# Patient Record
Sex: Female | Born: 1960 | Race: White | Hispanic: No | Marital: Single | Smoking: Current every day smoker
Health system: Southern US, Community
[De-identification: ages and names within clinical notes are randomized; demographics above are authoritative.]

## PROBLEM LIST (undated history)

## (undated) DIAGNOSIS — K219 Gastro-esophageal reflux disease without esophagitis: Secondary | ICD-10-CM

## (undated) DIAGNOSIS — D649 Anemia, unspecified: Secondary | ICD-10-CM

## (undated) DIAGNOSIS — Z8719 Personal history of other diseases of the digestive system: Secondary | ICD-10-CM

## (undated) DIAGNOSIS — J189 Pneumonia, unspecified organism: Secondary | ICD-10-CM

## (undated) DIAGNOSIS — J939 Pneumothorax, unspecified: Secondary | ICD-10-CM

## (undated) DIAGNOSIS — R079 Chest pain, unspecified: Secondary | ICD-10-CM

## (undated) DIAGNOSIS — F32A Depression, unspecified: Secondary | ICD-10-CM

## (undated) DIAGNOSIS — Z972 Presence of dental prosthetic device (complete) (partial): Secondary | ICD-10-CM

## (undated) DIAGNOSIS — I499 Cardiac arrhythmia, unspecified: Secondary | ICD-10-CM

## (undated) DIAGNOSIS — M1990 Unspecified osteoarthritis, unspecified site: Secondary | ICD-10-CM

## (undated) DIAGNOSIS — J40 Bronchitis, not specified as acute or chronic: Secondary | ICD-10-CM

## (undated) DIAGNOSIS — J449 Chronic obstructive pulmonary disease, unspecified: Secondary | ICD-10-CM

## (undated) DIAGNOSIS — R519 Headache, unspecified: Secondary | ICD-10-CM

## (undated) DIAGNOSIS — R011 Cardiac murmur, unspecified: Secondary | ICD-10-CM

## (undated) HISTORY — PX: LUNG LOBECTOMY: SHX167

## (undated) HISTORY — PX: OOPHORECTOMY: SHX86

## (undated) HISTORY — PX: ROTATOR CUFF REPAIR: SHX139

## (undated) HISTORY — PX: COLONOSCOPY: SHX174

## (undated) HISTORY — PX: APPENDECTOMY: SHX54

## (undated) HISTORY — PX: SHOULDER ARTHROSCOPY: SHX128

## (undated) HISTORY — DX: Chest pain, unspecified: R07.9

---

## 2016-12-03 ENCOUNTER — Other Ambulatory Visit: Payer: Self-pay | Admitting: Pain Medicine

## 2016-12-04 ENCOUNTER — Other Ambulatory Visit: Payer: Self-pay | Admitting: Pain Medicine

## 2016-12-04 DIAGNOSIS — G587 Mononeuritis multiplex: Secondary | ICD-10-CM

## 2016-12-04 DIAGNOSIS — G609 Hereditary and idiopathic neuropathy, unspecified: Secondary | ICD-10-CM

## 2016-12-10 ENCOUNTER — Ambulatory Visit
Admission: RE | Admit: 2016-12-10 | Discharge: 2016-12-10 | Disposition: A | Discharge status: Home / Self Care | Payer: Medicare Other | Source: Ambulatory Visit | Attending: Pain Medicine | Admitting: Pain Medicine

## 2016-12-10 ENCOUNTER — Encounter (INDEPENDENT_AMBULATORY_CARE_PROVIDER_SITE_OTHER): Payer: Self-pay

## 2016-12-10 DIAGNOSIS — G587 Mononeuritis multiplex: Secondary | ICD-10-CM | POA: Diagnosis present

## 2016-12-10 DIAGNOSIS — G609 Hereditary and idiopathic neuropathy, unspecified: Secondary | ICD-10-CM | POA: Diagnosis not present

## 2017-04-15 ENCOUNTER — Other Ambulatory Visit: Payer: Self-pay | Admitting: Family Medicine

## 2017-04-15 DIAGNOSIS — Z720 Tobacco use: Secondary | ICD-10-CM

## 2017-05-01 ENCOUNTER — Ambulatory Visit: Payer: Medicare Other

## 2017-10-06 ENCOUNTER — Other Ambulatory Visit: Payer: Self-pay

## 2017-10-06 ENCOUNTER — Emergency Department
Admission: EM | Admit: 2017-10-06 | Discharge: 2017-10-06 | Disposition: A | Discharge status: Home / Self Care | Payer: Medicare Other | Attending: Student in an Organized Health Care Education/Training Program | Admitting: Student in an Organized Health Care Education/Training Program

## 2017-10-06 ENCOUNTER — Emergency Department: Payer: Medicare Other

## 2017-10-06 ENCOUNTER — Encounter: Payer: Self-pay | Admitting: Emergency Medicine

## 2017-10-06 DIAGNOSIS — R509 Fever, unspecified: Secondary | ICD-10-CM | POA: Diagnosis not present

## 2017-10-06 DIAGNOSIS — J209 Acute bronchitis, unspecified: Secondary | ICD-10-CM | POA: Insufficient documentation

## 2017-10-06 DIAGNOSIS — R05 Cough: Secondary | ICD-10-CM | POA: Diagnosis present

## 2017-10-06 DIAGNOSIS — R0602 Shortness of breath: Secondary | ICD-10-CM | POA: Insufficient documentation

## 2017-10-06 DIAGNOSIS — J189 Pneumonia, unspecified organism: Secondary | ICD-10-CM | POA: Insufficient documentation

## 2017-10-06 DIAGNOSIS — R110 Nausea: Secondary | ICD-10-CM | POA: Diagnosis not present

## 2017-10-06 LAB — CBC WITH DIFFERENTIAL/PLATELET
Basophils Absolute: 0 10*3/uL (ref 0–0.1)
Basophils Relative: 0 %
EOS ABS: 0 10*3/uL (ref 0–0.7)
Eosinophils Relative: 0 %
HCT: 31.6 % — ABNORMAL LOW (ref 35.0–47.0)
Hemoglobin: 10.8 g/dL — ABNORMAL LOW (ref 12.0–16.0)
LYMPHS ABS: 0.7 10*3/uL — AB (ref 1.0–3.6)
LYMPHS PCT: 6 %
MCH: 26.5 pg (ref 26.0–34.0)
MCHC: 34 g/dL (ref 32.0–36.0)
MCV: 78 fL — AB (ref 80.0–100.0)
Monocytes Absolute: 0.8 10*3/uL (ref 0.2–0.9)
Monocytes Relative: 7 %
NEUTROS ABS: 10.3 10*3/uL — AB (ref 1.4–6.5)
NEUTROS PCT: 87 %
PLATELETS: 283 10*3/uL (ref 150–440)
RBC: 4.05 MIL/uL (ref 3.80–5.20)
RDW: 18.6 % — AB (ref 11.5–14.5)
WBC: 11.9 10*3/uL — AB (ref 3.6–11.0)

## 2017-10-06 LAB — COMPREHENSIVE METABOLIC PANEL
ALT: 13 U/L (ref 0–44)
AST: 32 U/L (ref 15–41)
Albumin: 3.6 g/dL (ref 3.5–5.0)
Alkaline Phosphatase: 116 U/L (ref 38–126)
Anion gap: 15 (ref 5–15)
BUN: 11 mg/dL (ref 6–20)
CHLORIDE: 95 mmol/L — AB (ref 98–111)
CO2: 26 mmol/L (ref 22–32)
CREATININE: 0.86 mg/dL (ref 0.44–1.00)
Calcium: 9.4 mg/dL (ref 8.9–10.3)
Glucose, Bld: 123 mg/dL — ABNORMAL HIGH (ref 70–99)
POTASSIUM: 3.8 mmol/L (ref 3.5–5.1)
SODIUM: 136 mmol/L (ref 135–145)
Total Bilirubin: 1.7 mg/dL — ABNORMAL HIGH (ref 0.3–1.2)
Total Protein: 7.4 g/dL (ref 6.5–8.1)

## 2017-10-06 MED ORDER — IPRATROPIUM-ALBUTEROL 0.5-2.5 (3) MG/3ML IN SOLN
3.0000 mL | Freq: Once | RESPIRATORY_TRACT | Status: AC
  Administered 2017-10-06: 3 mL via RESPIRATORY_TRACT
  Filled 2017-10-06: qty 3

## 2017-10-06 MED ORDER — SODIUM CHLORIDE 0.9 % IV BOLUS
1000.0000 mL | Freq: Once | INTRAVENOUS | Status: AC
  Administered 2017-10-06: 1000 mL via INTRAVENOUS

## 2017-10-06 MED ORDER — LEVOFLOXACIN 500 MG PO TABS: 500.0000 mg | ORAL_TABLET | Freq: Every day | ORAL | 0 refills | Status: AC

## 2017-10-06 MED ORDER — IBUPROFEN 600 MG PO TABS
600.0000 mg | ORAL_TABLET | Freq: Once | ORAL | Status: AC
  Administered 2017-10-06: 600 mg via ORAL
  Filled 2017-10-06: qty 1

## 2017-10-06 MED ORDER — IPRATROPIUM-ALBUTEROL 0.5-2.5 (3) MG/3ML IN SOLN: 3.0000 mL | RESPIRATORY_TRACT | 3 refills | Status: DC | PRN

## 2017-10-06 MED ORDER — LEVOFLOXACIN IN D5W 750 MG/150ML IV SOLN
750.0000 mg | Freq: Once | INTRAVENOUS | Status: AC
  Administered 2017-10-06: 750 mg via INTRAVENOUS
  Filled 2017-10-06: qty 150

## 2017-10-06 MED ORDER — PREDNISONE 20 MG PO TABS
60.0000 mg | ORAL_TABLET | Freq: Once | ORAL | Status: AC
  Administered 2017-10-06: 60 mg via ORAL
  Filled 2017-10-06: qty 3

## 2017-10-06 MED ORDER — PREDNISONE 20 MG PO TABS: 40.0000 mg | ORAL_TABLET | Freq: Every day | ORAL | 0 refills | Status: AC

## 2017-10-06 MED ORDER — LEVOFLOXACIN IN D5W 750 MG/150ML IV SOLN: 750.0000 mg | INTRAVENOUS | Status: DC

## 2017-10-06 NOTE — ED Provider Notes (Signed)
[facility] Emergency Department Provider Note    First MD Initiated Contact with Patient 10/06/17 1931     (approximate)  I have reviewed the triage vital signs and the nursing notes.   HISTORY  Chief Complaint Cough and Fever    HPI Tammy Maxwell is a 57 y.o. female with a history of bronchitis and 1 pack/day smoking history presents the ER for evaluation of thick productive cough and shortness of breath.  Has not had any vomiting.  Has had some nausea.  No diarrhea.  Does not wear home oxygen.  She is been able to tolerate oral hydration.  No recent antibiotics.  She ran out of nebulizer treatments several days ago.  No recent steroids.  Has had multiple recent sick contacts.    History reviewed. No pertinent past medical history. No family history on file. History reviewed. No pertinent surgical history. There are no active problems to display for this patient.     Prior to Admission medications   Medication Sig Start Date End Date Taking? Authorizing Provider  ipratropium-albuterol (DUONEB) 0.5-2.5 (3) MG/3ML SOLN Take 3 mLs by nebulization every 4 (four) hours as needed. 10/06/17   Willy Eddy, MD  levofloxacin (LEVAQUIN) 500 MG tablet Take 1 tablet (500 mg total) by mouth daily for 7 days. 10/06/17 10/13/17  Willy Eddy, MD  predniSONE (DELTASONE) 20 MG tablet Take 2 tablets (40 mg total) by mouth daily for 5 days. 10/06/17 10/11/17  Willy Eddy, MD    Allergies Penicillins    Social History Social History   Tobacco Use  . Smoking status: Not on file  Substance Use Topics  . Alcohol use: Not on file  . Drug use: Not on file    Review of Systems Patient denies headaches, rhinorrhea, blurry vision, numbness, shortness of breath, chest pain, edema, cough, abdominal pain, nausea, vomiting, diarrhea, dysuria, fevers, rashes or hallucinations unless otherwise stated above in  HPI. ____________________________________________   PHYSICAL EXAM:  VITAL SIGNS: Vitals:   10/06/17 1930 10/06/17 2000  BP: (!) 147/79 136/76  Pulse:    Resp: (!) 24 (!) 23  Temp:    SpO2:      Constitutional: Alert and oriented.  Eyes: Conjunctivae are normal.  Head: Atraumatic. Nose: No congestion/rhinnorhea. Mouth/Throat: Mucous membranes are moist.   Neck: No stridor. Painless ROM.  Cardiovascular: Normal rate, regular rhythm. Grossly normal heart sounds.  Good peripheral circulation. Respiratory: mild tachypnea with diffuse rhonchorus breathsounds, expiratory wheeze Gastrointestinal: Soft and nontender. No distention. No abdominal bruits. No CVA tenderness. Genitourinary: deferred Musculoskeletal: No lower extremity tenderness nor edema.  No joint effusions. Neurologic:  Normal speech and language. No gross focal neurologic deficits are appreciated. No facial droop Skin:  Skin is warm, dry and intact. No rash noted. Psychiatric: Mood and affect are normal. Speech and behavior are normal.  ____________________________________________   LABS (all labs ordered are listed, but only abnormal results are displayed)  Results for orders placed or performed during the hospital encounter of 10/06/17 (from the past 24 hour(s))  Comprehensive metabolic panel     Status: Abnormal   Collection Time: 10/06/17  4:37 PM  Result Value Ref Range   Sodium 136 135 - 145 mmol/L   Potassium 3.8 3.5 - 5.1 mmol/L   Chloride 95 (L) 98 - 111 mmol/L   CO2 26 22 - 32 mmol/L   Glucose, Bld 123 (H) 70 - 99 mg/dL   BUN 11 6 - 20 mg/dL   Creatinine, Ser  0.86 0.44 - 1.00 mg/dL   Calcium 9.4 8.9 - 41.3 mg/dL   Total Protein 7.4 6.5 - 8.1 g/dL   Albumin 3.6 3.5 - 5.0 g/dL   AST 32 15 - 41 U/L   ALT 13 0 - 44 U/L   Alkaline Phosphatase 116 38 - 126 U/L   Total Bilirubin 1.7 (H) 0.3 - 1.2 mg/dL   GFR calc non Af Amer >60 >60 mL/min   GFR calc Af Amer >60 >60 mL/min   Anion gap 15 5 - 15  CBC  with Differential     Status: Abnormal   Collection Time: 10/06/17  4:37 PM  Result Value Ref Range   WBC 11.9 (H) 3.6 - 11.0 K/uL   RBC 4.05 3.80 - 5.20 MIL/uL   Hemoglobin 10.8 (L) 12.0 - 16.0 g/dL   HCT 24.4 (L) 01.0 - 27.2 %   MCV 78.0 (L) 80.0 - 100.0 fL   MCH 26.5 26.0 - 34.0 pg   MCHC 34.0 32.0 - 36.0 g/dL   RDW 53.6 (H) 64.4 - 03.4 %   Platelets 283 150 - 440 K/uL   Neutrophils Relative % 87 %   Neutro Abs 10.3 (H) 1.4 - 6.5 K/uL   Lymphocytes Relative 6 %   Lymphs Abs 0.7 (L) 1.0 - 3.6 K/uL   Monocytes Relative 7 %   Monocytes Absolute 0.8 0.2 - 0.9 K/uL   Eosinophils Relative 0 %   Eosinophils Absolute 0.0 0 - 0.7 K/uL   Basophils Relative 0 %   Basophils Absolute 0.0 0 - 0.1 K/uL   ____________________________________________  EKG My review and personal interpretation at Time: 21:32   Indication: sob  Rate: 95  Rhythm: sinus Axis: normal Other: normal intervals, no stemi, non specific st abn ____________________________________________  RADIOLOGY  I personally reviewed all radiographic images ordered to evaluate for the above acute complaints and reviewed radiology reports and findings.  These findings were personally discussed with the patient.  Please see medical record for radiology report.  ____________________________________________   PROCEDURES  Procedure(s) performed:  Procedures    Critical Care performed: no ____________________________________________   INITIAL IMPRESSION / ASSESSMENT AND PLAN / ED COURSE  Pertinent labs & imaging results that were available during my care of the patient were reviewed by me and considered in my medical decision making (see chart for details).   DDX: Asthma, copd, CHF, pna, ptx, malignancy, Pe, anemia   Tammy Maxwell is a 57 y.o. who presents to the ED with cough and symptoms as described above.  Patient with low-grade temperature mild tachycardia but no significant hypoxia.  Given respiratory sounds  certainly concerning for pneumonia chest x-ray does show probable early bronchopneumonia.  Not consistent with PE.  Patient will be given steroids as well as nebulizer treatment and reassess.  No evidence of heart failure.  We will give IV fluids.  Patient with poor score of 45.  The patient will be placed on continuous pulse oximetry and telemetry for monitoring.  Laboratory evaluation will be sent to evaluate for the above complaints.     Clinical Course as of Oct 07 2134  Mon Oct 06, 2017  2120 Patient reassessed.  Feels much improved after nebulizer treatment.  Recheck temperature and she is elevated to 101.2.  Will give Motrin and reassess.  Patient tolerating oral hydration I do believe she will be stable and appropriate for outpatient follow-up.  Was ambulated and did not develop any hypoxia or shortness of breath.  States  that she feels much improved to be discharged home.  Encouraged her to stay for repeat vitals to ensure that her fever curve is downtrending.   [PR]    Clinical Course User Index [PR] Willy Eddy, MD     As part of my medical decision making, I reviewed the following data within the electronic MEDICAL RECORD NUMBER Nursing notes reviewed and incorporated, Labs reviewed, notes from prior ED visits.  ____________________________________________   FINAL CLINICAL IMPRESSION(S) / ED DIAGNOSES  Final diagnoses:  Community acquired pneumonia, unspecified laterality  Acute bronchitis, unspecified organism      NEW MEDICATIONS STARTED DURING THIS VISIT:  New Prescriptions   IPRATROPIUM-ALBUTEROL (DUONEB) 0.5-2.5 (3) MG/3ML SOLN    Take 3 mLs by nebulization every 4 (four) hours as needed.   LEVOFLOXACIN (LEVAQUIN) 500 MG TABLET    Take 1 tablet (500 mg total) by mouth daily for 7 days.   PREDNISONE (DELTASONE) 20 MG TABLET    Take 2 tablets (40 mg total) by mouth daily for 5 days.     Note:  This document was prepared using Dragon voice recognition software and  may include unintentional dictation errors.    Willy Eddy, MD 10/06/17 2136

## 2017-10-06 NOTE — Consult Note (Signed)
Pharmacy Antibiotic Note  Tammy Maxwell is a 57 y.o. female admitted on 10/06/2017 with pneumonia.  Pharmacy has been consulted for levofloxacin dosing.  Plan: Levofloxacin 750 mg IV every 24 hours.  Height: 5\' 8"  (172.7 cm) Weight: 116 lb (52.6 kg) IBW/kg (Calculated) : 63.9  Temp (24hrs), Avg:99.9 F (37.7 C), Min:99.9 F (37.7 C), Max:99.9 F (37.7 C)  Recent Labs  Lab 10/06/17 1637  WBC 11.9*  CREATININE 0.86    Estimated Creatinine Clearance: 61.4 mL/min (by C-G formula based on SCr of 0.86 mg/dL).    Allergies  Allergen Reactions  . Penicillins Anaphylaxis    Antimicrobials this admission: Levofloxacin 9/30 >>    Dose adjustments this admission:   Microbiology results:  BCx:   UCx:    Sputum:    MRSA PCR:   Thank you for allowing pharmacy to be a part of this patient's care.  Orinda Kenner 10/06/2017 8:13 PM

## 2017-10-06 NOTE — ED Triage Notes (Addendum)
Cough and fever x 9 days. Cough productive green.

## 2017-10-06 NOTE — ED Notes (Signed)
Pt reports a cough, intermittent fever.  cig smoker.  States coughing up yellow phlegm.  Pt also reports sob.  Pt also has chest pain.  States nausea.  Sx for 10 days.

## 2017-10-06 NOTE — ED Notes (Signed)
Pt drinking water.  Oxygen sats 93-94% with ambulation.

## 2019-01-05 ENCOUNTER — Ambulatory Visit: Payer: Medicare Other | Attending: Internal Medicine

## 2019-01-05 DIAGNOSIS — Z20822 Contact with and (suspected) exposure to covid-19: Secondary | ICD-10-CM

## 2019-01-06 LAB — NOVEL CORONAVIRUS, NAA: SARS-CoV-2, NAA: NOT DETECTED

## 2019-01-07 ENCOUNTER — Telehealth: Payer: Self-pay

## 2019-01-07 NOTE — Telephone Encounter (Signed)
Caller given negative result and verbalized understanding  

## 2019-08-11 ENCOUNTER — Encounter: Payer: Self-pay | Admitting: Ophthalmology

## 2019-08-17 ENCOUNTER — Other Ambulatory Visit: Payer: Self-pay

## 2019-08-17 ENCOUNTER — Other Ambulatory Visit
Admission: RE | Admit: 2019-08-17 | Discharge: 2019-08-17 | Disposition: A | Discharge status: Home / Self Care | Payer: Medicare Other | Source: Ambulatory Visit | Attending: Ophthalmology | Admitting: Ophthalmology

## 2019-08-17 DIAGNOSIS — Z01812 Encounter for preprocedural laboratory examination: Secondary | ICD-10-CM | POA: Diagnosis present

## 2019-08-17 DIAGNOSIS — Z20822 Contact with and (suspected) exposure to covid-19: Secondary | ICD-10-CM | POA: Diagnosis not present

## 2019-08-17 LAB — SARS CORONAVIRUS 2 (TAT 6-24 HRS): SARS Coronavirus 2: NEGATIVE

## 2019-08-19 ENCOUNTER — Ambulatory Visit: Payer: Medicare Other | Admitting: Certified Registered"

## 2019-08-19 ENCOUNTER — Other Ambulatory Visit: Payer: Self-pay

## 2019-08-19 ENCOUNTER — Encounter
Admission: RE | Disposition: A | Discharge status: Home / Self Care | Payer: Self-pay | Source: Home / Self Care | Attending: Ophthalmology

## 2019-08-19 ENCOUNTER — Ambulatory Visit
Admission: RE | Admit: 2019-08-19 | Discharge: 2019-08-19 | Disposition: A | Discharge status: Home / Self Care | Payer: Medicare Other | Attending: Ophthalmology | Admitting: Ophthalmology

## 2019-08-19 ENCOUNTER — Encounter: Payer: Self-pay | Admitting: Ophthalmology

## 2019-08-19 DIAGNOSIS — F329 Major depressive disorder, single episode, unspecified: Secondary | ICD-10-CM | POA: Insufficient documentation

## 2019-08-19 DIAGNOSIS — Z880 Allergy status to penicillin: Secondary | ICD-10-CM | POA: Insufficient documentation

## 2019-08-19 DIAGNOSIS — J449 Chronic obstructive pulmonary disease, unspecified: Secondary | ICD-10-CM | POA: Diagnosis not present

## 2019-08-19 DIAGNOSIS — Z7982 Long term (current) use of aspirin: Secondary | ICD-10-CM | POA: Diagnosis not present

## 2019-08-19 DIAGNOSIS — Z902 Acquired absence of lung [part of]: Secondary | ICD-10-CM | POA: Diagnosis not present

## 2019-08-19 DIAGNOSIS — F17200 Nicotine dependence, unspecified, uncomplicated: Secondary | ICD-10-CM | POA: Insufficient documentation

## 2019-08-19 DIAGNOSIS — K219 Gastro-esophageal reflux disease without esophagitis: Secondary | ICD-10-CM | POA: Insufficient documentation

## 2019-08-19 DIAGNOSIS — Z9981 Dependence on supplemental oxygen: Secondary | ICD-10-CM | POA: Diagnosis not present

## 2019-08-19 DIAGNOSIS — K449 Diaphragmatic hernia without obstruction or gangrene: Secondary | ICD-10-CM | POA: Diagnosis not present

## 2019-08-19 DIAGNOSIS — H2511 Age-related nuclear cataract, right eye: Secondary | ICD-10-CM | POA: Diagnosis not present

## 2019-08-19 DIAGNOSIS — M1990 Unspecified osteoarthritis, unspecified site: Secondary | ICD-10-CM | POA: Insufficient documentation

## 2019-08-19 DIAGNOSIS — Z79899 Other long term (current) drug therapy: Secondary | ICD-10-CM | POA: Diagnosis not present

## 2019-08-19 DIAGNOSIS — Z791 Long term (current) use of non-steroidal anti-inflammatories (NSAID): Secondary | ICD-10-CM | POA: Diagnosis not present

## 2019-08-19 HISTORY — DX: Personal history of other diseases of the digestive system: Z87.19

## 2019-08-19 HISTORY — DX: Anemia, unspecified: D64.9

## 2019-08-19 HISTORY — DX: Pneumothorax, unspecified: J93.9

## 2019-08-19 HISTORY — DX: Cardiac arrhythmia, unspecified: I49.9

## 2019-08-19 HISTORY — DX: Pneumonia, unspecified organism: J18.9

## 2019-08-19 HISTORY — DX: Bronchitis, not specified as acute or chronic: J40

## 2019-08-19 HISTORY — DX: Depression, unspecified: F32.A

## 2019-08-19 HISTORY — PX: CATARACT EXTRACTION W/PHACO: SHX586

## 2019-08-19 HISTORY — DX: Unspecified osteoarthritis, unspecified site: M19.90

## 2019-08-19 HISTORY — DX: Gastro-esophageal reflux disease without esophagitis: K21.9

## 2019-08-19 HISTORY — DX: Cardiac murmur, unspecified: R01.1

## 2019-08-19 HISTORY — DX: Chronic obstructive pulmonary disease, unspecified: J44.9

## 2019-08-19 SURGERY — PHACOEMULSIFICATION, CATARACT, WITH IOL INSERTION
Anesthesia: Monitor Anesthesia Care | Site: Eye | Laterality: Right

## 2019-08-19 MED ORDER — CARBACHOL 0.01 % IO SOLN
INTRAOCULAR | Status: DC | PRN
  Administered 2019-08-19: 0.5 mL via INTRAOCULAR

## 2019-08-19 MED ORDER — MOXIFLOXACIN HCL 0.5 % OP SOLN: 1.0000 [drp] | OPHTHALMIC | Status: DC | PRN

## 2019-08-19 MED ORDER — TETRACAINE HCL 0.5 % OP SOLN
OPHTHALMIC | Status: AC
  Administered 2019-08-19: 1 [drp] via OPHTHALMIC
  Filled 2019-08-19: qty 4

## 2019-08-19 MED ORDER — MOXIFLOXACIN HCL 0.5 % OP SOLN
OPHTHALMIC | Status: AC
  Filled 2019-08-19: qty 3

## 2019-08-19 MED ORDER — SODIUM HYALURONATE 10 MG/ML IO SOLN
INTRAOCULAR | Status: DC | PRN
  Administered 2019-08-19: 0.55 mL via INTRAOCULAR

## 2019-08-19 MED ORDER — MOXIFLOXACIN HCL 0.5 % OP SOLN
OPHTHALMIC | Status: DC | PRN
  Administered 2019-08-19: 0.2 mL via OPHTHALMIC

## 2019-08-19 MED ORDER — TETRACAINE HCL 0.5 % OP SOLN
1.0000 [drp] | OPHTHALMIC | Status: AC | PRN
  Administered 2019-08-19: 1 [drp] via OPHTHALMIC

## 2019-08-19 MED ORDER — LIDOCAINE HCL (PF) 4 % IJ SOLN
INTRAOCULAR | Status: DC | PRN
  Administered 2019-08-19: 4 mL via OPHTHALMIC

## 2019-08-19 MED ORDER — ARMC OPHTHALMIC DILATING DROPS
OPHTHALMIC | Status: AC
  Administered 2019-08-19: 1 via OPHTHALMIC
  Filled 2019-08-19: qty 0.5

## 2019-08-19 MED ORDER — SODIUM HYALURONATE 23 MG/ML IO SOLN
INTRAOCULAR | Status: DC | PRN
  Administered 2019-08-19: 0.6 mL via INTRAOCULAR

## 2019-08-19 MED ORDER — ARMC OPHTHALMIC DILATING DROPS
1.0000 "application " | OPHTHALMIC | Status: AC
  Administered 2019-08-19 (×2): 1 via OPHTHALMIC

## 2019-08-19 MED ORDER — SODIUM CHLORIDE 0.9 % IV SOLN: INTRAVENOUS | Status: DC

## 2019-08-19 MED ORDER — DEXMEDETOMIDINE HCL IN NACL 200 MCG/50ML IV SOLN
INTRAVENOUS | Status: DC | PRN
  Administered 2019-08-19: 20 ug via INTRAVENOUS

## 2019-08-19 MED ORDER — POVIDONE-IODINE 5 % OP SOLN
OPHTHALMIC | Status: DC | PRN
  Administered 2019-08-19: 1 via OPHTHALMIC

## 2019-08-19 MED ORDER — EPINEPHRINE PF 1 MG/ML IJ SOLN: INTRAOCULAR | Status: DC | PRN

## 2019-08-19 SURGICAL SUPPLY — 17 items
DISSECTOR HYDRO NUCLEUS 50X22 (MISCELLANEOUS) ×2 IMPLANT
GLOVE BIO SURGEON STRL SZ8 (GLOVE) ×2 IMPLANT
GLOVE BIOGEL M 6.5 STRL (GLOVE) ×2 IMPLANT
GLOVE SURG LX 7.5 STRW (GLOVE) ×1
GLOVE SURG LX STRL 7.5 STRW (GLOVE) ×1 IMPLANT
GOWN STRL REUS W/ TWL LRG LVL3 (GOWN DISPOSABLE) ×2 IMPLANT
GOWN STRL REUS W/TWL LRG LVL3 (GOWN DISPOSABLE) ×2
LABEL CATARACT MEDS ST (LABEL) ×2 IMPLANT
LENS IOL DIOP 21.5 (Intraocular Lens) ×2 IMPLANT
LENS IOL TECNIS MONO 21.5 (Intraocular Lens) ×1 IMPLANT
PACK CATARACT (MISCELLANEOUS) ×2 IMPLANT
PACK CATARACT KING (MISCELLANEOUS) ×2 IMPLANT
PACK EYE AFTER SURG (MISCELLANEOUS) ×2 IMPLANT
SOL BSS BAG (MISCELLANEOUS) ×2
SOLUTION BSS BAG (MISCELLANEOUS) ×1 IMPLANT
WATER STERILE IRR 250ML POUR (IV SOLUTION) ×2 IMPLANT
WIPE NON LINTING 3.25X3.25 (MISCELLANEOUS) ×2 IMPLANT

## 2019-08-19 NOTE — Anesthesia Postprocedure Evaluation (Signed)
Anesthesia Post Note  Patient: Tammy Maxwell  Procedure(s) Performed: CATARACT EXTRACTION PHACO AND INTRAOCULAR LENS PLACEMENT (IOC) RIGHT (Right Eye)  Patient location: Phase II. Anesthesia Type: MAC Level of consciousness: awake and alert Pain management: pain level controlled Vital Signs Assessment: post-procedure vital signs reviewed and stable Respiratory status: spontaneous breathing, nonlabored ventilation, respiratory function stable and patient connected to nasal cannula oxygen Cardiovascular status: stable and blood pressure returned to baseline Postop Assessment: no apparent nausea or vomiting Anesthetic complications: no   No complications documented.   Last Vitals:  Vitals:   08/19/19 0655 08/19/19 0857  BP: (!) 159/95 (!) 157/86  Pulse: 68 64  Resp: 18 16  Temp: 36.5 C (!) 36.4 C  SpO2: 97% 100%    Last Pain:  Vitals:   08/19/19 0857  TempSrc: Temporal  PainSc: 0-No pain                 Precious Haws Jaquelyn Sakamoto

## 2019-08-19 NOTE — H&P (Signed)

## 2019-08-19 NOTE — Discharge Instructions (Addendum)
Eye Surgery Discharge Instructions  Expect mild scratchy sensation or mild soreness. DO NOT RUB YOUR EYE!  The day of surgery: . Minimal physical activity, but bed rest is not required . No reading, computer work, or close hand work . No bending, lifting, or straining. . May watch TV  For 24 hours: . No driving, legal decisions, or alcoholic beverages . Safety precautions . Eat anything you prefer: It is better to start with liquids, then soup then solid foods. . Solar shield eyeglasses should be worn for comfort in the sunlight/patch while sleeping  Resume all regular medications including aspirin or Coumadin if these were discontinued prior to surgery. You may shower, bathe, shave, or wash your hair. Tylenol may be taken for mild discomfort. FOLLOW DR. Elmer Bales EYE DROP INSTRUCTION SHEET AS REVIEWED.  Call your doctor if you experience significant pain, nausea, or vomiting, fever > 101 or other signs of infection. [phone] or [phone] Specific instructions:   Follow-up Information    Tammy Crane, MD Follow up.   Specialty: Ophthalmology Why: 08/20/19 @ 3:10 pm [facility] OFFICE Contact information: [address] [place] [address] [phone]

## 2019-08-19 NOTE — Op Note (Signed)
OPERATIVE NOTE  Tammy Maxwell [id number] 08/19/2019   PREOPERATIVE DIAGNOSIS:  Nuclear sclerotic cataract right eye.  H25.11   POSTOPERATIVE DIAGNOSIS:    Nuclear sclerotic cataract right eye.     PROCEDURE:  Phacoemusification with posterior chamber intraocular lens placement of the right eye   LENS:   Implant Name Type Inv. Item Serial No. Manufacturer Lot No. LRB No. Used Action  LENS IOL DIOP 21.5 - [id number] Intraocular Lens LENS IOL DIOP 21.5 [id number] [employer]  Right 1 Implanted       Procedure(s) with comments: CATARACT EXTRACTION PHACO AND INTRAOCULAR LENS PLACEMENT (IOC) RIGHT (Right) - [place] 00:14.5 CDE 0.90 Fluid Pack lot # [id number]  DCB00 +21.5   ULTRASOUND TIME: 0 minutes 14 seconds.  CDE 0.90   SURGEON:  Willey Blade, MD, MPH  ANESTHESIOLOGIST: Anesthesiologist: Piscitello, Cleda Mccreedy, MD CRNA: Darliss Cheney, CRNA   ANESTHESIA:  Topical with tetracaine drops augmented with 1% preservative-free intracameral lidocaine.  ESTIMATED BLOOD LOSS: less than 1 mL.   COMPLICATIONS:  None.   DESCRIPTION OF PROCEDURE:  The patient was identified in the holding room and transported to the operating room and placed in the supine position under the operating microscope.  The right eye was identified as the operative eye and it was prepped and draped in the usual sterile ophthalmic fashion.   A 1.0 millimeter clear-corneal paracentesis was made at the 10:30 position. 0.5 ml of preservative-free 1% lidocaine with epinephrine was injected into the anterior chamber.  The anterior chamber was filled with Healon 5 viscoelastic.  A 2.4 millimeter keratome was used to make a near-clear corneal incision at the 8:00 position.  A curvilinear capsulorrhexis was made with a cystotome and capsulorrhexis forceps.  Balanced salt solution was used to hydrodissect and hydrodelineate the nucleus.   Phacoemulsification was then used in stop and chop fashion to remove the lens  nucleus and epinucleus.  The remaining cortex was then removed using the irrigation and aspiration handpiece. Healon was then placed into the capsular bag to distend it for lens placement.  A lens was then injected into the capsular bag.  The remaining viscoelastic was aspirated.   Wounds were hydrated with balanced salt solution.  The anterior chamber was inflated to a physiologic pressure with balanced salt solution.   Intracameral vigamox 0.1 mL undiluted was injected into the eye and a drop placed onto the ocular surface.  No wound leaks were noted.  The patient was taken to the recovery room in stable condition without complications of anesthesia or surgery  Willey Blade 08/19/2019, 8:53 AM

## 2019-08-19 NOTE — Anesthesia Preprocedure Evaluation (Signed)
Anesthesia Evaluation  Patient identified by MRN, date of birth, ID band Patient awake    Reviewed: Allergy & Precautions, H&P , NPO status , Patient's Chart, lab work & pertinent test results  History of Anesthesia Complications Negative for: history of anesthetic complications  Airway Mallampati: III  TM Distance: <3 FB Neck ROM: limited    Dental  (+) Chipped, Upper Dentures   Pulmonary shortness of breath and with exertion, pneumonia, COPD,  COPD inhaler and oxygen dependent,    Pulmonary exam normal        Cardiovascular Exercise Tolerance: Good Normal cardiovascular exam+ dysrhythmias + Valvular Problems/Murmurs      Neuro/Psych PSYCHIATRIC DISORDERS negative neurological ROS     GI/Hepatic Neg liver ROS, hiatal hernia, GERD  ,  Endo/Other  negative endocrine ROS  Renal/GU      Musculoskeletal  (+) Arthritis ,   Abdominal   Peds  Hematology negative hematology ROS (+)   Anesthesia Other Findings Past Medical History: No date: Anemia No date: Arthritis No date: Bronchitis No date: COPD (chronic obstructive pulmonary disease) (HCC) No date: Depression No date: Dysrhythmia No date: GERD (gastroesophageal reflux disease) No date: Heart murmur No date: History of hiatal hernia No date: Pneumonia No date: Pneumothorax  Past Surgical History: No date: COLONOSCOPY No date: LUNG LOBECTOMY; Left No date: OOPHORECTOMY No date: ROTATOR CUFF REPAIR No date: SHOULDER ARTHROSCOPY; Right  BMI    Body Mass Index: 17.64 kg/m      Reproductive/Obstetrics negative OB ROS                             Anesthesia Physical Anesthesia Plan  ASA: IV  Anesthesia Plan: MAC   Post-op Pain Management:    Induction: Intravenous  PONV Risk Score and Plan:   Airway Management Planned: Natural Airway and Nasal Cannula  Additional Equipment:   Intra-op Plan:   Post-operative Plan:    Informed Consent: I have reviewed the patients History and Physical, chart, labs and discussed the procedure including the risks, benefits and alternatives for the proposed anesthesia with the patient or authorized representative who has indicated his/her understanding and acceptance.     Dental Advisory Given  Plan Discussed with: Anesthesiologist, CRNA and Surgeon  Anesthesia Plan Comments: (Patient consented for risks of anesthesia including but not limited to:  - adverse reactions to medications - damage to eyes, teeth, lips or other oral mucosa - nerve damage due to positioning  - sore throat or hoarseness - Damage to heart, brain, nerves, lungs, other parts of body or loss of life  Patient voiced understanding.)        Anesthesia Quick Evaluation

## 2019-08-19 NOTE — Transfer of Care (Signed)
Immediate Anesthesia Transfer of Care Note  Patient: Haylea Deam  Procedure(s) Performed: CATARACT EXTRACTION PHACO AND INTRAOCULAR LENS PLACEMENT (IOC) RIGHT (Right Eye)  Patient Location: Short Stay  Anesthesia Type:MAC  Level of Consciousness: awake  Airway & Oxygen Therapy: Patient Spontanous Breathing  Post-op Assessment: Report given to RN and Post -op Vital signs reviewed and stable  Post vital signs: Reviewed and stable  Last Vitals:  Vitals Value Taken Time  BP 157/86 08/19/19 0857  Temp 36.4 C 08/19/19 0857  Pulse 64 08/19/19 0857  Resp 16 08/19/19 0857  SpO2 100 % 08/19/19 0857    Last Pain:  Vitals:   08/19/19 0857  TempSrc: Temporal  PainSc: 0-No pain         Complications: No complications documented.

## 2019-09-15 ENCOUNTER — Other Ambulatory Visit: Payer: Self-pay

## 2019-09-15 ENCOUNTER — Encounter: Payer: Self-pay | Admitting: Ophthalmology

## 2019-09-16 ENCOUNTER — Other Ambulatory Visit
Admission: RE | Admit: 2019-09-16 | Discharge: 2019-09-16 | Disposition: A | Discharge status: Home / Self Care | Payer: Medicare Other | Source: Ambulatory Visit | Attending: Ophthalmology | Admitting: Ophthalmology

## 2019-09-16 DIAGNOSIS — Z01812 Encounter for preprocedural laboratory examination: Secondary | ICD-10-CM | POA: Diagnosis present

## 2019-09-16 DIAGNOSIS — Z20822 Contact with and (suspected) exposure to covid-19: Secondary | ICD-10-CM | POA: Diagnosis not present

## 2019-09-16 NOTE — Discharge Instructions (Signed)

## 2019-09-17 LAB — SARS CORONAVIRUS 2 (TAT 6-24 HRS): SARS Coronavirus 2: NEGATIVE

## 2019-09-20 ENCOUNTER — Ambulatory Visit: Payer: Medicare Other | Admitting: Anesthesiology

## 2019-09-20 ENCOUNTER — Ambulatory Visit
Admission: RE | Admit: 2019-09-20 | Discharge: 2019-09-20 | Disposition: A | Discharge status: Home / Self Care | Payer: Medicare Other | Attending: Ophthalmology | Admitting: Ophthalmology

## 2019-09-20 ENCOUNTER — Encounter: Payer: Self-pay | Admitting: Ophthalmology

## 2019-09-20 ENCOUNTER — Other Ambulatory Visit: Payer: Self-pay

## 2019-09-20 ENCOUNTER — Encounter
Admission: RE | Disposition: A | Discharge status: Home / Self Care | Payer: Self-pay | Source: Home / Self Care | Attending: Ophthalmology

## 2019-09-20 DIAGNOSIS — F329 Major depressive disorder, single episode, unspecified: Secondary | ICD-10-CM | POA: Diagnosis not present

## 2019-09-20 DIAGNOSIS — Z9981 Dependence on supplemental oxygen: Secondary | ICD-10-CM | POA: Diagnosis not present

## 2019-09-20 DIAGNOSIS — Z880 Allergy status to penicillin: Secondary | ICD-10-CM | POA: Insufficient documentation

## 2019-09-20 DIAGNOSIS — Z7982 Long term (current) use of aspirin: Secondary | ICD-10-CM | POA: Insufficient documentation

## 2019-09-20 DIAGNOSIS — J449 Chronic obstructive pulmonary disease, unspecified: Secondary | ICD-10-CM | POA: Diagnosis not present

## 2019-09-20 DIAGNOSIS — K219 Gastro-esophageal reflux disease without esophagitis: Secondary | ICD-10-CM | POA: Insufficient documentation

## 2019-09-20 DIAGNOSIS — M4722 Other spondylosis with radiculopathy, cervical region: Secondary | ICD-10-CM | POA: Insufficient documentation

## 2019-09-20 DIAGNOSIS — F17200 Nicotine dependence, unspecified, uncomplicated: Secondary | ICD-10-CM | POA: Insufficient documentation

## 2019-09-20 DIAGNOSIS — Z79899 Other long term (current) drug therapy: Secondary | ICD-10-CM | POA: Insufficient documentation

## 2019-09-20 DIAGNOSIS — H2512 Age-related nuclear cataract, left eye: Secondary | ICD-10-CM | POA: Insufficient documentation

## 2019-09-20 HISTORY — PX: CATARACT EXTRACTION W/PHACO: SHX586

## 2019-09-20 HISTORY — DX: Presence of dental prosthetic device (complete) (partial): Z97.2

## 2019-09-20 SURGERY — PHACOEMULSIFICATION, CATARACT, WITH IOL INSERTION
Anesthesia: Monitor Anesthesia Care | Site: Eye | Laterality: Left

## 2019-09-20 MED ORDER — LACTATED RINGERS IV SOLN: INTRAVENOUS | Status: DC

## 2019-09-20 MED ORDER — MIDAZOLAM HCL 2 MG/2ML IJ SOLN
INTRAMUSCULAR | Status: DC | PRN
  Administered 2019-09-20 (×2): 1 mg via INTRAVENOUS

## 2019-09-20 MED ORDER — SODIUM HYALURONATE 10 MG/ML IO SOLN
INTRAOCULAR | Status: DC | PRN
  Administered 2019-09-20: 0.55 mL via INTRAOCULAR

## 2019-09-20 MED ORDER — EPINEPHRINE PF 1 MG/ML IJ SOLN
INTRAOCULAR | Status: DC | PRN
  Administered 2019-09-20: 42 mL via OPHTHALMIC

## 2019-09-20 MED ORDER — MOXIFLOXACIN HCL 0.5 % OP SOLN
OPHTHALMIC | Status: DC | PRN
  Administered 2019-09-20: 0.2 mL via OPHTHALMIC

## 2019-09-20 MED ORDER — FENTANYL CITRATE (PF) 100 MCG/2ML IJ SOLN
INTRAMUSCULAR | Status: DC | PRN
  Administered 2019-09-20: 50 ug via INTRAVENOUS

## 2019-09-20 MED ORDER — LIDOCAINE HCL (PF) 2 % IJ SOLN
INTRAOCULAR | Status: DC | PRN
  Administered 2019-09-20: 1 mL via INTRAOCULAR

## 2019-09-20 MED ORDER — SODIUM HYALURONATE 23 MG/ML IO SOLN
INTRAOCULAR | Status: DC | PRN
  Administered 2019-09-20: 0.6 mL via INTRAOCULAR

## 2019-09-20 MED ORDER — TETRACAINE HCL 0.5 % OP SOLN
1.0000 [drp] | OPHTHALMIC | Status: DC | PRN
  Administered 2019-09-20 (×3): 1 [drp] via OPHTHALMIC

## 2019-09-20 MED ORDER — ARMC OPHTHALMIC DILATING DROPS
1.0000 "application " | OPHTHALMIC | Status: DC | PRN
  Administered 2019-09-20 (×3): 1 via OPHTHALMIC

## 2019-09-20 SURGICAL SUPPLY — 20 items
CANNULA ANT/CHMB 27G (MISCELLANEOUS) ×2 IMPLANT
CANNULA ANT/CHMB 27GA (MISCELLANEOUS) ×4 IMPLANT
DISSECTOR HYDRO NUCLEUS 50X22 (MISCELLANEOUS) ×2 IMPLANT
GLOVE SURG LX 7.5 STRW (GLOVE) ×1
GLOVE SURG LX STRL 7.5 STRW (GLOVE) ×1 IMPLANT
GLOVE SURG SYN 8.5  E (GLOVE) ×1
GLOVE SURG SYN 8.5 E (GLOVE) ×1 IMPLANT
GLOVE SURG SYN 8.5 PF PI (GLOVE) ×1 IMPLANT
GOWN STRL REUS W/ TWL LRG LVL3 (GOWN DISPOSABLE) ×2 IMPLANT
GOWN STRL REUS W/TWL LRG LVL3 (GOWN DISPOSABLE) ×4
LENS IOL DIOP 21.5 (Intraocular Lens) ×2 IMPLANT
LENS IOL TECNIS MONO 21.5 (Intraocular Lens) IMPLANT
MARKER SKIN DUAL TIP RULER LAB (MISCELLANEOUS) ×2 IMPLANT
PACK DR. KING ARMS (PACKS) ×2 IMPLANT
PACK EYE AFTER SURG (MISCELLANEOUS) ×2 IMPLANT
PACK OPTHALMIC (MISCELLANEOUS) ×2 IMPLANT
SYR 3ML LL SCALE MARK (SYRINGE) ×2 IMPLANT
SYR TB 1ML LUER SLIP (SYRINGE) ×2 IMPLANT
WATER STERILE IRR 250ML POUR (IV SOLUTION) ×2 IMPLANT
WIPE NON LINTING 3.25X3.25 (MISCELLANEOUS) ×2 IMPLANT

## 2019-09-20 NOTE — Op Note (Signed)
OPERATIVE NOTE  Jamal Collar [id number] 09/20/2019   PREOPERATIVE DIAGNOSIS:  Nuclear sclerotic cataract left eye.  H25.12   POSTOPERATIVE DIAGNOSIS:    Nuclear sclerotic cataract left eye.     PROCEDURE:  Phacoemusification with posterior chamber intraocular lens placement of the left eye   LENS:   Implant Name Type Inv. Item Serial No. Manufacturer Lot No. LRB No. Used Action  LENS IOL DIOP 21.5 - [id number] Intraocular Lens LENS IOL DIOP 21.5 [id number]   Left 1 Implanted      Procedure(s): CATARACT EXTRACTION PHACO AND INTRAOCULAR LENS PLACEMENT (IOC) LEFT 1.27  00:13.7 (Left)  DCB00 +21.0   ULTRASOUND TIME: 0 minutes 13 seconds.  CDE 1.27   SURGEON:  Willey Blade, MD, MPH   ANESTHESIA:  Topical with tetracaine drops augmented with 1% preservative-free intracameral lidocaine.  ESTIMATED BLOOD LOSS: <1 mL   COMPLICATIONS:  None.   DESCRIPTION OF PROCEDURE:  The patient was identified in the holding room and transported to the operating room and placed in the supine position under the operating microscope.  The left eye was identified as the operative eye and it was prepped and draped in the usual sterile ophthalmic fashion.   A 1.0 millimeter clear-corneal paracentesis was made at the 5:00 position. 0.5 ml of preservative-free 1% lidocaine with epinephrine was injected into the anterior chamber.  The anterior chamber was filled with Healon 5 viscoelastic.  A 2.4 millimeter keratome was used to make a near-clear corneal incision at the 2:00 position.  A curvilinear capsulorrhexis was made with a cystotome and capsulorrhexis forceps.  Balanced salt solution was used to hydrodissect and hydrodelineate the nucleus.   Phacoemulsification was then used in stop and chop fashion to remove the lens nucleus and epinucleus.  The remaining cortex was then removed using the irrigation and aspiration handpiece. Healon was then placed into the capsular bag to distend it for lens  placement.  A lens was then injected into the capsular bag.  The remaining viscoelastic was aspirated.   Wounds were hydrated with balanced salt solution.  The anterior chamber was inflated to a physiologic pressure with balanced salt solution.  Intracameral vigamox 0.1 mL undiltued was injected into the eye and a drop placed onto the ocular surface.  No wound leaks were noted.  The patient was taken to the recovery room in stable condition without complications of anesthesia or surgery  Willey Blade 09/20/2019, 12:25 PM

## 2019-09-20 NOTE — H&P (Signed)

## 2019-09-20 NOTE — Anesthesia Postprocedure Evaluation (Signed)
Anesthesia Post Note  Patient: Tammy Maxwell  Procedure(s) Performed: CATARACT EXTRACTION PHACO AND INTRAOCULAR LENS PLACEMENT (IOC) LEFT 1.27  00:13.7 (Left Eye)     Patient location during evaluation: PACU Anesthesia Type: MAC Level of consciousness: awake and alert Pain management: pain level controlled Vital Signs Assessment: post-procedure vital signs reviewed and stable Respiratory status: spontaneous breathing Cardiovascular status: stable Postop Assessment: no headache Anesthetic complications: no   No complications documented.  Gillian Scarce

## 2019-09-20 NOTE — Anesthesia Preprocedure Evaluation (Signed)
Anesthesia Evaluation  Patient identified by MRN, date of birth, ID band Patient awake    Reviewed: Allergy & Precautions, H&P , NPO status , Patient's Chart, lab work & pertinent test results  Airway Mallampati: II  TM Distance: >3 FB Neck ROM: full    Dental no notable dental hx.    Pulmonary COPD,  oxygen dependent, Current Smoker,    Pulmonary exam normal        Cardiovascular Normal cardiovascular exam Rhythm:regular Rate:Normal     Neuro/Psych    GI/Hepatic Neg liver ROS, Medicated,  Endo/Other  negative endocrine ROS  Renal/GU negative Renal ROS  negative genitourinary   Musculoskeletal   Abdominal   Peds  Hematology  (+) Blood dyscrasia, anemia ,   Anesthesia Other Findings   Reproductive/Obstetrics                             Anesthesia Physical Anesthesia Plan  ASA: III  Anesthesia Plan: MAC   Post-op Pain Management:    Induction:   PONV Risk Score and Plan:   Airway Management Planned:   Additional Equipment:   Intra-op Plan:   Post-operative Plan:   Informed Consent: I have reviewed the patients History and Physical, chart, labs and discussed the procedure including the risks, benefits and alternatives for the proposed anesthesia with the patient or authorized representative who has indicated his/her understanding and acceptance.       Plan Discussed with:   Anesthesia Plan Comments:         Anesthesia Quick Evaluation

## 2019-09-20 NOTE — Anesthesia Procedure Notes (Signed)
Procedure Name: MAC Date/Time: 09/20/2019 12:07 PM Performed by: Jeannene Patella, CRNA Pre-anesthesia Checklist: Patient identified, Emergency Drugs available, Suction available, Timeout performed and Patient being monitored Patient Re-evaluated:Patient Re-evaluated prior to induction Oxygen Delivery Method: Nasal cannula Placement Confirmation: positive ETCO2

## 2019-09-20 NOTE — Transfer of Care (Signed)
Immediate Anesthesia Transfer of Care Note  Patient: Tammy Maxwell  Procedure(s) Performed: CATARACT EXTRACTION PHACO AND INTRAOCULAR LENS PLACEMENT (IOC) LEFT 1.27  00:13.7 (Left Eye)  Patient Location: PACU  Anesthesia Type: MAC  Level of Consciousness: awake, alert  and patient cooperative  Airway and Oxygen Therapy: Patient Spontanous Breathing and Patient connected to supplemental oxygen  Post-op Assessment: Post-op Vital signs reviewed, Patient's Cardiovascular Status Stable, Respiratory Function Stable, Patent Airway and No signs of Nausea or vomiting  Post-op Vital Signs: Reviewed and stable  Complications: No complications documented.

## 2019-09-21 ENCOUNTER — Encounter: Payer: Self-pay | Admitting: Ophthalmology

## 2019-12-07 ENCOUNTER — Encounter: Payer: Self-pay | Admitting: General Practice

## 2020-01-27 ENCOUNTER — Other Ambulatory Visit: Payer: Self-pay | Admitting: Internal Medicine

## 2020-01-27 ENCOUNTER — Other Ambulatory Visit: Payer: Self-pay

## 2020-01-27 ENCOUNTER — Ambulatory Visit (INDEPENDENT_AMBULATORY_CARE_PROVIDER_SITE_OTHER): Payer: Medicare Other | Admitting: Gastroenterology

## 2020-01-27 ENCOUNTER — Encounter: Payer: Self-pay | Admitting: Gastroenterology

## 2020-01-27 VITALS — BP 138/82 | HR 80 | Wt 113.2 lb

## 2020-01-27 DIAGNOSIS — K5900 Constipation, unspecified: Secondary | ICD-10-CM

## 2020-01-27 DIAGNOSIS — R1312 Dysphagia, oropharyngeal phase: Secondary | ICD-10-CM

## 2020-01-27 NOTE — Progress Notes (Signed)
Samples of Linzess given in office today for a week. Bowel prep Clenpiq given.

## 2020-01-27 NOTE — Progress Notes (Signed)
Sample of bowel prep given in office.

## 2020-01-27 NOTE — Progress Notes (Signed)
Wyline Mood MD, MRCP(U.K) [address]  [address]  [place], [place] [address]  Main: [phone]  Fax: [phone]   Gastroenterology Consultation  Referring Provider:     Marliss Coots, MD Primary Care Physician:  Marliss Coots, MD Primary Gastroenterologist:  Dr. Wyline Mood  Reason for Consultation:     Difficulty swallowing         HPI:   Tammy Maxwell is a 60 y.o. y/o female referred for difficulty swallowing .   She states that she has had issues with swallowing her entire life and gradually been getting worse over the past few years. Points to her neck and states that the food gets stuck along with liquids at times. Sometimes she needs to gag. She says that she also has issues with coughing at times during her meals. She has noticed a change in the tone of her voice. She does have weakness in her upper limbs which has been attributed to cervical spondylosis. She also states that she has had difficulty walking at times and has tripped. She complains of constipation and does not have a bowel movement for about a week ongoing for many years but recently worse. Last colonoscopy was over 8 years back. No family history of colon cancer or polyps. She does not have any weight loss but says she has lost a lot of muscle mass  Cervical MRI 11/13/15 is notable for cervical spondylosis, most prominent at C5/6 where there is mild spinal canal stenosis and marked narrowing of bilateral neural foramen   Past Medical History:  Diagnosis Date  . Anemia   . Arthritis   . Bronchitis   . COPD (chronic obstructive pulmonary disease) (HCC)   . Depression   . Dysrhythmia   . GERD (gastroesophageal reflux disease)   . Heart murmur   . History of hiatal hernia   . Pneumonia   . Pneumothorax   . Wears dentures    full upper    Past Surgical History:  Procedure Laterality Date  . CATARACT EXTRACTION W/PHACO Right 08/19/2019   Procedure: CATARACT EXTRACTION PHACO AND INTRAOCULAR LENS  PLACEMENT (IOC) RIGHT;  Surgeon: Nevada Crane, MD;  Location: [facility];  Service: Ophthalmology;  Laterality: Right;  [place] 00:14.5 CDE 0.90 Fluid Pack lot # [id number]  . CATARACT EXTRACTION W/PHACO Left 09/20/2019   Procedure: CATARACT EXTRACTION PHACO AND INTRAOCULAR LENS PLACEMENT (IOC) LEFT 1.27  00:13.7;  Surgeon: Nevada Crane, MD;  Location: [facility];  Service: Ophthalmology;  Laterality: Left;  . COLONOSCOPY    . LUNG LOBECTOMY Left   . OOPHORECTOMY    . ROTATOR CUFF REPAIR    . SHOULDER ARTHROSCOPY Right     Prior to Admission medications   Medication Sig Start Date End Date Taking? Authorizing Provider  aspirin 81 MG chewable tablet Chew 81 mg by mouth daily.    [provider]  budesonide-formoterol (SYMBICORT) 160-4.5 MCG/ACT inhaler Inhale 2 puffs into the lungs 2 (two) times daily.    [provider]  buPROPion (WELLBUTRIN SR) 150 MG 12 hr tablet Take 150 mg by mouth 2 (two) times daily.    [provider]  clonazePAM (KLONOPIN) 1 MG tablet Take 1 mg by mouth at bedtime.    [provider]  IPRATROPIUM BROMIDE HFA IN Inhale 2 puffs into the lungs 4 (four) times daily.    [provider]  meloxicam (MOBIC) 15 MG tablet Take 15 mg by mouth at bedtime.  [provider]  omeprazole (PRILOSEC) 20 MG capsule Take 20 mg by mouth 2 (two) times daily before a meal.    [provider]  PARoxetine (PAXIL) 20 MG tablet Take 20 mg by mouth at bedtime.    [provider]  QUEtiapine (SEROQUEL) 400 MG tablet Take 400 mg by mouth at bedtime.    [provider]  SUMAtriptan (IMITREX) 100 MG tablet Take 100 mg by mouth every 2 (two) hours as needed for migraine. May repeat in 2 hours if headache persists or recurs.    [provider]  traMADol (ULTRAM) 50 MG tablet Take 50 mg by mouth 4 (four) times daily.    [provider]    No family history on file.   Social History    Tobacco Use  . Smoking status: Current Every Day Smoker    Packs/day: 0.50    Types: Cigarettes  . Smokeless tobacco: Never Used  Vaping Use  . Vaping Use: Never used  Substance Use Topics  . Alcohol use: Not Currently    Allergies as of 01/27/2020 - Review Complete 01/27/2020  Allergen Reaction Noted  . Penicillins Anaphylaxis 10/06/2017    Review of Systems:    All systems reviewed and negative except where noted in HPI.   Physical Exam:  BP 138/82 (BP Location: Left Arm, Patient Position: Sitting, Cuff Size: Normal)   Pulse 80   Wt 113 lb 3.2 oz (51.3 kg)   BMI 17.21 kg/m  No LMP recorded. Patient is postmenopausal. Psych:  Alert and cooperative. Normal mood and affect. General:   Alert,  Well-developed, well-nourished, pleasant and cooperative in NAD Head:  Normocephalic and atraumatic. Eyes:  Sclera clear, no icterus.   Conjunctiva pink. Ears:  Normal auditory acuity. Lungs:  Respirations even and unlabored.  Clear throughout to auscultation.   No wheezes, crackles, or rhonchi. No acute distress. Heart:  Regular rate and rhythm; no murmurs, clicks, rubs, or gallops. Abdomen:  Normal bowel sounds.  No bruits.  Soft, non-tender and non-distended without masses, hepatosplenomegaly or hernias noted.  No guarding or rebound tenderness.    Neurologic:  Alert and oriented x3;   Psych:  Alert and cooperative. Normal mood and affect.  Imaging Studies: No results found.  Assessment and Plan:   Tammy Maxwell is a 60 y.o. y/o female has been referred for difficulty swallowing longstanding for many years that is gradually getting worse. In addition has developed state we are constipation which to has been ongoing for many years. History suggestive of a degree of oropharyngeal dysphagia as well. She does have generalized decrease in power in the upper extremities, she states she does trip while she walks, noticed a change of voice and has issues with balance as well. All  features concerning for a generalized muscular disorder.  Plan  1. EGD to evaluate dysphagia and colonoscopy for evaluation of constipation 2. Commence on Trulance 3 mg daily samples be provided for a week and if it works we will call for getting a 46-month prescription 3. I will obtain a modified barium swallow to rule out oropharyngeal dysphagia.  I have discussed alternative options, risks & benefits,  which include, but are not limited to, bleeding, infection, perforation,respiratory complication & drug reaction.  The patient agrees with this plan & written consent will be obtained.     Follow up in 8 weeks   Dr Wyline Mood MD,MRCP(U.K)

## 2020-01-31 DIAGNOSIS — M542 Cervicalgia: Secondary | ICD-10-CM | POA: Insufficient documentation

## 2020-01-31 DIAGNOSIS — M4722 Other spondylosis with radiculopathy, cervical region: Secondary | ICD-10-CM | POA: Insufficient documentation

## 2020-02-11 ENCOUNTER — Ambulatory Visit: Admission: RE | Admit: 2020-02-11 | Payer: Medicare Other | Source: Ambulatory Visit

## 2020-02-11 ENCOUNTER — Other Ambulatory Visit
Admission: RE | Admit: 2020-02-11 | Discharge: 2020-02-11 | Disposition: A | Discharge status: Home / Self Care | Payer: Medicare Other | Source: Ambulatory Visit | Attending: Gastroenterology | Admitting: Gastroenterology

## 2020-02-11 ENCOUNTER — Other Ambulatory Visit: Payer: Self-pay

## 2020-02-11 DIAGNOSIS — Z01812 Encounter for preprocedural laboratory examination: Secondary | ICD-10-CM | POA: Diagnosis present

## 2020-02-11 DIAGNOSIS — Z20822 Contact with and (suspected) exposure to covid-19: Secondary | ICD-10-CM | POA: Diagnosis not present

## 2020-02-12 LAB — SARS CORONAVIRUS 2 (TAT 6-24 HRS): SARS Coronavirus 2: NEGATIVE

## 2020-02-15 ENCOUNTER — Encounter: Payer: Self-pay | Admitting: Gastroenterology

## 2020-02-15 ENCOUNTER — Ambulatory Visit
Admission: RE | Admit: 2020-02-15 | Discharge: 2020-02-15 | Disposition: A | Discharge status: Home / Self Care | Payer: Medicare Other | Attending: Gastroenterology | Admitting: Gastroenterology

## 2020-02-15 ENCOUNTER — Ambulatory Visit: Payer: Medicare Other | Admitting: Anesthesiology

## 2020-02-15 ENCOUNTER — Telehealth: Payer: Self-pay

## 2020-02-15 ENCOUNTER — Other Ambulatory Visit: Payer: Self-pay

## 2020-02-15 ENCOUNTER — Encounter
Admission: RE | Disposition: A | Discharge status: Home / Self Care | Payer: Self-pay | Source: Home / Self Care | Attending: Gastroenterology

## 2020-02-15 DIAGNOSIS — B3781 Candidal esophagitis: Secondary | ICD-10-CM | POA: Diagnosis not present

## 2020-02-15 DIAGNOSIS — Z791 Long term (current) use of non-steroidal anti-inflammatories (NSAID): Secondary | ICD-10-CM | POA: Diagnosis not present

## 2020-02-15 DIAGNOSIS — K2100 Gastro-esophageal reflux disease with esophagitis, without bleeding: Secondary | ICD-10-CM | POA: Insufficient documentation

## 2020-02-15 DIAGNOSIS — Z7951 Long term (current) use of inhaled steroids: Secondary | ICD-10-CM | POA: Insufficient documentation

## 2020-02-15 DIAGNOSIS — K5901 Slow transit constipation: Secondary | ICD-10-CM | POA: Diagnosis present

## 2020-02-15 DIAGNOSIS — J449 Chronic obstructive pulmonary disease, unspecified: Secondary | ICD-10-CM | POA: Diagnosis not present

## 2020-02-15 DIAGNOSIS — Z7982 Long term (current) use of aspirin: Secondary | ICD-10-CM | POA: Diagnosis not present

## 2020-02-15 DIAGNOSIS — Z880 Allergy status to penicillin: Secondary | ICD-10-CM | POA: Insufficient documentation

## 2020-02-15 DIAGNOSIS — K5900 Constipation, unspecified: Secondary | ICD-10-CM | POA: Diagnosis not present

## 2020-02-15 DIAGNOSIS — K317 Polyp of stomach and duodenum: Secondary | ICD-10-CM | POA: Diagnosis not present

## 2020-02-15 DIAGNOSIS — F17210 Nicotine dependence, cigarettes, uncomplicated: Secondary | ICD-10-CM | POA: Diagnosis not present

## 2020-02-15 DIAGNOSIS — R1312 Dysphagia, oropharyngeal phase: Secondary | ICD-10-CM

## 2020-02-15 HISTORY — PX: COLONOSCOPY WITH PROPOFOL: SHX5780

## 2020-02-15 HISTORY — PX: ESOPHAGOGASTRODUODENOSCOPY (EGD) WITH PROPOFOL: SHX5813

## 2020-02-15 HISTORY — DX: Headache, unspecified: R51.9

## 2020-02-15 LAB — KOH PREP

## 2020-02-15 SURGERY — COLONOSCOPY WITH PROPOFOL
Anesthesia: General

## 2020-02-15 MED ORDER — LIDOCAINE HCL (PF) 2 % IJ SOLN
INTRAMUSCULAR | Status: AC
  Filled 2020-02-15: qty 5

## 2020-02-15 MED ORDER — LIDOCAINE 2% (20 MG/ML) 5 ML SYRINGE
INTRAMUSCULAR | Status: DC | PRN
  Administered 2020-02-15: 100 mg via INTRAVENOUS

## 2020-02-15 MED ORDER — GLYCOPYRROLATE 0.2 MG/ML IJ SOLN
INTRAMUSCULAR | Status: DC | PRN
  Administered 2020-02-15: .2 mg via INTRAVENOUS

## 2020-02-15 MED ORDER — PROPOFOL 500 MG/50ML IV EMUL
INTRAVENOUS | Status: AC
  Filled 2020-02-15: qty 50

## 2020-02-15 MED ORDER — SODIUM CHLORIDE 0.9 % IV SOLN: INTRAVENOUS | Status: DC

## 2020-02-15 MED ORDER — GLYCOPYRROLATE 0.2 MG/ML IJ SOLN
INTRAMUSCULAR | Status: AC
  Filled 2020-02-15: qty 1

## 2020-02-15 MED ORDER — PROPOFOL 10 MG/ML IV BOLUS
INTRAVENOUS | Status: DC | PRN
  Administered 2020-02-15: 60 mg via INTRAVENOUS

## 2020-02-15 MED ORDER — PROPOFOL 500 MG/50ML IV EMUL
INTRAVENOUS | Status: DC | PRN
  Administered 2020-02-15: 150 ug/kg/min via INTRAVENOUS

## 2020-02-15 NOTE — Anesthesia Preprocedure Evaluation (Addendum)
Anesthesia Evaluation  Patient identified by MRN, date of birth, ID band Patient awake    Reviewed: Allergy & Precautions, H&P , NPO status , Patient's Chart, lab work & pertinent test results  History of Anesthesia Complications Negative for: history of anesthetic complications  Airway Mallampati: II  TM Distance: >3 FB     Dental  (+) Chipped   Pulmonary shortness of breath, with exertion and Long-Term Oxygen Therapy, neg sleep apnea, COPD (2.5L PRN and QHS),  oxygen dependent, Current Smoker and Patient abstained from smoking.,    + rhonchi        Cardiovascular (-) angina(-) Past MI and (-) Cardiac Stents (-) dysrhythmias  Rhythm:regular Rate:Normal  Reports longstanding intermittent chest pains not associated with exertion, per patient cardiac workup has been WNL   Neuro/Psych  Headaches, PSYCHIATRIC DISORDERS Depression    GI/Hepatic Neg liver ROS, hiatal hernia, GERD  ,  Endo/Other  negative endocrine ROS  Renal/GU negative Renal ROS  negative genitourinary   Musculoskeletal   Abdominal   Peds  Hematology negative hematology ROS (+)   Anesthesia Other Findings Past Medical History: No date: Anemia No date: Arthritis No date: Bronchitis No date: COPD (chronic obstructive pulmonary disease) (HCC) No date: Depression No date: Dysrhythmia No date: GERD (gastroesophageal reflux disease) No date: Headache No date: Heart murmur No date: History of hiatal hernia No date: Pneumonia No date: Pneumothorax No date: Wears dentures     Comment:  full upper  Past Surgical History: No date: APPENDECTOMY 08/19/2019: CATARACT EXTRACTION W/PHACO; Right     Comment:  Procedure: CATARACT EXTRACTION PHACO AND INTRAOCULAR               LENS PLACEMENT (IOC) RIGHT;  Surgeon: Nevada Crane,              MD;  Location: [facility];  Service: Ophthalmology;                Laterality: Right;  [place] 00:14.5 CDE 0.90 Fluid Pack  lot               # [id number] 09/20/2019: CATARACT EXTRACTION W/PHACO; Left     Comment:  Procedure: CATARACT EXTRACTION PHACO AND INTRAOCULAR               LENS PLACEMENT (IOC) LEFT 1.27  00:13.7;  Surgeon: Nevada Crane, MD;  Location: [facility];                Service: Ophthalmology;  Laterality: Left; No date: COLONOSCOPY No date: LUNG LOBECTOMY; Left No date: OOPHORECTOMY No date: ROTATOR CUFF REPAIR No date: SHOULDER ARTHROSCOPY; Right     Reproductive/Obstetrics negative OB ROS                            Anesthesia Physical Anesthesia Plan  ASA: II  Anesthesia Plan: General   Post-op Pain Management:    Induction:   PONV Risk Score and Plan: Propofol infusion and TIVA  Airway Management Planned: Nasal Cannula  Additional Equipment:   Intra-op Plan:   Post-operative Plan:   Informed Consent: I have reviewed the patients History and Physical, chart, labs and discussed the procedure including the risks, benefits and alternatives for the proposed anesthesia with the patient or authorized representative who has indicated his/her understanding and acceptance.     Dental Advisory Given  Plan Discussed  with: Anesthesiologist, CRNA and Surgeon  Anesthesia Plan Comments:         Anesthesia Quick Evaluation

## 2020-02-15 NOTE — Op Note (Signed)
[facility] Patient Name: Tammy Maxwell Procedure Date: 02/15/2020 9:01 AM MRN: [id number] Account #: [id number] Date of Birth: 10/15/1960 Admit Type: Outpatient Age: 60 Room: [facility] ENDO ROOM 2 Gender: Female Note Status: Finalized Procedure:             Colonoscopy Indications:           Slow transit constipation Providers:             Wyline Mood MD, MD Referring MD:          Sterling Big MD Medicines:             Monitored Anesthesia Care Complications:         No immediate complications. Procedure:             Pre-Anesthesia Assessment:                        - Prior to the procedure, a History and Physical was                         performed, and patient medications, allergies and                         sensitivities were reviewed. The patient's tolerance                         of previous anesthesia was reviewed.                        - The risks and benefits of the procedure and the                         sedation options and risks were discussed with the                         patient. All questions were answered and informed                         consent was obtained.                        - ASA Grade Assessment: II - A patient with mild                         systemic disease.                        After obtaining informed consent, the colonoscope was                         passed under direct vision. Throughout the procedure,                         the patient's blood pressure, pulse, and oxygen                         saturations were monitored continuously. The                         Colonoscope was introduced through the anus and  advanced to the the cecum, identified by the                         appendiceal orifice. The colonoscopy was performed                         with ease. The patient tolerated the procedure well.                         The quality of the bowel preparation was  poor. Findings:      The perianal and digital rectal examinations were normal.      A large amount of semi-liquid stool was found at the ileocecal valve and       in the entire colon, interfering with visualization. Impression:            - Preparation of the colon was poor.                        - Stool at the ileocecal valve and in the entire                         examined colon.                        - No specimens collected. Recommendation:        - Discharge patient to home (with escort).                        - Resume previous diet.                        - Continue present medications.                        - Repeat colonoscopy in 4 months because the bowel                         preparation was poor. Procedure Code(s):     --- Professional ---                        [phone], Colonoscopy, flexible; diagnostic, including                         collection of specimen(s) by brushing or washing, when                         performed (separate procedure) Diagnosis Code(s):     --- Professional ---                        K59.01, Slow transit constipation CPT copyright 2019 American Medical Association. All rights reserved. The codes documented in this report are preliminary and upon coder review may  be revised to meet current compliance requirements. Wyline Mood, MD Wyline Mood MD, MD 02/15/2020 9:36:12 AM This report has been signed electronically. Number of Addenda: 0 Note Initiated On: 02/15/2020 9:01 AM Scope Withdrawal Time: 0 hours 2 minutes 39 seconds  Total Procedure Duration: 0 hours 8 minutes 57 seconds  Estimated Blood Loss:  Estimated blood loss: none.  [facility]

## 2020-02-15 NOTE — H&P (Signed)
Tammy Mood, MD [address], [place], [place], [address] [address], [address], [place], [place], [address] Phone: [phone]  Fax: [phone]  Primary Care Physician:  Marliss Coots, MD   Pre-Procedure History & Physical: HPI:  Tammy Maxwell is a 60 y.o. female is here for an endoscopy and colonoscopy    Past Medical History:  Diagnosis Date  . Anemia   . Arthritis   . Bronchitis   . COPD (chronic obstructive pulmonary disease) (HCC)   . Depression   . Dysrhythmia   . GERD (gastroesophageal reflux disease)   . Headache   . Heart murmur   . History of hiatal hernia   . Pneumonia   . Pneumothorax   . Wears dentures    full upper    Past Surgical History:  Procedure Laterality Date  . APPENDECTOMY    . CATARACT EXTRACTION W/PHACO Right 08/19/2019   Procedure: CATARACT EXTRACTION PHACO AND INTRAOCULAR LENS PLACEMENT (IOC) RIGHT;  Surgeon: Nevada Crane, MD;  Location: [facility];  Service: Ophthalmology;  Laterality: Right;  [place] 00:14.5 CDE 0.90 Fluid Pack lot # [id number]  . CATARACT EXTRACTION W/PHACO Left 09/20/2019   Procedure: CATARACT EXTRACTION PHACO AND INTRAOCULAR LENS PLACEMENT (IOC) LEFT 1.27  00:13.7;  Surgeon: Nevada Crane, MD;  Location: [facility];  Service: Ophthalmology;  Laterality: Left;  . COLONOSCOPY    . LUNG LOBECTOMY Left   . OOPHORECTOMY    . ROTATOR CUFF REPAIR    . SHOULDER ARTHROSCOPY Right     Prior to Admission medications   Medication Sig Start Date End Date Taking? Authorizing Provider  aspirin 81 MG chewable tablet Chew 81 mg by mouth daily.   Yes [provider]  budesonide-formoterol (SYMBICORT) 160-4.5 MCG/ACT inhaler Inhale 2 puffs into the lungs 2 (two) times daily.   Yes [provider]  buPROPion (WELLBUTRIN SR) 150 MG 12 hr tablet Take 150 mg by mouth 2 (two) times daily.   Yes [provider]  clonazePAM (KLONOPIN) 1 MG tablet Take 1 mg by mouth at bedtime.   Yes  [provider]  meloxicam (MOBIC) 15 MG tablet Take 15 mg by mouth at bedtime.   Yes [provider]  omeprazole (PRILOSEC) 20 MG capsule Take 20 mg by mouth 2 (two) times daily before a meal.   Yes [provider]  PARoxetine (PAXIL) 20 MG tablet Take 20 mg by mouth at bedtime.   Yes [provider]  QUEtiapine (SEROQUEL) 400 MG tablet Take 400 mg by mouth at bedtime.   Yes [provider]  IPRATROPIUM BROMIDE HFA IN Inhale 2 puffs into the lungs 4 (four) times daily.    [provider]  SUMAtriptan (IMITREX) 100 MG tablet Take 100 mg by mouth every 2 (two) hours as needed for migraine. May repeat in 2 hours if headache persists or recurs.    [provider]  traMADol (ULTRAM) 50 MG tablet Take 50 mg by mouth 4 (four) times daily.    [provider]    Allergies as of 01/27/2020 - Review Complete 01/27/2020  Allergen Reaction Noted  . Penicillins Anaphylaxis 10/06/2017    History reviewed. No pertinent family history.  Social History   Socioeconomic History  . Marital status: Single    Spouse name: Not on file  . Number of children: Not on file  . Years of education: Not on file  . Highest education level: Not on file  Occupational  History  . Not on file  Tobacco Use  . Smoking status: Current Every Day Smoker    Packs/day: 0.50    Types: Cigarettes  . Smokeless tobacco: Never Used  Vaping Use  . Vaping Use: Never used  Substance and Sexual Activity  . Alcohol use: Not Currently  . Drug use: Never  . Sexual activity: Not on file  Other Topics Concern  . Not on file  Social History Narrative  . Not on file   Social Determinants of Health   Financial Resource Strain: Not on file  Food Insecurity: Not on file  Transportation Needs: Not on file  Physical Activity: Not on file  Stress: Not on file  Social Connections: Not on file  Intimate Partner Violence: Not on file    Review of Systems: See  HPI, otherwise negative ROS  Physical Exam: BP 129/84   Pulse 66   Temp 97.7 F (36.5 C) (Temporal)   Resp 14   Ht 5' 8.5" (1.74 m)   Wt 51.3 kg   SpO2 100%   BMI 16.93 kg/m  General:   Alert,  pleasant and cooperative in NAD Head:  Normocephalic and atraumatic. Neck:  Supple; no masses or thyromegaly. Lungs:  Clear throughout to auscultation, normal respiratory effort.    Heart:  +S1, +S2, Regular rate and rhythm, No edema. Abdomen:  Soft, nontender and nondistended. Normal bowel sounds, without guarding, and without rebound.   Neurologic:  Alert and  oriented x4;  grossly normal neurologically.  Impression/Plan: Tammy Maxwell is here for an endoscopy and colonoscopy  to be performed for  evaluation of dysphagia and constipation    Risks, benefits, limitations, and alternatives regarding endoscopy have been reviewed with the patient.  Questions have been answered.  All parties agreeable.   Tammy Mood, MD  02/15/2020, 9:11 AM

## 2020-02-15 NOTE — Telephone Encounter (Signed)
-----   Message from Wyline Mood, MD sent at 02/15/2020  1:34 PM EST ----- Inform that she has a yeast infection of her esophagus.  This can cause her difficulty with swallowing.  Commence on Diflucan.  400 mg x 1 as first dose followed by 200 mg a day taken once daily for 13 days then stop.  Follow-up in my office after sh e has completed the Diflucan in 3 to 4 weeks

## 2020-02-15 NOTE — Transfer of Care (Signed)
Immediate Anesthesia Transfer of Care Note  Patient: Tammy Maxwell  Procedure(s) Performed: COLONOSCOPY WITH PROPOFOL (N/A ) ESOPHAGOGASTRODUODENOSCOPY (EGD) WITH PROPOFOL (N/A )  Patient Location: Endoscopy Unit  Anesthesia Type:General  Level of Consciousness: sedated  Airway & Oxygen Therapy: Patient Spontanous Breathing  Post-op Assessment: Post -op Vital signs reviewed and stable  Post vital signs: stable  Last Vitals:  Vitals Value Taken Time  BP 124/79 02/15/20 0941  Temp    Pulse 73 02/15/20 0941  Resp 14 02/15/20 0941  SpO2 100 % 02/15/20 0941    Last Pain:  Vitals:   02/15/20 0854  TempSrc: Temporal  PainSc: 0-No pain         Complications: No complications documented.

## 2020-02-15 NOTE — Op Note (Signed)
[facility]astroenter[facility] Patient Name: Tammy Maxwell Procedure Date: 02/15/2020 9:02 AM MRN: [id number] Account #: [id number] Date of Birth: September 07, 1960 Admit Type: Outpatient Age: 60 Room: [facility] ENDO ROOM 2 Gender: Female Note Status: Finalized Procedure:             Upper GI endoscopy Indications:           Dysphagia Providers:             Wyline Mood MD, MD Referring MD:          Sterling Big MD Medicines:             Monitored Anesthesia Care Complications:         No immediate complications. Procedure:             Pre-Anesthesia Assessment:                        - Prior to the procedure, a History and Physical was                         performed, and patient medications, allergies and                         sensitivities were reviewed. The patient's tolerance                         of previous anesthesia was reviewed.                        - The risks and benefits of the procedure and the                         sedation options and risks were discussed with the                         patient. All questions were answered and informed                         consent was obtained.                        - ASA Grade Assessment: II - A patient with mild                         systemic disease.                        After obtaining informed consent, the endoscope was                         passed under direct vision. Throughout the procedure,                         the patient's blood pressure, pulse, and oxygen                         saturations were monitored continuously. The Endoscope                         was introduced through the mouth, and advanced to the  third part of duodenum. The upper GI endoscopy was                         accomplished with ease. The patient tolerated the                         procedure well. Findings:      Diffuse, white plaques were found in the upper third of the esophagus.        Brushings for KOH prep were obtained in the upper third of the esophagus.      Normal mucosa was found in the middle third of the esophagus and in the       lower third of the esophagus. Biopsies were taken with a cold forceps       for histology.      The stomach was normal.      The examined duodenum was normal. Impression:            - Esophageal plaques were found, suspicious for                         candidiasis. Brushings performed.                        - Normal mucosa was found in the middle third of the                         esophagus and in the lower third of the esophagus.                         Biopsied.                        - Normal stomach.                        - Normal examined duodenum. Recommendation:        - Await pathology results.                        - Perform a colonoscopy today. Procedure Code(s):     --- Professional ---                        [phone], Esophagogastroduodenoscopy, flexible,                         transoral; with biopsy, single or multiple Diagnosis Code(s):     --- Professional ---                        K22.9, Disease of esophagus, unspecified                        R13.10, Dysphagia, unspecified CPT copyright 2019 American Medical Association. All rights reserved. The codes documented in this report are preliminary and upon coder review may  be revised to meet current compliance requirements. Wyline Mood, MD Wyline Mood MD, MD 02/15/2020 9:21:55 AM This report has been signed electronically. Number of Addenda: 0 Note Initiated On: 02/15/2020 9:02 AM Estimated Blood Loss:  Estimated blood loss: none.      [facility]

## 2020-02-15 NOTE — Progress Notes (Signed)
Inform that she has a yeast infection of her esophagus.  This can cause her difficulty with swallowing.  Commence on Diflucan.  400 mg x 1 as first dose followed by 200 mg a day taken once daily for 13 days then stop.  Follow-up in my office after she has completed the Diflucan in 3 to 4 weeks

## 2020-02-16 ENCOUNTER — Encounter: Payer: Self-pay | Admitting: Gastroenterology

## 2020-02-16 ENCOUNTER — Other Ambulatory Visit: Payer: Self-pay

## 2020-02-16 MED ORDER — FLUCONAZOLE 200 MG PO TABS: 200.0000 mg | ORAL_TABLET | Freq: Every day | ORAL | 0 refills | Status: AC

## 2020-02-16 NOTE — Anesthesia Postprocedure Evaluation (Signed)
Anesthesia Post Note  Patient: Tammy Maxwell  Procedure(s) Performed: COLONOSCOPY WITH PROPOFOL (N/A ) ESOPHAGOGASTRODUODENOSCOPY (EGD) WITH PROPOFOL (N/A )  Patient location during evaluation: PACU Anesthesia Type: General Level of consciousness: awake and alert Pain management: pain level controlled Vital Signs Assessment: post-procedure vital signs reviewed and stable Respiratory status: spontaneous breathing, nonlabored ventilation and respiratory function stable Cardiovascular status: blood pressure returned to baseline and stable Postop Assessment: no apparent nausea or vomiting Anesthetic complications: no   No complications documented.   Last Vitals:  Vitals:   02/15/20 1001 02/15/20 1011  BP: 136/86 (!) 141/88  Pulse: 73 66  Resp: 18 16  Temp:    SpO2: 99% 100%    Last Pain:  Vitals:   02/16/20 0738  TempSrc:   PainSc: 0-No pain                 Karleen Hampshire

## 2020-02-17 ENCOUNTER — Encounter: Payer: Self-pay | Admitting: Gastroenterology

## 2020-02-17 LAB — SURGICAL PATHOLOGY

## 2020-02-22 ENCOUNTER — Telehealth: Payer: Self-pay | Admitting: Gastroenterology

## 2020-02-22 NOTE — Telephone Encounter (Signed)
Patient needs to r/s her procedure due to not being fully cleaned out.  Still having difficulty swallowing and abd pain. Please call patient to r/s Colonoscopy and EGD.

## 2020-02-24 NOTE — Telephone Encounter (Signed)
Returned patients call. LVM to call office back. 

## 2020-03-14 ENCOUNTER — Telehealth: Payer: Self-pay

## 2020-03-14 NOTE — Telephone Encounter (Signed)
Patient states since she had a the procedure she has been having constipation. Patient is feeling bloated and she has only had 2 bowel movements since procedure and had to use a suppository.

## 2020-03-15 NOTE — Telephone Encounter (Signed)
Pt has been notified of Dr. Anna's recommendations and agrees to try. 

## 2020-03-15 NOTE — Telephone Encounter (Signed)
Suggest one cap miralax daily and cal [place] if no response in 3-5 days

## 2021-02-22 ENCOUNTER — Encounter: Payer: Self-pay | Admitting: Emergency Medicine

## 2021-02-22 ENCOUNTER — Emergency Department: Payer: 59

## 2021-02-22 ENCOUNTER — Other Ambulatory Visit: Payer: Self-pay

## 2021-02-22 ENCOUNTER — Emergency Department
Admission: EM | Admit: 2021-02-22 | Discharge: 2021-02-22 | Disposition: A | Discharge status: Home / Self Care | Payer: 59 | Attending: Emergency Medicine | Admitting: Emergency Medicine

## 2021-02-22 DIAGNOSIS — R0789 Other chest pain: Secondary | ICD-10-CM | POA: Insufficient documentation

## 2021-02-22 DIAGNOSIS — W08XXXA Fall from other furniture, initial encounter: Secondary | ICD-10-CM | POA: Diagnosis not present

## 2021-02-22 DIAGNOSIS — J449 Chronic obstructive pulmonary disease, unspecified: Secondary | ICD-10-CM | POA: Diagnosis not present

## 2021-02-22 DIAGNOSIS — Z720 Tobacco use: Secondary | ICD-10-CM

## 2021-02-22 DIAGNOSIS — R079 Chest pain, unspecified: Secondary | ICD-10-CM

## 2021-02-22 DIAGNOSIS — R0602 Shortness of breath: Secondary | ICD-10-CM | POA: Insufficient documentation

## 2021-02-22 DIAGNOSIS — W19XXXA Unspecified fall, initial encounter: Secondary | ICD-10-CM

## 2021-02-22 MED ORDER — MELOXICAM 7.5 MG PO TABS
15.0000 mg | ORAL_TABLET | ORAL | Status: AC
  Administered 2021-02-22: 15 mg via ORAL
  Filled 2021-02-22: qty 2

## 2021-02-22 MED ORDER — OXYCODONE-ACETAMINOPHEN 5-325 MG PO TABS
1.0000 | ORAL_TABLET | Freq: Once | ORAL | Status: AC
  Administered 2021-02-22: 1 via ORAL
  Filled 2021-02-22: qty 1

## 2021-02-22 MED ORDER — ACETAMINOPHEN 325 MG PO TABS
650.0000 mg | ORAL_TABLET | Freq: Once | ORAL | Status: AC
  Administered 2021-02-22: 650 mg via ORAL
  Filled 2021-02-22: qty 2

## 2021-02-22 MED ORDER — OXYCODONE-ACETAMINOPHEN 5-325 MG PO TABS: 1.0000 | ORAL_TABLET | Freq: Three times a day (TID) | ORAL | 0 refills | Status: AC | PRN

## 2021-02-22 NOTE — ED Triage Notes (Signed)
Pt comes into the ED via POV c/o fall on Tuesday off a step stool and she landed on her left shoulder and back.  Pt states she is now having some SHOB since the fall.  Pt has even and unlabored respirations at this time.  Pt denies any blood thinners or any LOC with the fall.  PT able to ambulate well with no distress noted.  Pt does have h/o COPD.

## 2021-02-22 NOTE — ED Provider Notes (Signed)
[facility] Provider Note    Event Date/Time   First MD Initiated Contact with Patient 02/22/21 1451     (approximate)   History   Shortness of Breath and Fall   HPI  Tammy Maxwell is a 61 y.o. female with a past medical history of COPD and ongoing tobacco abuse with as needed oxygen at home, chronic back pain arthritis on chronic tramadol, anxiety on as needed Ativan, anemia, depression, GERD, history of left-sided pneumothorax who presents for evaluation of some persistent left-sided chest pain states is worse when she takes a deep breath started after she fell onto her left side of her chest 2 days ago.  Patient states he was cleaning some gutters and fell off a stepladder on the left chest.  She was not on the roof and did not hit her head or have any LOC.  She denies any midline neck or back pain, right-sided chest pain, headache, earache, sore throat, fevers, cough, abdominal pain, vomiting, diarrhea, urinary symptoms or rash or extremity pain although she feels she has been "twitchy in the left arm since this happened.  No other acute concerns at this time.   Past Medical History:  Diagnosis Date   Anemia    Arthritis    Bronchitis    COPD (chronic obstructive pulmonary disease) (HCC)    Depression    Dysrhythmia    GERD (gastroesophageal reflux disease)    Headache    Heart murmur    History of hiatal hernia    Pneumonia    Pneumothorax    Wears dentures    full upper     Physical Exam  Triage Vital Signs: ED Triage Vitals [02/22/21 1343]  Enc Vitals Group     BP (!) 127/95     Pulse Rate 86     Resp 18     Temp 98.6 F (37 C)     Temp Source Oral     SpO2 98 %     Weight 113 lb 1.5 oz (51.3 kg)     Height 5\' 8"  (1.727 m)     Head Circumference      Peak Flow      Pain Score 8     Pain Loc      Pain Edu?      Excl. in GC?     Most recent vital signs: Vitals:   02/22/21 1343  BP: (!) 127/95  Pulse: 86  Resp: 18  Temp:  98.6 F (37 C)  SpO2: 98%    General: Awake, no distress.  CV:  Good peripheral perfusion.  2+ radial pulses.  No significant murmurs. Resp:  Normal effort.  Clear bilaterally without any increased effort or distress. Abd:  No distention.  Soft. Other:  Sensation is intact in the distribution of the radial ulnar and median nerves in the left upper extremity.  She has full strength and range of motion to the left upper extremity.  Some mild tenderness over the left lateral and anterior chest.  No overlying skin changes.  No tenderness over the C/T/L-spine.   ED Results / Procedures / Treatments  Labs (all labs ordered are listed, but only abnormal results are displayed) Labs Reviewed - No data to display   EKG  ECG is remarkable for a ventricular rate of 77 with sinus rhythm, normal axis, unremarkable intervals without evidence of acute ischemia or significant arrhythmia.   RADIOLOGY Plain film of the left shoulder interpreted by myself  shows no acute fracture dislocation.  I also reviewed radiologist interpretation and agree with their findings of no acute process.  Chest reviewed by myself shows no focal consoidation, effusion, edema, pneumothorax or other clear acute thoracic process. I also reviewed radiology interpretation and agree with findings described.     PROCEDURES:  Critical Care performed: No  Procedures   MEDICATIONS ORDERED IN ED: Medications  oxyCODONE-acetaminophen (PERCOCET/ROXICET) 5-325 MG per tablet 1 tablet (has no administration in time range)  acetaminophen (TYLENOL) tablet 650 mg (has no administration in time range)  meloxicam (MOBIC) tablet 15 mg (has no administration in time range)     IMPRESSION / MDM / ASSESSMENT AND PLAN / ED COURSE  I reviewed the triage vital signs and the nursing notes.                              Differential diagnosis includes, but is not limited to pneumothorax, rib fracture and chest wall contusion with lower  suspicion at this time for dissection, PE or acute infectious process.  ECG is remarkable for a ventricular rate of 77 with sinus rhythm, normal axis, unremarkable intervals without evidence of acute ischemia or significant arrhythmia.  Plain film of the left shoulder interpreted by myself shows no acute fracture dislocation.  I also reviewed radiologist interpretation and agree with their findings of no acute process.  Chest reviewed by myself shows no focal consoidation, effusion, edema, pneumothorax or other clear acute thoracic process. I also reviewed radiology interpretation and agree with findings described.  Low suspicion for ACS at this time.  Certainly possible patient has a nondisplaced rib fracture on the left.  She does seem to be in a fair amount of pain.  Will provide incentive spirometer and a short course of Percocet.  Discussed that she cannot take her usual tramadol while taking the Percocet and that if she does take a Percocet for as needed breakthrough pain she cannot take her evening clonazepam at this puts her at high risk for respiratory depression dizziness and falls.  She states she understands this.  I have a low suspicion for other immediate life-threatening process and she has no evidence of respiratory failure distress and I do not believe she requires admission or any further diagnostic studies at this time.  Discharged in stable condition.  Strict return precautions advised and discussed.  Counseled on tobacco cessation.      FINAL CLINICAL IMPRESSION(S) / ED DIAGNOSES   Final diagnoses:  Fall, initial encounter  Left-sided chest pain  Tobacco abuse     Rx / DC Orders   ED Discharge Orders          Ordered    oxyCODONE-acetaminophen (PERCOCET) 5-325 MG tablet  Every 8 hours PRN        02/22/21 1509             Note:  This document was prepared using Dragon voice recognition software and may include unintentional dictation errors.   Gilles Chiquito, MD 02/22/21 1550

## 2021-02-22 NOTE — ED Notes (Signed)
See triage note  presents s/p fall  states she fell from step stool  having pain to left shoulder and chest area  pain increases with inspiration

## 2021-04-12 ENCOUNTER — Other Ambulatory Visit
Admission: RE | Admit: 2021-04-12 | Discharge: 2021-04-12 | Disposition: A | Discharge status: Home / Self Care | Payer: 59 | Attending: Urology | Admitting: Urology

## 2021-04-12 DIAGNOSIS — D649 Anemia, unspecified: Secondary | ICD-10-CM | POA: Diagnosis not present

## 2021-04-12 DIAGNOSIS — L659 Nonscarring hair loss, unspecified: Secondary | ICD-10-CM | POA: Diagnosis present

## 2021-04-12 DIAGNOSIS — R5383 Other fatigue: Secondary | ICD-10-CM | POA: Diagnosis not present

## 2021-04-12 LAB — CBC WITH DIFFERENTIAL/PLATELET
Abs Immature Granulocytes: 0.02 10*3/uL (ref 0.00–0.07)
Basophils Absolute: 0.1 10*3/uL (ref 0.0–0.1)
Basophils Relative: 1 %
Eosinophils Absolute: 0.1 10*3/uL (ref 0.0–0.5)
Eosinophils Relative: 1 %
HCT: 30 % — ABNORMAL LOW (ref 36.0–46.0)
Hemoglobin: 9 g/dL — ABNORMAL LOW (ref 12.0–15.0)
Immature Granulocytes: 0 %
Lymphocytes Relative: 33 %
Lymphs Abs: 2.1 10*3/uL (ref 0.7–4.0)
MCH: 22 pg — ABNORMAL LOW (ref 26.0–34.0)
MCHC: 30 g/dL (ref 30.0–36.0)
MCV: 73.3 fL — ABNORMAL LOW (ref 80.0–100.0)
Monocytes Absolute: 0.5 10*3/uL (ref 0.1–1.0)
Monocytes Relative: 9 %
Neutro Abs: 3.6 10*3/uL (ref 1.7–7.7)
Neutrophils Relative %: 56 %
Platelets: 257 10*3/uL (ref 150–400)
RBC: 4.09 MIL/uL (ref 3.87–5.11)
RDW: 17.2 % — ABNORMAL HIGH (ref 11.5–15.5)
WBC: 6.3 10*3/uL (ref 4.0–10.5)
nRBC: 0 % (ref 0.0–0.2)

## 2021-04-12 LAB — COMPREHENSIVE METABOLIC PANEL
ALT: 9 U/L (ref 0–44)
AST: 20 U/L (ref 15–41)
Albumin: 3.6 g/dL (ref 3.5–5.0)
Alkaline Phosphatase: 120 U/L (ref 38–126)
Anion gap: 8 (ref 5–15)
BUN: 16 mg/dL (ref 6–20)
CO2: 26 mmol/L (ref 22–32)
Calcium: 8.8 mg/dL — ABNORMAL LOW (ref 8.9–10.3)
Chloride: 104 mmol/L (ref 98–111)
Creatinine, Ser: 1.31 mg/dL — ABNORMAL HIGH (ref 0.44–1.00)
GFR, Estimated: 47 mL/min — ABNORMAL LOW (ref >60)
Glucose, Bld: 102 mg/dL — ABNORMAL HIGH (ref 70–99)
Potassium: 3.7 mmol/L (ref 3.5–5.1)
Sodium: 138 mmol/L (ref 135–145)
Total Bilirubin: 0.6 mg/dL (ref 0.3–1.2)
Total Protein: 6.9 g/dL (ref 6.5–8.1)

## 2021-04-12 LAB — TSH: TSH: 6.167 u[IU]/mL — ABNORMAL HIGH (ref 0.350–4.500)

## 2021-04-12 LAB — FERRITIN: Ferritin: 3 ng/mL — ABNORMAL LOW (ref 11–307)

## 2021-06-07 ENCOUNTER — Other Ambulatory Visit: Payer: Self-pay | Admitting: *Deleted

## 2021-06-07 DIAGNOSIS — Z87891 Personal history of nicotine dependence: Secondary | ICD-10-CM

## 2021-06-07 DIAGNOSIS — F17210 Nicotine dependence, cigarettes, uncomplicated: Secondary | ICD-10-CM

## 2021-06-07 DIAGNOSIS — Z122 Encounter for screening for malignant neoplasm of respiratory organs: Secondary | ICD-10-CM

## 2021-06-08 ENCOUNTER — Emergency Department: Payer: 59

## 2021-06-08 ENCOUNTER — Emergency Department
Admission: EM | Admit: 2021-06-08 | Discharge: 2021-06-08 | Disposition: A | Discharge status: Home / Self Care | Payer: 59 | Attending: Emergency Medicine | Admitting: Emergency Medicine

## 2021-06-08 ENCOUNTER — Other Ambulatory Visit: Payer: Self-pay

## 2021-06-08 ENCOUNTER — Encounter: Payer: Self-pay | Admitting: Emergency Medicine

## 2021-06-08 DIAGNOSIS — Y93K1 Activity, walking an animal: Secondary | ICD-10-CM | POA: Insufficient documentation

## 2021-06-08 DIAGNOSIS — Y92009 Unspecified place in unspecified non-institutional (private) residence as the place of occurrence of the external cause: Secondary | ICD-10-CM | POA: Diagnosis not present

## 2021-06-08 DIAGNOSIS — W1830XA Fall on same level, unspecified, initial encounter: Secondary | ICD-10-CM | POA: Diagnosis not present

## 2021-06-08 DIAGNOSIS — S92152A Displaced avulsion fracture (chip fracture) of left talus, initial encounter for closed fracture: Secondary | ICD-10-CM | POA: Insufficient documentation

## 2021-06-08 DIAGNOSIS — S92355A Nondisplaced fracture of fifth metatarsal bone, left foot, initial encounter for closed fracture: Secondary | ICD-10-CM | POA: Diagnosis not present

## 2021-06-08 DIAGNOSIS — S99922A Unspecified injury of left foot, initial encounter: Secondary | ICD-10-CM | POA: Diagnosis present

## 2021-06-08 NOTE — ED Triage Notes (Signed)
Pt fell on Monday and hurt her left ankle. Pt states that the swelling had gotten a little better but she still has swelling and pain in her ankle. Pt is in NAD.

## 2021-06-08 NOTE — Discharge Instructions (Signed)
You have a fracture of the base of the fifth toe on the side of the foot.  You also have a small avulsion of the ankle.  The foot fracture will would likely heal without surgical intervention.  The avulsion of the ankle will feel like a bad sprain.  Wear the boot when out of bed and for all walking activities.  Rest with the foot elevated and apply ice to reduce swelling.  You should follow-up with podiatry for reevaluation in 2 weeks.

## 2021-06-08 NOTE — ED Provider Notes (Signed)
[facility] Emergency Department Provider Note     Event Date/Time   First MD Initiated Contact with Patient 06/08/21 1227     (approximate)   History   Fall and Ankle Pain   HPI  Tammy Maxwell is a 61 y.o. female presents to the ED for ongoing left foot and ankle pain after mechanical fall at home.  She twisted and fell walking through her house in the wee hours of the morning.  She presents today noting ongoing pain, swelling, and disability to the left foot and ankle.  She denies any other injury at this time.   Physical Exam   Triage Vital Signs: ED Triage Vitals  Enc Vitals Group     BP 06/08/21 1205 108/80     Pulse Rate 06/08/21 1200 87     Resp 06/08/21 1200 16     Temp 06/08/21 1200 98.4 F (36.9 C)     Temp Source 06/08/21 1200 Oral     SpO2 06/08/21 1200 98 %     Weight --      Height --      Head Circumference --      Peak Flow --      Pain Score 06/08/21 1202 8     Pain Loc --      Pain Edu? --      Excl. in GC? --     Most recent vital signs: Vitals:   06/08/21 1200 06/08/21 1205  BP:  108/80  Pulse: 87   Resp: 16   Temp: 98.4 F (36.9 C)   SpO2: 98%     General Awake, no distress.  CV:  Good peripheral perfusion.  RESP:  Normal effort.  ABD:  No distention.  MSK:  Left foot without obvious deformity but dorsolateral ecchymosis is noted.  Patient is also tender to palpation over the fifth metatarsal.  Some pain to the dorsal ankle is also appreciated.  No calf Oaklee tenderness is elicited.   ED Results / Procedures / Treatments   Labs (all labs ordered are listed, but only abnormal results are displayed) Labs Reviewed - No data to display   EKG    RADIOLOGY  I personally viewed and evaluated these images as part of my medical decision making, as well as reviewing the written report by the radiologist.  ED Provider Interpretation: 5th MT fracture + talar avulsion fx noted}  DG Ankle Complete  Left  Result Date: 06/08/2021 CLINICAL DATA:  Pt fell on Monday and hurt her left ankle. Pt states that the swelling had gotten a little better but she still has swelling and pain in her ankle. Bruising noted to dorsum of 4th and 5th metatarsal. EXAM: LEFT ANKLE COMPLETE - 3+ VIEW COMPARISON:  None Available. FINDINGS: Small avulsion fracture from the dorsal anterior aspect of the talus with mild associated soft tissue swelling. No other fractures. Ankle joint is normally spaced and aligned. No arthropathic changes. IMPRESSION: 1. Small avulsion fracture from the dorsal anterior aspect of the talus. No other fractures. Normal alignment of the ankle joint. Electronically Signed   By: Amie Portland M.D.   On: 06/08/2021 12:33   DG Foot Complete Left  Result Date: 06/08/2021 CLINICAL DATA:  Larey Seat on Monday injuring LEFT ankle, persistent swelling and pain, bruising dorsal to the fourth and fifth metatarsals EXAM: LEFT FOOT - COMPLETE 3+ VIEW COMPARISON:  None Available. FINDINGS: Osseous demineralization. Joint spaces preserved. Oblique essentially nondisplaced fracture involving fifth metatarsal  diaphysis. Additional os effect density at the dorsal margin of the distal talus consistent with age-indeterminate avulsion fracture, no definite donor site seen. No additional fracture, dislocation, or bone destruction. IMPRESSION: Essentially nondisplaced oblique fracture of fifth metatarsal diaphysis. Age-indeterminate avulsion fracture at dorsal margin of distal talus; recommend correlation for pain/tenderness at this site. Electronically Signed   By: Ulyses Southward M.D.   On: 06/08/2021 12:35     PROCEDURES:  Critical Care performed: No  Procedures   MEDICATIONS ORDERED IN ED: Medications - No data to display   IMPRESSION / MDM / ASSESSMENT AND PLAN / ED COURSE  I reviewed the triage vital signs and the nursing notes.                              Differential diagnosis includes, but is not limited to,  ankle sprain, foot sprain, foot fracture, ankle fracture, ankle dislocation  Patient's presentation is most consistent with acute complicated illness / injury requiring diagnostic workup.  Patient to the ED with ongoing left foot ankle pain following mechanical injury.  Patient does have a focal complaints, found to have evidence of a metatarsal shaft fracture on the left foot as well as an avulsion fracture to the talar dome.  Patient is placed in a cam boot for protection and support.  Patient will be discharged home with instructions to take OTC Tylenol, Motrin, and tramadol as previously prescribed.. Patient is to follow up with podiatry as needed or otherwise directed. Patient is given ED precautions to return to the ED for any worsening or new symptoms.  FINAL CLINICAL IMPRESSION(S) / ED DIAGNOSES   Final diagnoses:  Closed nondisplaced fracture of fifth metatarsal bone of left foot, initial encounter  Closed displaced avulsion fracture of left talus, initial encounter     Rx / DC Orders   ED Discharge Orders     None        Note:  This document was prepared using Dragon voice recognition software and may include unintentional dictation errors.    Lissa Hoard, PA-C 06/08/21 1949    Delton Prairie, MD 06/17/21 [phone]

## 2021-07-02 ENCOUNTER — Encounter: Payer: 59 | Admitting: Acute Care

## 2021-07-02 ENCOUNTER — Ambulatory Visit: Admission: RE | Admit: 2021-07-02 | Payer: 59 | Source: Ambulatory Visit

## 2021-07-31 ENCOUNTER — Encounter: Payer: Self-pay | Admitting: Acute Care

## 2021-07-31 ENCOUNTER — Ambulatory Visit (INDEPENDENT_AMBULATORY_CARE_PROVIDER_SITE_OTHER): Payer: 59 | Admitting: Acute Care

## 2021-07-31 DIAGNOSIS — F17210 Nicotine dependence, cigarettes, uncomplicated: Secondary | ICD-10-CM | POA: Diagnosis not present

## 2021-07-31 NOTE — Patient Instructions (Signed)
Thank you for participating in the [facility]. It was our pleasure to meet you today. We will call you with the results of your scan within the next few days. Your scan will be assigned a Lung RADS category score by the physicians reading the scans.  This Lung RADS score determines follow up scanning.  See below for description of categories, and follow up screening recommendations. We will be in touch to schedule your follow up screening annually or based on recommendations of our providers. We will fax a copy of your scan results to your Primary Care Physician, or the physician who referred you to the program, to ensure they have the results. Please call the office if you have any questions or concerns regarding your scanning experience or results.  Our office number is [phone]. Please speak with Denise Phelps, RN. , or  Denise Buckner RN, They are  our Lung Cancer Screening RN.'s If They are unavailable when you call, Please leave a message on the voice mail. We will return your call at our earliest convenience.This voice mail is monitored several times a day.  Remember, if your scan is normal, we will scan you annually as long as you continue to meet the criteria for the program. (Age 55-77, Current smoker or smoker who has quit within the last 15 years). If you are a smoker, remember, quitting is the single most powerful action that you can take to decrease your risk of lung cancer and other pulmonary, breathing related problems. We know quitting is hard, and we are here to help.  Please let us know if there is anything we can do to help you meet your goal of quitting. If you are a former smoker, congratulations. We are proud of you! Remain smoke free! Remember you can refer friends or family members through the number above.  We will screen them to make sure they meet criteria for the program. Thank you for helping us take better care of you by  participating in Lung Screening.  You can receive free nicotine replacement therapy ( patches, gum or mints) by calling 1-800-QUIT NOW. Please call so we can get you on the path to becoming  a non-smoker. I know it is hard, but you can do this!  Lung RADS Categories:  Lung RADS 1: no nodules or definitely non-concerning nodules.  Recommendation is for a repeat annual scan in 12 months.  Lung RADS 2:  nodules that are non-concerning in appearance and behavior with a very low likelihood of becoming an active cancer. Recommendation is for a repeat annual scan in 12 months.  Lung RADS 3: nodules that are probably non-concerning , includes nodules with a low likelihood of becoming an active cancer.  Recommendation is for a 6-month repeat screening scan. Often noted after an upper respiratory illness. We will be in touch to make sure you have no questions, and to schedule your 6-month scan.  Lung RADS 4 A: nodules with concerning findings, recommendation is most often for a follow up scan in 3 months or additional testing based on our provider's assessment of the scan. We will be in touch to make sure you have no questions and to schedule the recommended 3 month follow up scan.  Lung RADS 4 B:  indicates findings that are concerning. We will be in touch with you to schedule additional diagnostic testing based on our provider's  assessment of the scan.  Other options for assistance in smoking cessation (   As covered by your insurance benefits)  Hypnosis for smoking cessation  [employer] [phone]  Acupuncture for smoking cessation  [facility] [phone]   

## 2021-07-31 NOTE — Progress Notes (Signed)
Virtual Visit via Telephone Note  I connected with Tammy Maxwell on 11/21/20 at  2:00 PM EST by telephone and verified that I am speaking with the correct person using two identifiers.  Location: Patient: Home Provider: Working from home   I discussed the limitations, risks, security and privacy concerns of performing an evaluation and management service by telephone and the availability of in person appointments. I also discussed with the patient that there may be a patient responsible charge related to this service. The patient expressed understanding and agreed to proceed.  Shared Decision Making Visit Lung Cancer Screening Program [phone])   Eligibility: Age 61 y.o. Pack Years Smoking History Calculation 100 (# packs/per year x # years smoked) Recent History of coughing up blood  no Unexplained weight loss? no ( >Than 15 pounds within the last 6 months ) Prior History Lung / other cancer no (Diagnosis within the last 5 years already requiring surveillance chest CT Scans). Smoking Status Current Smoker Former Smokers: Years since quit: NA  Quit Date: NA  Visit Components: Discussion included one or more decision making aids. yes Discussion included risk/benefits of screening. yes Discussion included potential follow up diagnostic testing for abnormal scans. yes Discussion included meaning and risk of over diagnosis. yes Discussion included meaning and risk of False Positives. yes Discussion included meaning of total radiation exposure. yes  Counseling Included: Importance of adherence to annual lung cancer LDCT screening. yes Impact of comorbidities on ability to participate in the program. yes Ability and willingness to under diagnostic treatment. yes  Smoking Cessation Counseling: Current Smokers:  Discussed importance of smoking cessation. yes Information about tobacco cessation classes and interventions provided to patient. yes Patient provided with "ticket" for  LDCT Scan. yes Symptomatic Patient. yes  Counseling(Intermediate counseling: > three minutes) [address]406 Diagnosis Code: Tobacco Use Z72.0 Asymptomatic Patient no  Counseling NA Former Smokers:  Discussed the importance of maintaining cigarette abstinence. yes Diagnosis Code: Personal History of Nicotine Dependence. U04.540 Information about tobacco cessation classes and interventions provided to patient. Yes Patient provided with "ticket" for LDCT Scan. yes Written Order for Lung Cancer Screening with LDCT placed in Epic. Yes (CT Chest Lung Cancer Screening Low Dose W/O CM) [id number] Z12.2-Screening of respiratory organs Z87.891-Personal history of nicotine dependence   I spent 25 minutes of face to face time with her discussing the risks and benefits of lung cancer screening. We viewed a power point together that explained in detail the above noted topics. We took the time to pause the power point at intervals to allow for questions to be asked and answered to ensure understanding. We discussed that she had taken the single most powerful action possible to decrease her risk of developing lung cancer when she quit smoking. I counseled her to remain smoke free, and to contact me if she ever had the desire to smoke again so that I can provide resources and tools to help support the effort to remain smoke free. We discussed the time and location of the scan, and that either  Abigail Miyamoto RN or I will call with the results within  24-48 hours of receiving them. She has my card and contact information in the event she needs to speak with me, in addition to a copy of the power point we reviewed as a resource. She verbalized understanding of all of the above and had no further questions upon leaving the office.     I explained to the patient that there has been a high  incidence of coronary artery disease noted on these exams. I explained that this is a non-gated exam therefore degree or severity cannot be  determined. This patient is not on statin therapy. I have asked the patient to follow-up with their PCP regarding any incidental finding of coronary artery disease and management with diet or medication as they feel is clinically indicated. The patient verbalized understanding of the above and had no further questions.  I spent 3 minutes counseling on smoking cessation and the health risks of continued tobacco abuse    Kyzen Horn D. Tiburcio Pea, NP-C [facility] Personal contact information can be found on Amion  07/31/2021, 11:46 AM

## 2021-08-01 ENCOUNTER — Ambulatory Visit
Admission: RE | Admit: 2021-08-01 | Discharge: 2021-08-01 | Disposition: A | Discharge status: Home / Self Care | Payer: 59 | Source: Ambulatory Visit | Attending: Acute Care | Admitting: Acute Care

## 2021-08-01 DIAGNOSIS — Z122 Encounter for screening for malignant neoplasm of respiratory organs: Secondary | ICD-10-CM | POA: Insufficient documentation

## 2021-08-01 DIAGNOSIS — F17210 Nicotine dependence, cigarettes, uncomplicated: Secondary | ICD-10-CM | POA: Insufficient documentation

## 2021-08-01 DIAGNOSIS — Z87891 Personal history of nicotine dependence: Secondary | ICD-10-CM | POA: Insufficient documentation

## 2021-08-02 ENCOUNTER — Telehealth: Payer: Self-pay | Admitting: *Deleted

## 2021-08-02 ENCOUNTER — Other Ambulatory Visit: Payer: Self-pay | Admitting: *Deleted

## 2021-08-02 DIAGNOSIS — F17210 Nicotine dependence, cigarettes, uncomplicated: Secondary | ICD-10-CM

## 2021-08-02 DIAGNOSIS — Z87891 Personal history of nicotine dependence: Secondary | ICD-10-CM

## 2021-08-02 DIAGNOSIS — Z122 Encounter for screening for malignant neoplasm of respiratory organs: Secondary | ICD-10-CM

## 2021-08-02 NOTE — Telephone Encounter (Signed)
Spoke with pt and reviewed lung screening CT results. Pt advised that there was a small lung nodule noted that will be looked at again on her next scan in 12 months. Also advised of coronary calcifications seen . Pt is not on cholesterol med at this time. She is aware to f/u with Dr Vanessa Ralphs regarding this. Pt also advised that she is a small area of fluid around her heart. Pt does report having increased swelling in feel and legs recently. I have advised pt that she will need a follow up with her PCP to evaluate this further. Pt verbalized understanding. Copy of CT faxed to Dr Vanessa Ralphs. Pt is aware that we will contact her closer to 12 months to repeat her lung screening CT. CT order has been placed.

## 2021-08-20 ENCOUNTER — Ambulatory Visit: Payer: 59 | Admitting: Internal Medicine

## 2021-08-20 NOTE — Progress Notes (Deleted)
Cardiology Office Note:    Date:  08/20/2021   ID:  Tammy Maxwell, DOB June 20, 1960, MRN [id number]  PCP:  Marliss Coots, MD   [facility] Providers Cardiologist:  None { Click to update primary MD,subspecialty MD or APP then REFRESH:1}    Referring MD: Marliss Coots, MD   No chief complaint on file. ***  History of Present Illness:    Tammy Maxwell is a 61 y.o. female with a hx of ***  Past Medical History:  Diagnosis Date   Anemia    Arthritis    Bronchitis    Chest pain    COPD (chronic obstructive pulmonary disease) (HCC)    Depression    Dysrhythmia    GERD (gastroesophageal reflux disease)    Headache    Heart murmur    History of hiatal hernia    Pneumonia    Pneumothorax    Wears dentures    full upper    Past Surgical History:  Procedure Laterality Date   APPENDECTOMY     CATARACT EXTRACTION W/PHACO Right 08/19/2019   Procedure: CATARACT EXTRACTION PHACO AND INTRAOCULAR LENS PLACEMENT (IOC) RIGHT;  Surgeon: Nevada Crane, MD;  Location: [facility] ORS;  Service: Ophthalmology;  Laterality: Right;  [place] 00:14.5 CDE 0.90 Fluid Pack lot # [id number]   CATARACT EXTRACTION W/PHACO Left 09/20/2019   Procedure: CATARACT EXTRACTION PHACO AND INTRAOCULAR LENS PLACEMENT (IOC) LEFT 1.27  00:13.7;  Surgeon: Nevada Crane, MD;  Location: [facility];  Service: Ophthalmology;  Laterality: Left;   COLONOSCOPY     COLONOSCOPY WITH PROPOFOL N/A 02/15/2020   Procedure: COLONOSCOPY WITH PROPOFOL;  Surgeon: Wyline Mood, MD;  Location: [facility] ENDOSCOPY;  Service: Gastroenterology;  Laterality: N/A;   ESOPHAGOGASTRODUODENOSCOPY (EGD) WITH PROPOFOL N/A 02/15/2020   Procedure: ESOPHAGOGASTRODUODENOSCOPY (EGD) WITH PROPOFOL;  Surgeon: Wyline Mood, MD;  Location: [facility] END[facility];  Service: Gastroenterology;  Laterality: N/A;   LUNG LOBECTOMY Left    OOPHORECTOMY     ROTATOR CUFF REPAIR     SHOULDER ARTHROSCOPY Right     Current Medications: No outpatient  medications have been marked as taking for the 08/20/21 encounter (Appointment) with Christell Constant, MD.     Allergies:   Penicillins   Social History   Socioeconomic History   Marital status: Single    Spouse name: Not on file   Number of children: Not on file   Years of education: Not on file   Highest education level: Not on file  Occupational History   Not on file  Tobacco Use   Smoking status: Every Day    Packs/day: 2.00    Years: 47.00    Total pack years: 94.00    Types: Cigarettes   Smokeless tobacco: Never   Tobacco comments:    Recently cut back to 0.5 PPD  Vaping Use   Vaping Use: Never used  Substance and Sexual Activity   Alcohol use: Not Currently   Drug use: Never   Sexual activity: Not on file  Other Topics Concern   Not on file  Social History Narrative   Not on file   Social Determinants of Health   Financial Resource Strain: Not on file  Food Insecurity: Not on file  Transportation Needs: Not on file  Physical Activity: Not on file  Stress: Not on file  Social Connections: Not on file     Family History: The patient's ***family history is not on file.  ROS:   Please see the history of  present illness.    *** All other systems reviewed and are negative.  EKGs/Labs/Other Studies Reviewed:    The following studies were reviewed today: ***  EKG:  EKG is *** ordered today.  The ekg ordered today demonstrates ***  Recent Labs: 04/12/2021: ALT 9; BUN 16; Creatinine, Ser 1.31; Hemoglobin 9.0; Platelets 257; Potassium 3.7; Sodium 138; TSH 6.167  Recent Lipid Panel No results found for: "CHOL", "TRIG", "HDL", "CHOLHDL", "VLDL", "LDLCALC", "LDLDIRECT"   Risk Assessment/Calculations:   {Does this patient have ATRIAL FIBRILLATION?[phone]}       Physical Exam:    VS:  There were no vitals taken for this visit.    Wt Readings from Last 3 Encounters:  02/22/21 113 lb 1.5 oz (51.3 kg)  02/15/20 113 lb (51.3 kg)  01/27/20 113 lb  3.2 oz (51.3 kg)    GEN: *** Well nourished, well developed in no acute distress HEENT: Normal NECK: No JVD; No carotid bruits LYMPHATICS: No lymphadenopathy CARDIAC: ***RRR, no murmurs, rubs, gallops RESPIRATORY:  Clear to auscultation without rales, wheezing or rhonchi  ABDOMEN: Soft, non-tender, non-distended MUSCULOSKELETAL:  No edema; No deformity  SKIN: Warm and dry NEUROLOGIC:  Alert and oriented x 3 PSYCHIATRIC:  Normal affect   ASSESSMENT:    No diagnosis found. PLAN:    CAC Aortic atherosclerosis      {Are you ordering a CV Procedure (e.g. stress test, cath, DCCV, TEE, etc)?   Press F2        :[id number]}    Medication Adjustments/Labs and Tests Ordered: Current medicines are reviewed at length with the patient today.  Concerns regarding medicines are outlined above.  No orders of the defined types were placed in this encounter.  No orders of the defined types were placed in this encounter.   There are no Patient Instructions on file for this visit.   Signed, Christell Constant, MD  08/20/2021 8:12 AM    [facility]

## 2021-09-19 ENCOUNTER — Ambulatory Visit: Payer: 59 | Admitting: Internal Medicine

## 2021-11-08 ENCOUNTER — Encounter: Payer: Self-pay | Admitting: Cardiology

## 2021-11-08 ENCOUNTER — Ambulatory Visit: Payer: 59 | Attending: Internal Medicine | Admitting: Cardiology

## 2021-11-08 VITALS — BP 114/82 | HR 89 | Ht 68.5 in | Wt 113.4 lb

## 2021-11-08 DIAGNOSIS — I3139 Other pericardial effusion (noninflammatory): Secondary | ICD-10-CM

## 2021-11-08 DIAGNOSIS — R072 Precordial pain: Secondary | ICD-10-CM

## 2021-11-08 DIAGNOSIS — I2510 Atherosclerotic heart disease of native coronary artery without angina pectoris: Secondary | ICD-10-CM | POA: Diagnosis not present

## 2021-11-08 DIAGNOSIS — F17200 Nicotine dependence, unspecified, uncomplicated: Secondary | ICD-10-CM

## 2021-11-08 DIAGNOSIS — Z13220 Encounter for screening for lipoid disorders: Secondary | ICD-10-CM

## 2021-11-08 MED ORDER — ISOSORBIDE MONONITRATE ER 30 MG PO TB24: 15.0000 mg | ORAL_TABLET | Freq: Every day | ORAL | 3 refills | Status: DC

## 2021-11-08 MED ORDER — METOPROLOL TARTRATE 100 MG PO TABS: 100.0000 mg | ORAL_TABLET | Freq: Once | ORAL | 0 refills | Status: DC

## 2021-11-08 MED ORDER — IVABRADINE HCL 5 MG PO TABS: 15.0000 mg | ORAL_TABLET | Freq: Once | ORAL | 0 refills | Status: AC

## 2021-11-08 MED ORDER — IVABRADINE HCL 5 MG PO TABS: 15.0000 mg | ORAL_TABLET | Freq: Once | ORAL | 0 refills | Status: DC

## 2021-11-08 NOTE — Progress Notes (Signed)
Cardiology Office Note:    Date:  11/08/2021   ID:  Tammy Maxwell, DOB [birth year], MRN [id number]  PCP:  Sharol Roussel, MD   [facility] Providers Cardiologist:  Kate Sable, MD     Referring MD: Sharol Roussel, MD   Chief Complaint  Patient presents with   New Patient (Initial Visit)    Pericardial Effusion, Chest pain, arm pain, leg pain, sob    History of Present Illness:    Tammy Maxwell is a 61 y.o. female with a hx of CAD (3V coronary calcification), GERD, current smoker x40+ years, COPD who presents due to chest pain and pericardial effusion.  She had a CT chest lung cancer screening on 08/01/2021 which showed a small pericardial effusion.  Coronary calcifications and emphysema also noted.  Over the past year she is experienced chest pain/tightness sometimes with exertion.  Also gets out of breath.  Father heart CAD in his 6s.  She is not sure if cardiac surgery or stenting was performed.  Currently smokes, is working on quitting.  Past Medical History:  Diagnosis Date   Anemia    Arthritis    Bronchitis    Chest pain    COPD (chronic obstructive pulmonary disease) (HCC)    Depression    Dysrhythmia    GERD (gastroesophageal reflux disease)    Headache    Heart murmur    History of hiatal hernia    Pneumonia    Pneumothorax    Wears dentures    full upper    Past Surgical History:  Procedure Laterality Date   APPENDECTOMY     CATARACT EXTRACTION W/PHACO Right 08/19/2019   Procedure: CATARACT EXTRACTION PHACO AND INTRAOCULAR LENS PLACEMENT ([place]) RIGHT;  Surgeon: Eulogio Bear, MD;  Location: [facility] ORS;  Service: Ophthalmology;  Laterality: Right;  [place] 00:14.5 CDE 0.90 Fluid Pack lot # [id number]   CATARACT EXTRACTION W/PHACO Left 09/20/2019   Procedure: CATARACT EXTRACTION PHACO AND INTRAOCULAR LENS PLACEMENT (IOC) LEFT 1.27  00:13.7;  Surgeon: Eulogio Bear, MD;  Location: [facility];  Service: Ophthalmology;  Laterality:  Left;   COLONOSCOPY     COLONOSCOPY WITH PROPOFOL N/A 02/15/2020   Procedure: COLONOSCOPY WITH PROPOFOL;  Surgeon: Jonathon Bellows, MD;  Location: [facility] ENDOSCOPY;  Service: Gastroenterology;  Laterality: N/A;   ESOPHAGOGASTRODUODENOSCOPY (EGD) WITH PROPOFOL N/A 02/15/2020   Procedure: ESOPHAGOGASTRODUODENOSCOPY (EGD) WITH PROPOFOL;  Surgeon: Jonathon Bellows, MD;  Location: [facility] ENDOSCOPY;  Service: Gastroenterology;  Laterality: N/A;   LUNG LOBECTOMY Left    OOPHORECTOMY     ROTATOR CUFF REPAIR     SHOULDER ARTHROSCOPY Right     Current Medications: Current Meds  Medication Sig   albuterol (PROVENTIL) (2.5 MG/3ML) 0.083% nebulizer solution Take 2.5 mg by nebulization 4 (four) times daily.   aspirin 81 MG chewable tablet Chew 81 mg by mouth daily.   budesonide-formoterol (SYMBICORT) 160-4.5 MCG/ACT inhaler Inhale 2 puffs into the lungs 2 (two) times daily.   buPROPion (WELLBUTRIN SR) 150 MG 12 hr tablet Take 150 mg by mouth 2 (two) times daily.   carisoprodol (SOMA) 350 MG tablet Take 350 mg by mouth 3 (three) times daily as needed for muscle spasms.   clonazePAM (KLONOPIN) 1 MG tablet Take 1 mg by mouth at bedtime.   IPRATROPIUM BROMIDE HFA IN Inhale 2 puffs into the lungs 4 (four) times daily.   meloxicam (MOBIC) 15 MG tablet Take 15 mg by mouth at bedtime.   nicotine (NICODERM CQ - DOSED IN  MG/24 HOURS) 14 mg/24hr patch Place 14 mg onto the skin daily.   nitroGLYCERIN (NITROSTAT) 0.3 MG SL tablet Place 0.3 mg under the tongue every 5 (five) minutes as needed for chest pain.   omeprazole (PRILOSEC) 20 MG capsule Take 20 mg by mouth 2 (two) times daily before a meal.   PARoxetine (PAXIL) 20 MG tablet Take 20 mg by mouth at bedtime.   QUEtiapine (SEROQUEL) 400 MG tablet Take 400 mg by mouth at bedtime.   sucralfate (CARAFATE) 1 GM/10ML suspension Take 2 g by mouth 3 times/day as needed-between meals & bedtime.   SUMAtriptan (IMITREX) 100 MG tablet Take 100 mg by mouth every 2 (two) hours as needed  for migraine. May repeat in 2 hours if headache persists or recurs.   traMADol (ULTRAM) 50 MG tablet Take 50 mg by mouth 4 (four) times daily.   [DISCONTINUED] isosorbide mononitrate (IMDUR) 30 MG 24 hr tablet Take 0.5 tablets (15 mg total) by mouth daily.   [DISCONTINUED] ivabradine (CORLANOR) 5 MG TABS tablet Take 3 tablets (15 mg total) by mouth once for 1 dose. Take 2 hours prior to your CT Scan   [DISCONTINUED] metoprolol tartrate (LOPRESSOR) 100 MG tablet Take 1 tablet (100 mg total) by mouth once for 1 dose. Take 2 hours prior to your CT scan.     Allergies:   Penicillins   Social History   Socioeconomic History   Marital status: Single    Spouse name: Not on file   Number of children: Not on file   Years of education: Not on file   Highest education level: Not on file  Occupational History   Not on file  Tobacco Use   Smoking status: Every Day    Packs/day: 0.20    Years: 47.00    Total pack years: 9.40    Types: Cigarettes   Smokeless tobacco: Never   Tobacco comments:    Recently cut back to 0.5 PPD  Vaping Use   Vaping Use: Never used  Substance and Sexual Activity   Alcohol use: Not Currently   Drug use: Never   Sexual activity: Not on file  Other Topics Concern   Not on file  Social History Narrative   Not on file   Social Determinants of Health   Financial Resource Strain: Not on file  Food Insecurity: Not on file  Transportation Needs: Not on file  Physical Activity: Not on file  Stress: Not on file  Social Connections: Not on file     Family History: The patient's family history is not on file.  ROS:   Please see the history of present illness.     All other systems reviewed and are negative.  EKGs/Labs/Other Studies Reviewed:    The following studies were reviewed today:   EKG:  EKG is  ordered today.  The ekg ordered today demonstrates normal sinus rhythm  Recent Labs: 04/12/2021: ALT 9; BUN 16; Creatinine, Ser 1.31; Hemoglobin 9.0;  Platelets 257; Potassium 3.7; Sodium 138; TSH 6.167  Recent Lipid Panel No results found for: "CHOL", "TRIG", "HDL", "CHOLHDL", "VLDL", "LDLCALC", "LDLDIRECT"   Risk Assessment/Calculations:             Physical Exam:    VS:  BP 114/82 (BP Location: Right Arm)   Pulse 89   Ht 5' 8.5" (1.74 m)   Wt 113 lb 6.4 oz (51.4 kg)   SpO2 97%   BMI 16.99 kg/m     Wt Readings from Last 3  Encounters:  11/08/21 113 lb 6.4 oz (51.4 kg)  02/22/21 113 lb 1.5 oz (51.3 kg)  02/15/20 113 lb (51.3 kg)     GEN:  Well nourished, well developed in no acute distress HEENT: Normal NECK: No JVD; No carotid bruits CARDIAC: RRR, no murmurs, rubs, gallops RESPIRATORY:  Clear to auscultation without rales, wheezing or rhonchi  ABDOMEN: Soft, non-tender, non-distended MUSCULOSKELETAL:  No edema; No deformity  SKIN: Warm and dry NEUROLOGIC:  Alert and oriented x 3 PSYCHIATRIC:  Normal affect   ASSESSMENT:    1. Precordial pain   2. Coronary artery disease involving native coronary artery of native heart, unspecified whether angina present   3. Pericardial effusion   4. Smoking   5. Screening for hyperlipidemia    PLAN:    In order of problems listed above:  Chest pain, risk factors smoking.  Get echo, get coronary CTA.  Continue aspirin 81 mg, start Imdur 15 mg daily. Coronary calcifications on chest CT, echo and CTA as above.  Obtain fasting lipid profile. Small pericardial effusion, will evaluate with echo as above.  Current smoker, smoking cessation advised.  Follow-up after cardiac testing     Medication Adjustments/Labs and Tests Ordered: Current medicines are reviewed at length with the patient today.  Concerns regarding medicines are outlined above.  Orders Placed This Encounter  Procedures   CT CORONARY MORPH W/CTA COR W/SCORE W/CA W/CM &/OR WO/CM   CT CORONARY MORPH W/CTA COR W/SCORE W/CA W/CM &/OR WO/CM   Basic metabolic panel   Lipid panel   EKG 12-Lead   ECHOCARDIOGRAM  COMPLETE   Meds ordered this encounter  Medications   DISCONTD: ivabradine (CORLANOR) 5 MG TABS tablet    Sig: Take 3 tablets (15 mg total) by mouth once for 1 dose. Take 2 hours prior to your CT Scan    Dispense:  3 tablet    Refill:  0   DISCONTD: metoprolol tartrate (LOPRESSOR) 100 MG tablet    Sig: Take 1 tablet (100 mg total) by mouth once for 1 dose. Take 2 hours prior to your CT scan.    Dispense:  1 tablet    Refill:  0   DISCONTD: isosorbide mononitrate (IMDUR) 30 MG 24 hr tablet    Sig: Take 0.5 tablets (15 mg total) by mouth daily.    Dispense:  15 tablet    Refill:  3   isosorbide mononitrate (IMDUR) 30 MG 24 hr tablet    Sig: Take 0.5 tablets (15 mg total) by mouth daily.    Dispense:  15 tablet    Refill:  3   ivabradine (CORLANOR) 5 MG TABS tablet    Sig: Take 3 tablets (15 mg total) by mouth once for 1 dose. Take 2 hours prior to your CT Scan    Dispense:  3 tablet    Refill:  0   metoprolol tartrate (LOPRESSOR) 100 MG tablet    Sig: Take 1 tablet (100 mg total) by mouth once for 1 dose. Take 2 hours prior to your CT scan.    Dispense:  1 tablet    Refill:  0    Patient Instructions  Medication Instructions:   Your physician has recommended you make the following change in your medication:    START taking Isosorbide 15 MG once a day.  *If you need a refill on your cardiac medications before your next appointment, please call your pharmacy*   Lab Work:  Your physician recommends that you return for  a BMP and a FASTING lipid profile:  Prior to your CT Scan on 11/22/21  - You will need to be fasting. Please do not have anything to eat or drink after midnight the morning you have the lab work. You may only have water or black coffee with no cream or sugar.   - Please go to the [facility]. You will check in at the front desk to the right as you walk into the atrium. Valet Parking is offered if needed. - No appointment needed. You may go any day between  7 am and 6 pm.     Testing/Procedures:  Your physician has requested that you have an echocardiogram. Echocardiography is a painless test that uses sound waves to create images of your heart. It provides your doctor with information about the size and shape of your heart and how well your heart's chambers and valves are working. This procedure takes approximately one hour. There are no restrictions for this procedure. Please do NOT wear cologne, perfume, aftershave, or lotions (deodorant is allowed). Please arrive 15 minutes prior to your appointment time.   2.   Your physician has requested that you have cardiac CT. Cardiac computed tomography (CT) is a painless test that uses an x-ray machine to take clear, detailed pictures of your heart.    Your cardiac CT will be scheduled at:  [facility] [address] [place], [place] [address] [phone]  Thursday 11/22/21 at 8:00 AM  Please arrive 15 mins early for check-in and test prep.    Please follow these instructions carefully (unless otherwise directed):   Night Before the Test: Be sure to Drink plenty of water. Do not consume any caffeinated/decaffeinated beverages or chocolate 12 hours prior to your test.   On the Day of the Test: Drink plenty of water until 1 hour prior to the test. Do not eat any food 4 hours prior to the test. You may take your regular medications prior to the test.  Take metoprolol (Lopressor) 100 MG two hours prior to test. Take Ivabradine (Corlanor) 15 MG two hours prior to test. FEMALES- please wear underwire-free bra if available, avoid dresses & tight clothing        After the Test: Drink plenty of water. After receiving IV contrast, you may experience a mild flushed feeling. This is normal. On occasion, you may experience a mild rash up to 24 hours after the test. This is not dangerous. If this occurs, you can take Benadryl 25 mg and increase  your fluid intake. If you experience trouble breathing, this can be serious. If it is severe call 911 IMMEDIATELY. If it is mild, please call our office. If you take any of these medications: Glipizide/Metformin, Avandament, Glucavance, please do not take 48 hours after completing test unless otherwise instructed.  Please allow 2-4 weeks for scheduling of routine cardiac CTs. Some insurance companies require a pre-authorization which may delay scheduling of this test.   For non-scheduling related questions, please contact the cardiac imaging nurse navigator should you have any questions/concerns: Rockwell Alexandria, Cardiac Imaging Nurse Navigator Larey Brick, Cardiac Imaging Nurse Navigator [facility] Direct Office Dial: [phone]   For scheduling needs, including cancellations and rescheduling, please call [place], [phone].    Follow-Up: At [facility], you and your health needs are our priority.  As part of our continuing mission to provide you with exceptional heart care, we have created designated Provider  Care Teams.  These Care Teams include your primary Cardiologist (physician) and Advanced Practice Providers (APPs -  Physician Assistants and Nurse Practitioners) who all work together to provide you with the care you need, when you need it.  We recommend signing up for the patient portal called "MyChart".  Sign up information is provided on this After Visit Summary.  MyChart is used to connect with patients for Virtual Visits (Telemedicine).  Patients are able to view lab/test results, encounter notes, upcoming appointments, etc.  Non-urgent messages can be sent to your provider as well.   To learn more about what you can do with MyChart, go to [email or web address].    Your next appointment:   Follow up after testing   The format for your next appointment:   In Person  Provider:   You may see Debbe Odea, MD or one of the  following Advanced Practice Providers on your designated Care Team:   Nicolasa Ducking, NP Eula Listen, PA-C Cadence Fransico Michael, PA-C Charlsie Quest, NP    Other Instructions   Important Information About Sugar         Signed, Debbe Odea, MD  11/08/2021 10:48 AM    [facility]

## 2021-11-08 NOTE — Patient Instructions (Signed)
Medication Instructions:   Your physician has recommended you make the following change in your medication:    START taking Isosorbide 15 MG once a day.  *If you need a refill on your cardiac medications before your next appointment, please call your pharmacy*   Lab Work:  Your physician recommends that you return for a BMP and a FASTING lipid profile:  Prior to your CT Scan on 11/22/21  - You will need to be fasting. Please do not have anything to eat or drink after midnight the morning you have the lab work. You may only have water or black coffee with no cream or sugar.   - Please go to the [facility]. You will check in at the front desk to the right as you walk into the atrium. Valet Parking is offered if needed. - No appointment needed. You may go any day between 7 am and 6 pm.     Testing/Procedures:  Your physician has requested that you have an echocardiogram. Echocardiography is a painless test that uses sound waves to create images of your heart. It provides your doctor with information about the size and shape of your heart and how well your heart's chambers and valves are working. This procedure takes approximately one hour. There are no restrictions for this procedure. Please do NOT wear cologne, perfume, aftershave, or lotions (deodorant is allowed). Please arrive 15 minutes prior to your appointment time.   2.   Your physician has requested that you have cardiac CT. Cardiac computed tomography (CT) is a painless test that uses an x-ray machine to take clear, detailed pictures of your heart.    Your cardiac CT will be scheduled at:  [facility] [address] [place], [place] [address] [phone]  Thursday 11/22/21 at 8:00 AM  Please arrive 15 mins early for check-in and test prep.    Please follow these instructions carefully (unless otherwise directed):   Night Before the Test: Be sure to Drink plenty  of water. Do not consume any caffeinated/decaffeinated beverages or chocolate 12 hours prior to your test.   On the Day of the Test: Drink plenty of water until 1 hour prior to the test. Do not eat any food 4 hours prior to the test. You may take your regular medications prior to the test.  Take metoprolol (Lopressor) 100 MG two hours prior to test. Take Ivabradine (Corlanor) 15 MG two hours prior to test. FEMALES- please wear underwire-free bra if available, avoid dresses & tight clothing        After the Test: Drink plenty of water. After receiving IV contrast, you may experience a mild flushed feeling. This is normal. On occasion, you may experience a mild rash up to 24 hours after the test. This is not dangerous. If this occurs, you can take Benadryl 25 mg and increase your fluid intake. If you experience trouble breathing, this can be serious. If it is severe call 911 IMMEDIATELY. If it is mild, please call our office. If you take any of these medications: Glipizide/Metformin, Avandament, Glucavance, please do not take 48 hours after completing test unless otherwise instructed.  Please allow 2-4 weeks for scheduling of routine cardiac CTs. Some insurance companies require a pre-authorization which may delay scheduling of this test.   For non-scheduling related questions, please contact the cardiac imaging nurse navigator should you have any questions/concerns: Marchia Bond, Cardiac Imaging Nurse Navigator Gordy Clement, Cardiac Imaging Nurse Navigator [place]  [facility] Direct Office Dial: [phone]   For scheduling needs, including cancellations and rescheduling, please call [place], [phone].    Follow-Up: At [facility], you and your health needs are our priority.  As part of our continuing mission to provide you with exceptional heart care, we have created designated Provider Care Teams.  These Care Teams include your primary  Cardiologist (physician) and Advanced Practice Providers (APPs -  Physician Assistants and Nurse Practitioners) who all work together to provide you with the care you need, when you need it.  We recommend signing up for the patient portal called "MyChart".  Sign up information is provided on this After Visit Summary.  MyChart is used to connect with patients for Virtual Visits (Telemedicine).  Patients are able to view lab/test results, encounter notes, upcoming appointments, etc.  Non-urgent messages can be sent to your provider as well.   To learn more about what you can do with MyChart, go to [email or web address].    Your next appointment:   Follow up after testing   The format for your next appointment:   In Person  Provider:   You may see Debbe Odea, MD or one of the following Advanced Practice Providers on your designated Care Team:   Nicolasa Ducking, NP Eula Listen, PA-C Cadence Fransico Michael, PA-C Charlsie Quest, NP    Other Instructions   Important Information About Sugar

## 2021-11-21 ENCOUNTER — Telehealth (HOSPITAL_COMMUNITY): Payer: Self-pay | Admitting: *Deleted

## 2021-11-21 ENCOUNTER — Ambulatory Visit: Payer: 59

## 2021-11-21 NOTE — Telephone Encounter (Signed)
Attempted to call patient regarding upcoming cardiac CT appointment. °Left message on voicemail with name and callback number ° °Carlia Bomkamp RN Navigator Cardiac Imaging °[facility] °[phone] Office °[phone] Cell ° °

## 2021-11-22 ENCOUNTER — Ambulatory Visit: Admission: RE | Admit: 2021-11-22 | Payer: 59 | Source: Ambulatory Visit

## 2021-12-11 ENCOUNTER — Ambulatory Visit: Payer: 59 | Attending: Cardiology

## 2021-12-11 DIAGNOSIS — R072 Precordial pain: Secondary | ICD-10-CM | POA: Diagnosis not present

## 2021-12-11 LAB — ECHOCARDIOGRAM COMPLETE
AR max vel: 2.06 cm2
AV Area VTI: 2.4 cm2
AV Area mean vel: 2.15 cm2
AV Mean grad: 3 mmHg
AV Peak grad: 5.2 mmHg
Ao pk vel: 1.14 m/s
Area-P 1/2: 2.2 cm2
S' Lateral: 2.5 cm

## 2021-12-14 ENCOUNTER — Telehealth: Payer: Self-pay

## 2021-12-14 NOTE — Telephone Encounter (Signed)
Margrett Rud, [place] 12/14/2021  8:34 AM EST Back to Top    Called patient. No answer. LMOV.   Debbe Odea, MD 12/13/2021  5:44 PM EST     Echo shows normal systolic function, no findings to suggest etiology of chest pain.

## 2021-12-19 ENCOUNTER — Telehealth: Payer: Self-pay | Admitting: Cardiology

## 2021-12-19 NOTE — Telephone Encounter (Signed)
Returned the call the patient. She has a follow up with Dr. Azucena Cecil on 12/18. She is wondering if she can move the appointment out since she has not had her CT nor labs completed yet.

## 2021-12-19 NOTE — Telephone Encounter (Signed)
Patient called to talk with Dr. Sandie Ano or nurse in regards to bloodwork. Please call back

## 2021-12-19 NOTE — Telephone Encounter (Signed)
Tammy Maxwell can you please reschedule patients follow up appt until after testing and labs are completed.

## 2021-12-20 NOTE — Telephone Encounter (Signed)
Called patient to verify if she would like to reschedule her Cardiac CTA. No answer. LMOV to call back.

## 2021-12-21 NOTE — Telephone Encounter (Signed)
Spoke with patient.   Patient reports that she would like to reschedule her Cardiac CTA. Made patient aware that I would reach out to the scheduler for that and make her aware and we would reschedule her follow up appt until after her Cardiac CTA.   Patient reports that she still have the metoprolol and Corlanor along with all the instructions.   Patient was appreciative of the call.

## 2021-12-21 NOTE — Telephone Encounter (Signed)
Patient is returning call.  °

## 2021-12-24 ENCOUNTER — Ambulatory Visit: Payer: 59 | Admitting: Cardiology

## 2022-01-22 ENCOUNTER — Telehealth (HOSPITAL_COMMUNITY): Payer: Self-pay | Admitting: *Deleted

## 2022-01-22 NOTE — Telephone Encounter (Signed)
Attempted to call patient regarding upcoming cardiac CT appointment. °Left message on voicemail with name and callback number ° °Nardos Putnam RN Navigator Cardiac Imaging °[facility] °[phone] Office °[phone] Cell ° °

## 2022-01-24 ENCOUNTER — Ambulatory Visit
Admission: RE | Admit: 2022-01-24 | Discharge: 2022-01-24 | Disposition: A | Discharge status: Home / Self Care | Payer: 59 | Source: Ambulatory Visit | Attending: Cardiology | Admitting: Cardiology

## 2022-01-24 DIAGNOSIS — R072 Precordial pain: Secondary | ICD-10-CM | POA: Diagnosis present

## 2022-01-24 MED ORDER — NITROGLYCERIN 0.4 MG SL SUBL
0.8000 mg | SUBLINGUAL_TABLET | Freq: Once | SUBLINGUAL | Status: AC
Start: 2022-01-24 — End: 2022-01-24
  Administered 2022-01-24: 0.8 mg via SUBLINGUAL

## 2022-01-24 MED ORDER — IOHEXOL 350 MG/ML SOLN
75.0000 mL | Freq: Once | INTRAVENOUS | Status: AC | PRN
  Administered 2022-01-24: 75 mL via INTRAVENOUS

## 2022-01-24 NOTE — Progress Notes (Signed)
Patient tolerated procedure well. Ambulate w/o difficulty. Denies light headedness or being dizzy. Sitting in chair drinking water provided. Encouraged to drink extra water today and reasoning explained. Verbalized understanding. All questions answered. ABC intact. No further needs. Discharge from procedure area w/o issues.   °

## 2022-02-06 ENCOUNTER — Other Ambulatory Visit: Payer: Self-pay

## 2022-02-06 MED ORDER — ISOSORBIDE MONONITRATE ER 30 MG PO TB24: 15.0000 mg | ORAL_TABLET | Freq: Every day | ORAL | 0 refills | Status: DC

## 2022-02-06 MED ORDER — ISOSORBIDE MONONITRATE ER 30 MG PO TB24
15.0000 mg | ORAL_TABLET | Freq: Every day | ORAL | 0 refills | Status: DC
Start: 2022-02-06 — End: 2022-02-06

## 2022-02-13 ENCOUNTER — Ambulatory Visit: Payer: 59 | Attending: Cardiology | Admitting: Cardiology

## 2022-02-13 ENCOUNTER — Other Ambulatory Visit
Admission: RE | Admit: 2022-02-13 | Discharge: 2022-02-13 | Disposition: A | Discharge status: Home / Self Care | Payer: 59 | Source: Ambulatory Visit | Attending: Cardiology | Admitting: Cardiology

## 2022-02-13 ENCOUNTER — Encounter: Payer: Self-pay | Admitting: Cardiology

## 2022-02-13 VITALS — BP 130/80 | HR 77 | Ht 68.5 in | Wt 118.5 lb

## 2022-02-13 DIAGNOSIS — J984 Other disorders of lung: Secondary | ICD-10-CM

## 2022-02-13 DIAGNOSIS — R072 Precordial pain: Secondary | ICD-10-CM | POA: Diagnosis present

## 2022-02-13 DIAGNOSIS — F17200 Nicotine dependence, unspecified, uncomplicated: Secondary | ICD-10-CM | POA: Diagnosis not present

## 2022-02-13 DIAGNOSIS — I3139 Other pericardial effusion (noninflammatory): Secondary | ICD-10-CM | POA: Diagnosis not present

## 2022-02-13 DIAGNOSIS — I2510 Atherosclerotic heart disease of native coronary artery without angina pectoris: Secondary | ICD-10-CM

## 2022-02-13 DIAGNOSIS — Z13220 Encounter for screening for lipoid disorders: Secondary | ICD-10-CM | POA: Diagnosis present

## 2022-02-13 LAB — LIPID PANEL
Cholesterol: 187 mg/dL (ref 0–200)
HDL: 73 mg/dL (ref >40)
LDL Cholesterol: 97 mg/dL (ref 0–99)
Total CHOL/HDL Ratio: 2.6 RATIO
Triglycerides: 87 mg/dL (ref <150)
VLDL: 17 mg/dL (ref 0–40)

## 2022-02-13 LAB — BASIC METABOLIC PANEL
Anion gap: 9 (ref 5–15)
BUN: 13 mg/dL (ref 6–20)
CO2: 26 mmol/L (ref 22–32)
Calcium: 9.3 mg/dL (ref 8.9–10.3)
Chloride: 102 mmol/L (ref 98–111)
Creatinine, Ser: 1.29 mg/dL — ABNORMAL HIGH (ref 0.44–1.00)
GFR, Estimated: 48 mL/min — ABNORMAL LOW (ref >60)
Glucose, Bld: 89 mg/dL (ref 70–99)
Potassium: 4.6 mmol/L (ref 3.5–5.1)
Sodium: 137 mmol/L (ref 135–145)

## 2022-02-13 NOTE — Patient Instructions (Addendum)
Medication Instructions:   Your physician recommends that you continue on your current medications as directed. Please refer to the Current Medication list given to you today.  *If you need a refill on your cardiac medications before your next appointment, please call your pharmacy*   Lab Work:  Your physician recommends you go to the medical mall for fasting labs.   If you have labs (blood work) drawn today and your tests are completely normal, you will receive your results only by: [place] (if you have MyChart) OR A paper copy in the mail If you have any lab test that is abnormal or we need to change your treatment, we will call you to review the results.   Testing/Procedures:  None Ordered   Follow-Up: At [facility], you and your health needs are our priority.  As part of our continuing mission to provide you with exceptional heart care, we have created designated Provider Care Teams.  These Care Teams include your primary Cardiologist (physician) and Advanced Practice Providers (APPs -  Physician Assistants and Nurse Practitioners) who all work together to provide you with the care you need, when you need it.  We recommend signing up for the patient portal called "MyChart".  Sign up information is provided on this After Visit Summary.  MyChart is used to connect with patients for Virtual Visits (Telemedicine).  Patients are able to view lab/test results, encounter notes, upcoming appointments, etc.  Non-urgent messages can be sent to your provider as well.   To learn more about what you can do with MyChart, go to [email or web address].    Your next appointment:   6 month(s)  Provider:   You may see Kate Sable, MD or one of the following Advanced Practice Providers on your designated Care Team:   Murray Hodgkins, NP Christell Faith, PA-C Cadence Kathlen Mody, PA-C Gerrie Nordmann, NP

## 2022-02-13 NOTE — Progress Notes (Signed)
Cardiology Office Note:    Date:  02/13/2022   ID:  Tammy Maxwell, DOB [birth year], MRN [id number]  PCP:  Sharol Roussel, MD   [place] Providers Cardiologist:  Kate Sable, MD     Referring MD: Sharol Roussel, MD   Chief Complaint  Patient presents with   Other    F/u calcium CT score. Meds reviewed verbally with pt.    History of Present Illness:    Tammy Maxwell is a 62 y.o. female with a hx of CAD (minimal LAD and RCA stenosis), GERD, current smoker x40+ years, COPD who presents for follow-up.  Previously seen due to chest pain and pericardial effusion.  Echo and coronary CTA obtained to evaluate obstructive disease, and pericardial effusion.  Doing okay, denies any new concerns.  Echo showed normal systolic function, coronary CTA showed nonobstructive CAD.  Left lung scar noted on CT chest.  Small effusion noted on echo, no evidence for tamponade.  She still smokes.  Prior notes Echo 12/2021 EF 50 to 55%. Coronary CT 01/2022 calcium score 232, minimal proximal LAD and RCA stenosis.  Past Medical History:  Diagnosis Date   Anemia    Arthritis    Bronchitis    Chest pain    COPD (chronic obstructive pulmonary disease) (HCC)    Depression    Dysrhythmia    GERD (gastroesophageal reflux disease)    Headache    Heart murmur    History of hiatal hernia    Pneumonia    Pneumothorax    Wears dentures    full upper    Past Surgical History:  Procedure Laterality Date   APPENDECTOMY     CATARACT EXTRACTION W/PHACO Right 08/19/2019   Procedure: CATARACT EXTRACTION PHACO AND INTRAOCULAR LENS PLACEMENT ([place]) RIGHT;  Surgeon: Eulogio Bear, MD;  Location: [facility] ORS;  Service: Ophthalmology;  Laterality: Right;  [place] 00:14.5 CDE 0.90 Fluid Pack lot # [id number]   CATARACT EXTRACTION W/PHACO Left 09/20/2019   Procedure: CATARACT EXTRACTION PHACO AND INTRAOCULAR LENS PLACEMENT (I[facility]) LEFT 1.27  00:13.7;  Surgeon: Eulogio Bear, MD;  Location: [place];  Service: Ophthalmology;  Laterality: Left;   COLONOSCOPY     COLONOSCOPY WITH PROPOFOL N/A 02/15/2020   Procedure: COLONOSCOPY WITH PROPOFOL;  Surgeon: Jonathon Bellows, MD;  Location: [facility] ENDOSCOPY;  Service: Gastroenterology;  Laterality: N/A;   ESOPHAGOGASTRODUODENOSCOPY (EGD) WITH PROPOFOL N/A 02/15/2020   Procedure: ESOPHAGOGASTRODUODENOSCOPY (EGD) WITH PROPOFOL;  Surgeon: Jonathon Bellows, MD;  Location: [facility] ENDOSCOPY;  Service: Gastroenterology;  Laterality: N/A;   LUNG LOBECTOMY Left    OOPHORECTOMY     ROTATOR CUFF REPAIR     SHOULDER ARTHROSCOPY Right     Current Medications: Current Meds  Medication Sig   albuterol (PROVENTIL) (2.5 MG/3ML) 0.083% nebulizer solution Take 2.5 mg by nebulization 4 (four) times daily.   aspirin 81 MG chewable tablet Chew 81 mg by mouth daily.   budesonide-formoterol (SYMBICORT) 160-4.5 MCG/ACT inhaler Inhale 2 puffs into the lungs 2 (two) times daily.   buPROPion (WELLBUTRIN SR) 150 MG 12 hr tablet Take 150 mg by mouth 2 (two) times daily.   carisoprodol (SOMA) 350 MG tablet Take 350 mg by mouth 3 (three) times daily as needed for muscle spasms.   clonazePAM (KLONOPIN) 1 MG tablet Take 1 mg by mouth at bedtime.   IPRATROPIUM BROMIDE HFA IN Inhale 2 puffs into the lungs 4 (four) times daily.   isosorbide mononitrate (IMDUR) 30 MG 24 hr tablet Take 0.5 tablets (  15 mg total) by mouth daily.   meloxicam (MOBIC) 15 MG tablet Take 15 mg by mouth at bedtime.   nicotine (NICODERM CQ - DOSED IN MG/24 HOURS) 14 mg/24hr patch Place 14 mg onto the skin daily.   nitroGLYCERIN (NITROSTAT) 0.3 MG SL tablet Place 0.3 mg under the tongue every 5 (five) minutes as needed for chest pain.   omeprazole (PRILOSEC) 20 MG capsule Take 20 mg by mouth 2 (two) times daily before a meal.   PARoxetine (PAXIL) 20 MG tablet Take 20 mg by mouth at bedtime.   QUEtiapine (SEROQUEL) 400 MG tablet Take 400 mg by mouth at bedtime.   sucralfate (CARAFATE) 1 GM/10ML suspension Take  2 g by mouth 3 times/day as needed-between meals & bedtime.   SUMAtriptan (IMITREX) 100 MG tablet Take 100 mg by mouth every 2 (two) hours as needed for migraine. May repeat in 2 hours if headache persists or recurs.   traMADol (ULTRAM) 50 MG tablet Take 50 mg by mouth 4 (four) times daily.     Allergies:   Penicillins   Social History   Socioeconomic History   Marital status: Single    Spouse name: Not on file   Number of children: Not on file   Years of education: Not on file   Highest education level: Not on file  Occupational History   Not on file  Tobacco Use   Smoking status: Every Day    Packs/day: 0.20    Years: 47.00    Total pack years: 9.40    Types: Cigarettes   Smokeless tobacco: Never   Tobacco comments:    Recently cut back to 0.5 PPD  Vaping Use   Vaping Use: Never used  Substance and Sexual Activity   Alcohol use: Not Currently   Drug use: Never   Sexual activity: Not on file  Other Topics Concern   Not on file  Social History Narrative   Not on file   Social Determinants of Health   Financial Resource Strain: Not on file  Food Insecurity: Not on file  Transportation Needs: Not on file  Physical Activity: Not on file  Stress: Not on file  Social Connections: Not on file     Family History: The patient's family history is not on file.  ROS:   Please see the history of present illness.     All other systems reviewed and are negative.  EKGs/Labs/Other Studies Reviewed:    The following studies were reviewed today:   EKG:  EKG not ordered today.   Recent Labs: 04/12/2021: ALT 9; BUN 16; Creatinine, Ser 1.31; Hemoglobin 9.0; Platelets 257; Potassium 3.7; Sodium 138; TSH 6.167  Recent Lipid Panel No results found for: "CHOL", "TRIG", "HDL", "CHOLHDL", "VLDL", "LDLCALC", "LDLDIRECT"   Risk Assessment/Calculations:             Physical Exam:    VS:  BP 130/80 (BP Location: Left Arm, Patient Position: Sitting, Cuff Size: Normal)   Pulse  77   Ht 5' 8.5" (1.74 m)   Wt 118 lb 8 oz (53.8 kg)   SpO2 96%   BMI 17.76 kg/m     Wt Readings from Last 3 Encounters:  02/13/22 118 lb 8 oz (53.8 kg)  11/08/21 113 lb 6.4 oz (51.4 kg)  02/22/21 113 lb 1.5 oz (51.3 kg)     GEN:  Well nourished, well developed in no acute distress HEENT: Normal NECK: No JVD; No carotid bruits CARDIAC: RRR, no murmurs, rubs, gallops  RESPIRATORY:  Clear to auscultation without rales, wheezing or rhonchi  ABDOMEN: Soft, non-tender, non-distended MUSCULOSKELETAL:  No edema; No deformity  SKIN: Warm and dry NEUROLOGIC:  Alert and oriented x 3 PSYCHIATRIC:  Normal affect   ASSESSMENT:    1. Coronary artery disease involving native coronary artery of native heart, unspecified whether angina present   2. Pericardial effusion   3. Smoking   4. Pulmonary scarring     PLAN:    In order of problems listed above:  CAD, minimal proximal LAD and RCA stenosis.  Calcium score 232.  EF 55% obtain fasting lipid profile. Small pericardial effusion, no evidence for tamponade.    Current smoker, smoking cessation advised. Pulmonary scarring, emphysema.  Refer to pulmonary medicine.  Follow-up in 6 months.     Medication Adjustments/Labs and Tests Ordered: Current medicines are reviewed at length with the patient today.  Concerns regarding medicines are outlined above.  Orders Placed This Encounter  Procedures   Ambulatory referral to Pulmonology   No orders of the defined types were placed in this encounter.   Patient Instructions  Medication Instructions:   Your physician recommends that you continue on your current medications as directed. Please refer to the Current Medication list given to you today.  *If you need a refill on your cardiac medications before your next appointment, please call your pharmacy*   Lab Work:  Your physician recommends you go to the medical mall for fasting labs.   If you have labs (blood work) drawn today and  your tests are completely normal, you will receive your results only by: [place] (if you have MyChart) OR A paper copy in the mail If you have any lab test that is abnormal or we need to change your treatment, we will call you to review the results.   Testing/Procedures:  None Ordered   Follow-Up: At [facility], you and your health needs are our priority.  As part of our continuing mission to provide you with exceptional heart care, we have created designated Provider Care Teams.  These Care Teams include your primary Cardiologist (physician) and Advanced Practice Providers (APPs -  Physician Assistants and Nurse Practitioners) who all work together to provide you with the care you need, when you need it.  We recommend signing up for the patient portal called "MyChart".  Sign up information is provided on this After Visit Summary.  MyChart is used to connect with patients for Virtual Visits (Telemedicine).  Patients are able to view lab/test results, encounter notes, upcoming appointments, etc.  Non-urgent messages can be sent to your provider as well.   To learn more about what you can do with MyChart, go to [email or web address].    Your next appointment:   6 month(s)  Provider:   You may see Kate Sable, MD or one of the following Advanced Practice Providers on your designated Care Team:   Murray Hodgkins, NP Christell Faith, PA-C Cadence Kathlen Mody, PA-C Gerrie Nordmann, NP   Signed, Kate Sable, MD  02/13/2022 10:46 AM    [place]

## 2022-03-01 ENCOUNTER — Ambulatory Visit: Payer: 59

## 2022-03-05 ENCOUNTER — Institutional Professional Consult (permissible substitution): Payer: 59 | Admitting: Pulmonary Disease

## 2022-03-12 ENCOUNTER — Inpatient Hospital Stay: Payer: 59

## 2022-03-12 ENCOUNTER — Inpatient Hospital Stay: Payer: 59 | Admitting: Internal Medicine

## 2022-03-15 ENCOUNTER — Telehealth: Payer: Self-pay | Admitting: *Deleted

## 2022-03-15 NOTE — Telephone Encounter (Signed)
Nurse placed call to patient to review appointment details for upcoming new hematology consultation visit. Patient did not answer, nurse left voicemail regarding appointment details. Encouraged to call with any questions or concerns.

## 2022-03-18 ENCOUNTER — Encounter: Payer: Self-pay | Admitting: Internal Medicine

## 2022-03-18 ENCOUNTER — Inpatient Hospital Stay: Payer: 59

## 2022-03-18 ENCOUNTER — Inpatient Hospital Stay: Payer: 59 | Attending: Internal Medicine | Admitting: Internal Medicine

## 2022-03-18 VITALS — BP 142/87 | HR 78 | Temp 96.7°F | Resp 17 | Ht 68.5 in | Wt 119.0 lb

## 2022-03-18 DIAGNOSIS — E538 Deficiency of other specified B group vitamins: Secondary | ICD-10-CM | POA: Insufficient documentation

## 2022-03-18 DIAGNOSIS — D509 Iron deficiency anemia, unspecified: Secondary | ICD-10-CM | POA: Diagnosis not present

## 2022-03-18 DIAGNOSIS — F17210 Nicotine dependence, cigarettes, uncomplicated: Secondary | ICD-10-CM | POA: Insufficient documentation

## 2022-03-18 NOTE — Progress Notes (Signed)
[place]  [phone] [phone]) [phone]  ID: Tammy Maxwell OB: 01-15-1960  MR#: [id number]  [id number]  Patient Care Team: Sharol Roussel, MD as PCP - General (Family Medicine) Kate Sable, MD as PCP - Cardiology (Cardiology)  REFERRING PROVIDER: Dr. Doristine Johns  REASON FOR REFERRAL: Iron deficiency anemia  HPI: Tammy Maxwell is a 62 y.o. female with past medical history of COPD, GERD, depression, anemia, arthritis was referred to hematology for management of iron deficiency anemia.  Patient has iron deficiency anemia at least since April 2023.  Her ferritin at that time was 3.  She was started on oral iron but could not tolerate due to upset stomach.  Her last colonoscopy and endoscopy was in 2022 which was done for constipation and dysphagia. Endoscopy showed diffuse white plaques in the upper third of the esophagus.  Otherwise was normal.  Pathology showed benign squamous mucosa.  KOH prep was positive for yeast infection.  Colonoscopy was optimal due to poor preparation.  Patient recently had blood work done in January 2024.  Hemoglobin 9.7, ferritin 5, B12 184.  She feels fatigued.  Denies any melanotic stools or blood in urine.  She is taking ibuprofen 3-4 times a day for past 1 year due to diffuse pain in her joints and body.  She is unable to tolerate Tylenol.  Gives her headache.  REVIEW OF SYSTEMS:   ROS  As per HPI. Otherwise, a complete review of systems is negative.  PAST MEDICAL HISTORY: Past Medical History:  Diagnosis Date   Anemia    Arthritis    Bronchitis    Chest pain    COPD (chronic obstructive pulmonary disease) (HCC)    Depression    Dysrhythmia    GERD (gastroesophageal reflux disease)    Headache    Heart murmur    History of hiatal hernia    Pneumonia    Pneumothorax    Wears dentures    full upper    PAST SURGICAL HISTORY: Past Surgical History:  Procedure Laterality Date   APPENDECTOMY      CATARACT EXTRACTION W/PHACO Right 08/19/2019   Procedure: CATARACT EXTRACTION PHACO AND INTRAOCULAR LENS PLACEMENT ([place]) RIGHT;  Surgeon: Eulogio Bear, MD;  Location: [facility] ORS;  Service: Ophthalmology;  Laterality: Right;  [place] 00:14.5 CDE 0.90 Fluid Pack lot # [id number]   CATARACT EXTRACTION W/PHACO Left 09/20/2019   Procedure: CATARACT EXTRACTION PHACO AND INTRAOCULAR LENS PLACEMENT (IOC) LEFT 1.27  00:13.7;  Surgeon: Eulogio Bear, MD;  Location: [place];  Service: Ophthalmology;  Laterality: Left;   COLONOSCOPY     COLONOSCOPY WITH PROPOFOL N/A 02/15/2020   Procedure: COLONOSCOPY WITH PROPOFOL;  Surgeon: Jonathon Bellows, MD;  Location: [facility] ENDOSCOPY;  Service: Gastroenterology;  Laterality: N/A;   ESOPHAGOGASTRODUODENOSCOPY (EGD) WITH PROPOFOL N/A 02/15/2020   Procedure: ESOPHAGOGASTRODUODENOSCOPY (EGD) WITH PROPOFOL;  Surgeon: Jonathon Bellows, MD;  Location: [facility] ENDOSCOPY;  Service: Gastroenterology;  Laterality: N/A;   LUNG LOBECTOMY Left    OOPHORECTOMY     ROTATOR CUFF REPAIR     SHOULDER ARTHROSCOPY Right     FAMILY HISTORY: Family History  Problem Relation Age of Onset   Stroke Mother    Hypertension Mother    Diabetes Mother    Clotting disorder Mother    Cancer Mother    Anesthesia problems Mother    Stroke Father    Hypertension Father    Diabetes Father    Cancer Father    Clotting disorder Father  Anesthesia problems Father    Hypertension Sister    Diabetes Sister    Hypertension Brother    Parkinson's disease Maternal Grandfather     HEALTH MAINTENANCE: Social History   Tobacco Use   Smoking status: Every Day    Packs/day: 0.50    Years: 47.00    Total pack years: 23.50    Types: Cigarettes   Smokeless tobacco: Never   Tobacco comments:    Recently cut back to 0.5 PPD  Vaping Use   Vaping Use: Never used  Substance Use Topics   Alcohol use: Not Currently   Drug use: Not Currently     Allergies  Allergen Reactions   Codeine  Anaphylaxis   Penicillins Anaphylaxis    Current Outpatient Medications  Medication Sig Dispense Refill   albuterol (PROVENTIL) (2.5 MG/3ML) 0.083% nebulizer solution Take 2.5 mg by nebulization 4 (four) times daily.     aspirin 81 MG chewable tablet Chew 81 mg by mouth daily.     budesonide-formoterol (SYMBICORT) 160-4.5 MCG/ACT inhaler Inhale 2 puffs into the lungs 2 (two) times daily.     buPROPion (WELLBUTRIN SR) 150 MG 12 hr tablet Take 150 mg by mouth 2 (two) times daily.     carisoprodol (SOMA) 350 MG tablet Take 350 mg by mouth 3 (three) times daily as needed for muscle spasms.     cholecalciferol (VITAMIN D3) 25 MCG (1000 UNIT) tablet Take 1,000 Units by mouth daily.     clonazePAM (KLONOPIN) 1 MG tablet Take 1 mg by mouth at bedtime.     cyanocobalamin (VITAMIN B12) 500 MCG tablet Take 500 mcg by mouth daily.     ferrous sulfate 324 MG TBEC Take 324 mg by mouth.     IPRATROPIUM BROMIDE HFA IN Inhale 2 puffs into the lungs 4 (four) times daily.     isosorbide mononitrate (IMDUR) 30 MG 24 hr tablet Take 0.5 tablets (15 mg total) by mouth daily. 45 tablet 0   levothyroxine (SYNTHROID) 50 MCG tablet Take 50 mcg by mouth daily.     meloxicam (MOBIC) 15 MG tablet Take 15 mg by mouth at bedtime.     naloxone (NARCAN) nasal spray 4 mg/0.1 mL SMARTSIG:Both Nares     nicotine (NICODERM CQ - DOSED IN MG/24 HOURS) 14 mg/24hr patch Place 14 mg onto the skin daily.     nitrofurantoin, macrocrystal-monohydrate, (MACROBID) 100 MG capsule Take 100 mg by mouth 2 (two) times daily.     nitroGLYCERIN (NITROSTAT) 0.3 MG SL tablet Place 0.3 mg under the tongue every 5 (five) minutes as needed for chest pain.     omeprazole (PRILOSEC) 20 MG capsule Take 20 mg by mouth 2 (two) times daily before a meal.     PARoxetine (PAXIL) 20 MG tablet Take 20 mg by mouth at bedtime.     QUEtiapine (SEROQUEL) 400 MG tablet Take 400 mg by mouth at bedtime.     rosuvastatin (CRESTOR) 10 MG tablet Take 10 mg by mouth  daily.     sucralfate (CARAFATE) 1 GM/10ML suspension Take 2 g by mouth 3 times/day as needed-between meals & bedtime.     SUMAtriptan (IMITREX) 100 MG tablet Take 100 mg by mouth every 2 (two) hours as needed for migraine. May repeat in 2 hours if headache persists or recurs.     traMADol (ULTRAM) 50 MG tablet Take 50 mg by mouth 4 (four) times daily.     Vitamin D, Ergocalciferol, (DRISDOL) 1.25 MG (50000 UNIT) CAPS capsule  Take 50,000 Units by mouth once a week.     No current facility-administered medications for this visit.    OBJECTIVE: Vitals:   03/18/22 1133  BP: (!) 142/87  Pulse: 78  Resp: 17  Temp: (!) 96.7 F (35.9 C)  SpO2: 100%     Body mass index is 17.83 kg/m.      General: Well-developed, well-nourished, no acute distress. Eyes: Pink conjunctiva, anicteric sclera. HEENT: Normocephalic, moist mucous membranes, clear oropharnyx. Lungs: Clear to auscultation bilaterally. Heart: Regular rate and rhythm. No rubs, murmurs, or gallops. Abdomen: Soft, nontender, nondistended. No organomegaly noted, normoactive bowel sounds. Musculoskeletal: No edema, cyanosis, or clubbing. Neuro: Alert, answering all questions appropriately. Cranial nerves grossly intact. Skin: No rashes or petechiae noted. Psych: Normal affect. Lymphatics: No cervical, calvicular, axillary or inguinal LAD.   LAB RESULTS:  Lab Results  Component Value Date   NA 137 02/13/2022   K 4.6 02/13/2022   CL 102 02/13/2022   CO2 26 02/13/2022   GLUCOSE 89 02/13/2022   BUN 13 02/13/2022   CREATININE 1.29 (H) 02/13/2022   CALCIUM 9.3 02/13/2022   PROT 6.9 04/12/2021   ALBUMIN 3.6 04/12/2021   AST 20 04/12/2021   ALT 9 04/12/2021   ALKPHOS 120 04/12/2021   BILITOT 0.6 04/12/2021   GFRNONAA 48 (L) 02/13/2022   GFRAA >60 10/06/2017    Lab Results  Component Value Date   WBC 6.3 04/12/2021   NEUTROABS 3.6 04/12/2021   HGB 9.0 (L) 04/12/2021   HCT 30.0 (L) 04/12/2021   MCV 73.3 (L) 04/12/2021    PLT 257 04/12/2021    Lab Results  Component Value Date   FERRITIN 3 (L) 04/12/2021     STUDIES: No results found.  ASSESSMENT AND PLAN:   Tammy Maxwell is a 62 y.o. female with pmh of COPD, GERD, depression, anemia, arthritis was referred to hematology for management of iron deficiency anemia.  # Iron deficiency anemia -Progressive.  Could not tolerate oral iron due to upset stomach. -Labs from January 2024 hemoglobin 9.7 and ferritin 5.  Discussed about IV Venofer 200 mg weekly x 5 doses.  Occasional side effects such as nausea and back pain was discussed.  Low but potential risk of anaphylactic reaction was discussed.  -Referral to GI with Dr. Vicente Males to rule out any microscopic GI bleed. She is taking ibuprofen 3-4 times a day for past 1 year due to diffuse pain in her joints and body.  She is unable to tolerate Tylenol.  Gives her headache.  I discussed with the patient about NSAIDs causing gastric erosions and bleed.  Will wait for GI evaluation but she may need to come off NSAIDs and transition to other pain regimen.  Will defer to PCP.  # B12 deficiency -Recent B12 level 184.  Will proceed with IM 1000 mcg injection weekly x 4 then start maintenance oral B12 1000 mcg daily  Schedule for IV Venofer weekly x 4 B12 injection weekly x 4 RTC in 4 months for MD visit, labs.  Patient expressed understanding and was in agreement with this plan. She also understands that She can call clinic at any time with any questions, concerns, or complaints.   I spent a total of 45 minutes reviewing chart data, face-to-face evaluation with the patient, counseling and coordination of care as detailed above.  Jane Canary, MD   03/18/2022 2:11 PM

## 2022-03-18 NOTE — Patient Instructions (Signed)
Start vitamin b12 1000 mcg daily after you finish b12 injections.

## 2022-03-18 NOTE — Progress Notes (Signed)
No concerns at this.

## 2022-03-21 MED FILL — Iron Sucrose Inj 20 MG/ML (Fe Equiv): INTRAVENOUS | Qty: 10 | Status: AC

## 2022-03-22 ENCOUNTER — Inpatient Hospital Stay: Payer: 59

## 2022-03-22 VITALS — BP 116/80 | HR 91 | Temp 97.0°F | Resp 18

## 2022-03-22 DIAGNOSIS — D509 Iron deficiency anemia, unspecified: Secondary | ICD-10-CM | POA: Diagnosis not present

## 2022-03-22 MED ORDER — SODIUM CHLORIDE 0.9 % IV SOLN
Freq: Once | INTRAVENOUS | Status: AC
  Filled 2022-03-22: qty 250

## 2022-03-22 MED ORDER — CYANOCOBALAMIN 1000 MCG/ML IJ SOLN
1000.0000 ug | Freq: Once | INTRAMUSCULAR | Status: AC
  Administered 2022-03-22: 1000 ug via INTRAMUSCULAR
  Filled 2022-03-22: qty 1

## 2022-03-22 MED ORDER — SODIUM CHLORIDE 0.9 % IV SOLN
200.0000 mg | Freq: Once | INTRAVENOUS | Status: AC
  Administered 2022-03-22: 200 mg via INTRAVENOUS
  Filled 2022-03-22: qty 200

## 2022-03-22 NOTE — Patient Instructions (Signed)
Vitamin B12 Injection What is this medication? Vitamin B12 (VAHY tuh min B12) prevents and treats low vitamin B12 levels in your body. It is used in people who do not get enough vitamin B12 from their diet or when their digestive tract does not absorb enough. Vitamin B12 plays an important role in maintaining the health of your nervous system and red blood cells. This medicine may be used for other purposes; ask your health care provider or pharmacist if you have questions. COMMON BRAND NAME(S): B-12 Compliance Kit, B-12 Injection Kit, Cyomin, Dodex, LA-12, Nutri-Twelve, Physicians EZ Use B-12, Primabalt What should I tell my care team before I take this medication? They need to know if you have any of these conditions: Kidney disease Leber's disease Megaloblastic anemia An unusual or allergic reaction to cyanocobalamin, cobalt, other medications, foods, dyes, or preservatives Pregnant or trying to get pregnant Breast-feeding How should I use this medication? This medication is injected into a muscle or deeply under the skin. It is usually given in a clinic or care team's office. However, your care team may teach you how to inject yourself. Follow all instructions. Talk to your care team about the use of this medication in children. Special care may be needed. Overdosage: If you think you have taken too much of this medicine contact a poison control center or emergency room at once. NOTE: This medicine is only for you. Do not share this medicine with others. What if I miss a dose? If you are given your dose at a clinic or care team's office, call to reschedule your appointment. If you give your own injections, and you miss a dose, take it as soon as you can. If it is almost time for your next dose, take only that dose. Do not take double or extra doses. What may interact with this medication? Alcohol Colchicine This list may not describe all possible interactions. Give your health care  provider a list of all the medicines, herbs, non-prescription drugs, or dietary supplements you use. Also tell them if you smoke, drink alcohol, or use illegal drugs. Some items may interact with your medicine. What should I watch for while using this medication? Visit your care team regularly. You may need blood work done while you are taking this medication. You may need to follow a special diet. Talk to your care team. Limit your alcohol intake and avoid smoking to get the best benefit. What side effects may I notice from receiving this medication? Side effects that you should report to your care team as soon as possible: Allergic reactions--skin rash, itching, hives, swelling of the face, lips, tongue, or throat Swelling of the ankles, hands, or feet Trouble breathing Side effects that usually do not require medical attention (report to your care team if they continue or are bothersome): Diarrhea This list may not describe all possible side effects. Call your doctor for medical advice about side effects. You may report side effects to FDA at 1-800-FDA-1088. Where should I keep my medication? Keep out of the reach of children. Store at room temperature between 15 and 30 degrees C (59 and 85 degrees F). Protect from light. Throw away any unused medication after the expiration date. NOTE: This sheet is a summary. It may not cover all possible information. If you have questions about this medicine, talk to your doctor, pharmacist, or health care provider.  2023 Elsevier/Gold Standard (2020-09-05 00:00:00) Iron Sucrose Injection What is this medication? IRON SUCROSE (EYE ern SOO krose) treats  low levels of iron (iron deficiency anemia) in people with kidney disease. Iron is a mineral that plays an important role in making red blood cells, which carry oxygen from your lungs to the rest of your body. This medicine may be used for other purposes; ask your health care provider or pharmacist if you  have questions. COMMON BRAND NAME(S): Venofer What should I tell my care team before I take this medication? They need to know if you have any of these conditions: Anemia not caused by low iron levels Heart disease High levels of iron in the blood Kidney disease Liver disease An unusual or allergic reaction to iron, other medications, foods, dyes, or preservatives Pregnant or trying to get pregnant Breastfeeding How should I use this medication? This medication is for infusion into a vein. It is given in a hospital or clinic setting. Talk to your care team about the use of this medication in children. While this medication may be prescribed for children as young as 2 years for selected conditions, precautions do apply. Overdosage: If you think you have taken too much of this medicine contact a poison control center or emergency room at once. NOTE: This medicine is only for you. Do not share this medicine with others. What if I miss a dose? Keep appointments for follow-up doses. It is important not to miss your dose. Call your care team if you are unable to keep an appointment. What may interact with this medication? Do not take this medication with any of the following: Deferoxamine Dimercaprol Other iron products This medication may also interact with the following: Chloramphenicol Deferasirox This list may not describe all possible interactions. Give your health care provider a list of all the medicines, herbs, non-prescription drugs, or dietary supplements you use. Also tell them if you smoke, drink alcohol, or use illegal drugs. Some items may interact with your medicine. What should I watch for while using this medication? Visit your care team regularly. Tell your care team if your symptoms do not start to get better or if they get worse. You may need blood work done while you are taking this medication. You may need to follow a special diet. Talk to your care team. Foods that  contain iron include: whole grains/cereals, dried fruits, beans, or peas, leafy green vegetables, and organ meats (liver, kidney). What side effects may I notice from receiving this medication? Side effects that you should report to your care team as soon as possible: Allergic reactions--skin rash, itching, hives, swelling of the face, lips, tongue, or throat Low blood pressure--dizziness, feeling faint or lightheaded, blurry vision Shortness of breath Side effects that usually do not require medical attention (report to your care team if they continue or are bothersome): Flushing Headache Joint pain Muscle pain Nausea Pain, redness, or irritation at injection site This list may not describe all possible side effects. Call your doctor for medical advice about side effects. You may report side effects to FDA at 1-800-FDA-1088. Where should I keep my medication? This medication is given in a hospital or clinic and will not be stored at home. NOTE: This sheet is a summary. It may not cover all possible information. If you have questions about this medicine, talk to your doctor, pharmacist, or health care provider.  2023 Elsevier/Gold Standard (2020-04-06 00:00:00)

## 2022-03-28 MED FILL — Iron Sucrose Inj 20 MG/ML (Fe Equiv): INTRAVENOUS | Qty: 10 | Status: AC

## 2022-03-29 ENCOUNTER — Inpatient Hospital Stay: Payer: 59

## 2022-04-01 MED FILL — Iron Sucrose Inj 20 MG/ML (Fe Equiv): INTRAVENOUS | Qty: 10 | Status: AC

## 2022-04-02 ENCOUNTER — Inpatient Hospital Stay: Payer: 59

## 2022-04-05 ENCOUNTER — Inpatient Hospital Stay: Payer: 59

## 2022-04-05 ENCOUNTER — Ambulatory Visit: Payer: 59

## 2022-04-05 VITALS — BP 127/73 | HR 57 | Temp 97.5°F | Resp 16

## 2022-04-05 DIAGNOSIS — D509 Iron deficiency anemia, unspecified: Secondary | ICD-10-CM | POA: Diagnosis not present

## 2022-04-05 MED ORDER — SODIUM CHLORIDE 0.9 % IV SOLN
200.0000 mg | Freq: Once | INTRAVENOUS | Status: AC
  Administered 2022-04-05: 200 mg via INTRAVENOUS
  Filled 2022-04-05: qty 200

## 2022-04-05 MED ORDER — CYANOCOBALAMIN 1000 MCG/ML IJ SOLN
1000.0000 ug | Freq: Once | INTRAMUSCULAR | Status: AC
  Administered 2022-04-05: 1000 ug via INTRAMUSCULAR
  Filled 2022-04-05: qty 1

## 2022-04-05 MED ORDER — SODIUM CHLORIDE 0.9 % IV SOLN
Freq: Once | INTRAVENOUS | Status: AC
  Filled 2022-04-05: qty 250

## 2022-04-05 NOTE — Patient Instructions (Signed)

## 2022-04-09 ENCOUNTER — Institutional Professional Consult (permissible substitution): Payer: 59 | Admitting: Pulmonary Disease

## 2022-04-10 ENCOUNTER — Telehealth: Payer: Self-pay | Admitting: *Deleted

## 2022-04-10 NOTE — Telephone Encounter (Signed)
Patient called reporting that she has had 2 iron injection and thinks she may be having an adverse raction to it. She reports that she started having diarrhea Saturday, she is having joint pains since Monday, she has had fevers off and on up to 101 Sat - Monday, but none since then, She states that she cannot walk more than a few feet without having to stop to catch her breath, but does admit that she has emphysema. She states that she just feels bad in general since her last infusion and that she felt great after her first one. Please advise.

## 2022-04-10 NOTE — Telephone Encounter (Signed)
Patient called stating she spoke with her PCP who said that allergies has aggravated her emphysema. Patient wants to continue with her iron treatment as is

## 2022-04-11 ENCOUNTER — Encounter: Payer: Self-pay | Admitting: Internal Medicine

## 2022-04-11 MED FILL — Iron Sucrose Inj 20 MG/ML (Fe Equiv): INTRAVENOUS | Qty: 10 | Status: AC

## 2022-04-11 NOTE — Telephone Encounter (Signed)
I called and spoke with patient and she confirmed her appointment for Friday 04/12/22 at 11 AM

## 2022-04-12 ENCOUNTER — Inpatient Hospital Stay: Payer: 59

## 2022-04-12 ENCOUNTER — Ambulatory Visit: Payer: 59

## 2022-04-19 ENCOUNTER — Inpatient Hospital Stay: Payer: 59 | Attending: Internal Medicine

## 2022-04-19 ENCOUNTER — Other Ambulatory Visit: Payer: Self-pay | Admitting: Family Medicine

## 2022-04-19 ENCOUNTER — Ambulatory Visit: Payer: 59

## 2022-04-19 VITALS — BP 134/77 | HR 57 | Temp 97.0°F | Resp 16

## 2022-04-19 DIAGNOSIS — R410 Disorientation, unspecified: Secondary | ICD-10-CM

## 2022-04-19 DIAGNOSIS — Z79899 Other long term (current) drug therapy: Secondary | ICD-10-CM | POA: Insufficient documentation

## 2022-04-19 DIAGNOSIS — D509 Iron deficiency anemia, unspecified: Secondary | ICD-10-CM | POA: Diagnosis not present

## 2022-04-19 DIAGNOSIS — E538 Deficiency of other specified B group vitamins: Secondary | ICD-10-CM | POA: Diagnosis not present

## 2022-04-19 MED ORDER — SODIUM CHLORIDE 0.9 % IV SOLN
200.0000 mg | Freq: Once | INTRAVENOUS | Status: AC
  Administered 2022-04-19: 200 mg via INTRAVENOUS
  Filled 2022-04-19: qty 200

## 2022-04-19 MED ORDER — SODIUM CHLORIDE 0.9 % IV SOLN
Freq: Once | INTRAVENOUS | Status: AC
  Filled 2022-04-19: qty 250

## 2022-04-19 MED ORDER — CYANOCOBALAMIN 1000 MCG/ML IJ SOLN
1000.0000 ug | Freq: Once | INTRAMUSCULAR | Status: AC
  Administered 2022-04-19: 1000 ug via INTRAMUSCULAR
  Filled 2022-04-19: qty 1

## 2022-04-19 NOTE — Patient Instructions (Signed)

## 2022-04-26 ENCOUNTER — Inpatient Hospital Stay: Payer: 59

## 2022-04-26 VITALS — BP 118/89 | HR 59 | Temp 98.2°F | Resp 16

## 2022-04-26 DIAGNOSIS — D509 Iron deficiency anemia, unspecified: Secondary | ICD-10-CM

## 2022-04-26 MED ORDER — SODIUM CHLORIDE 0.9 % IV SOLN
Freq: Once | INTRAVENOUS | Status: AC
  Filled 2022-04-26: qty 250

## 2022-04-26 MED ORDER — SODIUM CHLORIDE 0.9 % IV SOLN
200.0000 mg | Freq: Once | INTRAVENOUS | Status: AC
  Administered 2022-04-26: 200 mg via INTRAVENOUS
  Filled 2022-04-26: qty 200

## 2022-04-26 MED ORDER — CYANOCOBALAMIN 1000 MCG/ML IJ SOLN
1000.0000 ug | Freq: Once | INTRAMUSCULAR | Status: AC
  Administered 2022-04-26: 1000 ug via INTRAMUSCULAR
  Filled 2022-04-26: qty 1

## 2022-04-26 NOTE — Progress Notes (Signed)
Patient declined to wait the 30 minutes for post iron infusion observation today.  Post iron education done. Patient verbalized understanding. 

## 2022-05-02 MED FILL — Iron Sucrose Inj 20 MG/ML (Fe Equiv): INTRAVENOUS | Qty: 10 | Status: AC

## 2022-05-03 ENCOUNTER — Inpatient Hospital Stay: Payer: 59

## 2022-05-03 ENCOUNTER — Ambulatory Visit: Payer: 59

## 2022-05-06 ENCOUNTER — Institutional Professional Consult (permissible substitution): Payer: 59 | Admitting: Pulmonary Disease

## 2022-05-09 ENCOUNTER — Ambulatory Visit: Admission: RE | Admit: 2022-05-09 | Payer: 59 | Source: Ambulatory Visit

## 2022-05-09 ENCOUNTER — Inpatient Hospital Stay: Payer: 59

## 2022-05-12 ENCOUNTER — Ambulatory Visit
Admission: RE | Admit: 2022-05-12 | Discharge: 2022-05-12 | Disposition: A | Discharge status: Home / Self Care | Payer: 59 | Source: Ambulatory Visit | Attending: Family Medicine | Admitting: Family Medicine

## 2022-05-12 DIAGNOSIS — R410 Disorientation, unspecified: Secondary | ICD-10-CM | POA: Insufficient documentation

## 2022-05-16 ENCOUNTER — Inpatient Hospital Stay: Payer: 59 | Attending: Internal Medicine

## 2022-05-16 DIAGNOSIS — D509 Iron deficiency anemia, unspecified: Secondary | ICD-10-CM | POA: Insufficient documentation

## 2022-05-16 DIAGNOSIS — Z79899 Other long term (current) drug therapy: Secondary | ICD-10-CM | POA: Insufficient documentation

## 2022-05-16 DIAGNOSIS — E538 Deficiency of other specified B group vitamins: Secondary | ICD-10-CM | POA: Insufficient documentation

## 2022-05-16 MED FILL — Iron Sucrose Inj 20 MG/ML (Fe Equiv): INTRAVENOUS | Qty: 10 | Status: AC

## 2022-05-29 ENCOUNTER — Other Ambulatory Visit: Payer: Self-pay

## 2022-05-29 MED ORDER — ISOSORBIDE MONONITRATE ER 30 MG PO TB24: 15.0000 mg | ORAL_TABLET | Freq: Every day | ORAL | 0 refills | Status: DC

## 2022-05-29 MED FILL — Iron Sucrose Inj 20 MG/ML (Fe Equiv): INTRAVENOUS | Qty: 10 | Status: AC

## 2022-05-29 NOTE — Telephone Encounter (Signed)
Requested Prescriptions   Signed Prescriptions Disp Refills   isosorbide mononitrate (IMDUR) 30 MG 24 hr tablet 45 tablet 0    Sig: Take 0.5 tablets (15 mg total) by mouth daily.    Authorizing Provider: Debbe Odea    Ordering User: Guerry Minors

## 2022-05-30 ENCOUNTER — Inpatient Hospital Stay: Payer: 59

## 2022-06-05 ENCOUNTER — Inpatient Hospital Stay: Payer: 59

## 2022-06-05 VITALS — BP 112/69 | HR 54 | Temp 96.0°F | Resp 18

## 2022-06-05 DIAGNOSIS — E538 Deficiency of other specified B group vitamins: Secondary | ICD-10-CM | POA: Diagnosis not present

## 2022-06-05 DIAGNOSIS — Z79899 Other long term (current) drug therapy: Secondary | ICD-10-CM | POA: Diagnosis not present

## 2022-06-05 DIAGNOSIS — D509 Iron deficiency anemia, unspecified: Secondary | ICD-10-CM | POA: Diagnosis present

## 2022-06-05 MED ORDER — SODIUM CHLORIDE 0.9 % IV SOLN
Freq: Once | INTRAVENOUS | Status: AC
  Filled 2022-06-05: qty 250

## 2022-06-05 MED ORDER — CYANOCOBALAMIN 1000 MCG/ML IJ SOLN
1000.0000 ug | Freq: Once | INTRAMUSCULAR | Status: AC
  Administered 2022-06-05: 1000 ug via INTRAMUSCULAR
  Filled 2022-06-05: qty 1

## 2022-06-05 MED ORDER — SODIUM CHLORIDE 0.9 % IV SOLN
200.0000 mg | Freq: Once | INTRAVENOUS | Status: AC
  Administered 2022-06-05: 200 mg via INTRAVENOUS
  Filled 2022-06-05: qty 200

## 2022-06-05 NOTE — Progress Notes (Signed)
Declined 30 minute post-observation. Vitals stable at discharge.  

## 2022-06-05 NOTE — Patient Instructions (Signed)

## 2022-06-12 ENCOUNTER — Institutional Professional Consult (permissible substitution): Payer: 59 | Admitting: Student in an Organized Health Care Education/Training Program

## 2022-06-12 ENCOUNTER — Other Ambulatory Visit: Payer: Self-pay | Admitting: Acute Care

## 2022-06-12 DIAGNOSIS — F17210 Nicotine dependence, cigarettes, uncomplicated: Secondary | ICD-10-CM

## 2022-06-12 DIAGNOSIS — Z122 Encounter for screening for malignant neoplasm of respiratory organs: Secondary | ICD-10-CM

## 2022-06-12 DIAGNOSIS — Z87891 Personal history of nicotine dependence: Secondary | ICD-10-CM

## 2022-06-17 ENCOUNTER — Encounter: Payer: Self-pay | Admitting: Student in an Organized Health Care Education/Training Program

## 2022-07-16 ENCOUNTER — Inpatient Hospital Stay: Payer: 59

## 2022-07-16 ENCOUNTER — Inpatient Hospital Stay: Payer: 59 | Attending: Internal Medicine

## 2022-07-16 ENCOUNTER — Inpatient Hospital Stay (HOSPITAL_BASED_OUTPATIENT_CLINIC_OR_DEPARTMENT_OTHER): Payer: 59 | Admitting: Internal Medicine

## 2022-07-16 VITALS — BP 110/73 | HR 80 | Temp 98.7°F | Wt 107.4 lb

## 2022-07-16 DIAGNOSIS — E538 Deficiency of other specified B group vitamins: Secondary | ICD-10-CM | POA: Insufficient documentation

## 2022-07-16 DIAGNOSIS — D509 Iron deficiency anemia, unspecified: Secondary | ICD-10-CM

## 2022-07-16 DIAGNOSIS — F17210 Nicotine dependence, cigarettes, uncomplicated: Secondary | ICD-10-CM | POA: Insufficient documentation

## 2022-07-16 LAB — IRON AND TIBC
Iron: 96 ug/dL (ref 28–170)
Saturation Ratios: 32 % — ABNORMAL HIGH (ref 10.4–31.8)
TIBC: 304 ug/dL (ref 250–450)
UIBC: 208 ug/dL

## 2022-07-16 LAB — CBC WITH DIFFERENTIAL/PLATELET
Abs Immature Granulocytes: 0.01 10*3/uL (ref 0.00–0.07)
Basophils Absolute: 0.1 10*3/uL (ref 0.0–0.1)
Basophils Relative: 1 %
Eosinophils Absolute: 0.1 10*3/uL (ref 0.0–0.5)
Eosinophils Relative: 2 %
HCT: 42.5 % (ref 36.0–46.0)
Hemoglobin: 13.7 g/dL (ref 12.0–15.0)
Immature Granulocytes: 0 %
Lymphocytes Relative: 37 %
Lymphs Abs: 2.2 10*3/uL (ref 0.7–4.0)
MCH: 27.5 pg (ref 26.0–34.0)
MCHC: 32.2 g/dL (ref 30.0–36.0)
MCV: 85.3 fL (ref 80.0–100.0)
Monocytes Absolute: 0.4 10*3/uL (ref 0.1–1.0)
Monocytes Relative: 7 %
Neutro Abs: 3.1 10*3/uL (ref 1.7–7.7)
Neutrophils Relative %: 53 %
Platelets: 144 10*3/uL — ABNORMAL LOW (ref 150–400)
RBC: 4.98 MIL/uL (ref 3.87–5.11)
RDW: 19.3 % — ABNORMAL HIGH (ref 11.5–15.5)
WBC: 5.9 10*3/uL (ref 4.0–10.5)
nRBC: 0 % (ref 0.0–0.2)

## 2022-07-16 LAB — FERRITIN: Ferritin: 59 ng/mL (ref 11–307)

## 2022-07-16 MED FILL — Iron Sucrose Inj 20 MG/ML (Fe Equiv): INTRAVENOUS | Qty: 10 | Status: AC

## 2022-07-16 NOTE — Progress Notes (Signed)
Patient has multiple falls over the past few months, which is due to her losing her balance. She says that since coming off some of the medications that she does feel better.

## 2022-07-16 NOTE — Progress Notes (Signed)
[facility]  [phone] [phone]  ID: Tammy Maxwell OB: 1960/08/03  MR#: [id number]  AOZ#:[id number]  Patient Care Team: Marliss Coots, MD as PCP - General (Family Medicine) Debbe Odea, MD as PCP - Cardiology (Cardiology)   HPI: Tammy Maxwell is a 62 y.o. female with past medical history of COPD, GERD, depression, anemia, arthritis was referred to hematology for management of iron deficiency anemia.  Patient has iron deficiency anemia at least since April 2023.  Her ferritin at that time was 3.  She was started on oral iron but could not tolerate due to upset stomach.  Her last colonoscopy and endoscopy was in 2022 which was done for constipation and dysphagia. Endoscopy showed diffuse white plaques in the upper third of the esophagus.  Otherwise was normal.  Pathology showed benign squamous mucosa.  KOH prep was positive for yeast infection.  Colonoscopy was suboptimal due to poor preparation.  Blood work from January 2024 - hemoglobin 9.7, ferritin 5, B12 184.  She feels fatigued.  Denies any melanotic stools or blood in urine.  She is taking ibuprofen 3-4 times a day for past 1 year due to diffuse pain in her joints and body.  She is unable to tolerate Tylenol.  Gives her headache.  Completed IV Venofer 200 mg x 5 doses in May 2024. Also received IM B12 injections x 5  Interval history Patient is seen today as follow-up for iron deficiency anemia and labs. Patient reports she felt very well after the first infusion but not so much after that.  She has weaned off NSAIDs. Continues to smoke.  Shortness of breath on exertion from her COPD.  Has follow-up with pulmonary next week. Reports intolerance to B12 pills also.  REVIEW OF SYSTEMS:   ROS  As per HPI. Otherwise, a complete review of systems is negative.  PAST MEDICAL HISTORY: Past Medical History:  Diagnosis Date   Anemia    Arthritis    Bronchitis    Chest pain    COPD  (chronic obstructive pulmonary disease) (HCC)    Depression    Dysrhythmia    GERD (gastroesophageal reflux disease)    Headache    Heart murmur    History of hiatal hernia    Pneumonia    Pneumothorax    Wears dentures    full upper    PAST SURGICAL HISTORY: Past Surgical History:  Procedure Laterality Date   APPENDECTOMY     CATARACT EXTRACTION W/PHACO Right 08/19/2019   Procedure: CATARACT EXTRACTION PHACO AND INTRAOCULAR LENS PLACEMENT (IOC) RIGHT;  Surgeon: Nevada Crane, MD;  Location: [facility] ORS;  Service: Ophthalmology;  Laterality: Right;  [place] 00:14.5 CDE 0.90 Fluid Pack lot # [id number]   CATARACT EXTRACTION W/PHACO Left 09/20/2019   Procedure: CATARACT EXTRACTION PHACO AND INTRAOCULAR LENS PLACEMENT (IOC) LEFT 1.27  00:13.7;  Surgeon: Nevada Crane, MD;  Location: [facility];  Service: Ophthalmology;  Laterality: Left;   COLONOSCOPY     COLONOSCOPY WITH PROPOFOL N/A 02/15/2020   Procedure: COLONOSCOPY WITH PROPOFOL;  Surgeon: Wyline Mood, MD;  Location: [facility];  Service: Gastroenterology;  Laterality: N/A;   ESOPHAGOGASTRODUODENOSCOPY (EGD) WITH PROPOFOL N/A 02/15/2020   Procedure: ESOPHAGOGASTRODUODENOSCOPY (EGD) WITH PROPOFOL;  Surgeon: Wyline Mood, MD;  Location: [facility] END[facility]COPY;  Service: Gastroenterology;  Laterality: N/A;   LUNG LOBECTOMY Left    OOPHORECTOMY     ROTATOR CUFF REPAIR     SHOULDER ARTHROSCOPY Right     FAMILY HISTORY: Family History  Problem Relation Age of Onset   Stroke Mother    Hypertension Mother    Diabetes Mother    Clotting disorder Mother    Cancer Mother    Anesthesia problems Mother    Stroke Father    Hypertension Father    Diabetes Father    Cancer Father    Clotting disorder Father    Anesthesia problems Father    Hypertension Sister    Diabetes Sister    Hypertension Brother    Parkinson's disease Maternal Grandfather     HEALTH MAINTENANCE: Social History   Tobacco Use   Smoking status: Every Day     Packs/day: 0.50    Years: 47.00    Additional pack years: 0.00    Total pack years: 23.50    Types: Cigarettes   Smokeless tobacco: Never   Tobacco comments:    Recently cut back to 0.5 PPD  Vaping Use   Vaping Use: Never used  Substance Use Topics   Alcohol use: Not Currently   Drug use: Not Currently     Allergies  Allergen Reactions   Codeine Anaphylaxis   Penicillins Anaphylaxis    Current Outpatient Medications  Medication Sig Dispense Refill   albuterol (PROVENTIL) (2.5 MG/3ML) 0.083% nebulizer solution Take 2.5 mg by nebulization 4 (four) times daily.     aspirin 81 MG chewable tablet Chew 81 mg by mouth daily.     budesonide-formoterol (SYMBICORT) 160-4.5 MCG/ACT inhaler Inhale 2 puffs into the lungs 2 (two) times daily.     buPROPion (WELLBUTRIN SR) 150 MG 12 hr tablet Take 150 mg by mouth 2 (two) times daily.     isosorbide mononitrate (IMDUR) 30 MG 24 hr tablet Take 0.5 tablets (15 mg total) by mouth daily. 45 tablet 0   levothyroxine (SYNTHROID) 50 MCG tablet Take 50 mcg by mouth daily.     meloxicam (MOBIC) 15 MG tablet Take 15 mg by mouth at bedtime.     naloxone (NARCAN) nasal spray 4 mg/0.1 mL SMARTSIG:Both Nares     nicotine (NICODERM CQ - DOSED IN MG/24 HOURS) 14 mg/24hr patch Place 14 mg onto the skin daily.     nitrofurantoin, macrocrystal-monohydrate, (MACROBID) 100 MG capsule Take 100 mg by mouth 2 (two) times daily.     nitroGLYCERIN (NITROSTAT) 0.3 MG SL tablet Place 0.3 mg under the tongue every 5 (five) minutes as needed for chest pain.     omeprazole (PRILOSEC) 20 MG capsule Take 20 mg by mouth 2 (two) times daily before a meal.     PARoxetine (PAXIL) 20 MG tablet Take 20 mg by mouth at bedtime.     pravastatin (PRAVACHOL) 10 MG tablet Take 10 mg by mouth daily.     QUEtiapine (SEROQUEL) 400 MG tablet Take 400 mg by mouth at bedtime.     sucralfate (CARAFATE) 1 GM/10ML suspension Take 2 g by mouth 3 times/day as needed-between meals & bedtime.      SUMAtriptan (IMITREX) 100 MG tablet Take 100 mg by mouth every 2 (two) hours as needed for migraine. May repeat in 2 hours if headache persists or recurs.     traMADol (ULTRAM) 50 MG tablet Take 50 mg by mouth 4 (four) times daily.     Vitamin D, Ergocalciferol, (DRISDOL) 1.25 MG (50000 UNIT) CAPS capsule Take 50,000 Units by mouth once a week.     carisoprodol (SOMA) 350 MG tablet Take 350 mg by mouth 3 (three) times daily as needed for muscle spasms. (Patient not  taking: Reported on 07/16/2022)     cholecalciferol (VITAMIN D3) 25 MCG (1000 UNIT) tablet Take 1,000 Units by mouth daily.     clonazePAM (KLONOPIN) 1 MG tablet Take 1 mg by mouth at bedtime.     cyanocobalamin (VITAMIN B12) 500 MCG tablet Take 500 mcg by mouth daily. (Patient not taking: Reported on 07/16/2022)     ferrous sulfate 324 MG TBEC Take 324 mg by mouth. (Patient not taking: Reported on 07/16/2022)     IPRATROPIUM BROMIDE HFA IN Inhale 2 puffs into the lungs 4 (four) times daily. (Patient not taking: Reported on 07/16/2022)     rosuvastatin (CRESTOR) 10 MG tablet Take 10 mg by mouth daily. (Patient not taking: Reported on 07/16/2022)     No current facility-administered medications for this visit.    OBJECTIVE: There were no vitals filed for this visit.    There is no height or weight on file to calculate BMI.      General: Well-developed, well-nourished, no acute distress. Eyes: Pink conjunctiva, anicteric sclera. HEENT: Normocephalic, moist mucous membranes, clear oropharnyx. Lungs: Clear to auscultation bilaterally. Heart: Regular rate and rhythm. No rubs, murmurs, or gallops. Abdomen: Soft, nontender, nondistended. No organomegaly noted, normoactive bowel sounds. Musculoskeletal: No edema, cyanosis, or clubbing. Neuro: Alert, answering all questions appropriately. Cranial nerves grossly intact. Skin: No rashes or petechiae noted. Psych: Normal affect. Lymphatics: No cervical, calvicular, axillary or inguinal  LAD.   LAB RESULTS:  Lab Results  Component Value Date   NA 137 02/13/2022   K 4.6 02/13/2022   CL 102 02/13/2022   CO2 26 02/13/2022   GLUCOSE 89 02/13/2022   BUN 13 02/13/2022   CREATININE 1.29 (H) 02/13/2022   CALCIUM 9.3 02/13/2022   PROT 6.9 04/12/2021   ALBUMIN 3.6 04/12/2021   AST 20 04/12/2021   ALT 9 04/12/2021   ALKPHOS 120 04/12/2021   BILITOT 0.6 04/12/2021   GFRNONAA 48 (L) 02/13/2022   GFRAA >60 10/06/2017    Lab Results  Component Value Date   WBC 5.9 07/16/2022   NEUTROABS 3.1 07/16/2022   HGB 13.7 07/16/2022   HCT 42.5 07/16/2022   MCV 85.3 07/16/2022   PLT 144 (L) 07/16/2022    Lab Results  Component Value Date   FERRITIN 3 (L) 04/12/2021     STUDIES: No results found.  ASSESSMENT AND PLAN:   Tammy Maxwell is a 62 y.o. female with pmh of COPD, GERD, depression, anemia, arthritis was referred to hematology for management of iron deficiency anemia.  # Iron deficiency anemia -Could not tolerate oral iron due to upset stomach. -Labs from January 2024 hemoglobin 9.7 and ferritin 5.  Completed IV Venofer x 5 doses in May 2024.  Hemoglobin has normalized to 13.7 today.  Iron level has normalized.  No further iron infusions indicated. -Scheduled to see Dr. Tobi Bastos on July 23 to discuss about repeat colonoscopy.  # B12 deficiency -Recent B12 level 184.  Will proceed with IM 1000 mcg injection weekly x 4  -Repeat B12 level is pending.  Patient reports that she stopped B12 supplements as she could not tolerate.  If B12 level continues to be low, will start her on maintenance B12 injection.  Her son feels comfortable injecting at home and would prefer that.  Orders Placed This Encounter  Procedures   Ferritin   CBC with Differential (Cancer Center Only)   Iron and TIBC([facility])   Vitamin B12     RTC in 6 months for MD visit, labs.  Patient expressed understanding and was in agreement with this plan. She also understands that She can call  clinic at any time with any questions, concerns, or complaints.   I spent a total of 30 minutes reviewing chart data, face-to-face evaluation with the patient, counseling and coordination of care as detailed above.  Michaelyn Barter, MD   07/16/2022 1:11 PM

## 2022-07-17 LAB — VITAMIN B12: Vitamin B-12: 265 pg/mL (ref 180–914)

## 2022-07-22 ENCOUNTER — Telehealth: Payer: Self-pay | Admitting: Internal Medicine

## 2022-07-22 NOTE — Telephone Encounter (Signed)
I called Tammy Maxwell back after she left me a message requesting to send her labs to another doctor. Her mailbox was full and I could not leave her a message.

## 2022-07-23 ENCOUNTER — Encounter: Payer: Self-pay | Admitting: Student in an Organized Health Care Education/Training Program

## 2022-07-23 ENCOUNTER — Ambulatory Visit: Payer: 59 | Admitting: Student in an Organized Health Care Education/Training Program

## 2022-07-23 VITALS — BP 120/68 | HR 78 | Temp 97.9°F | Ht 68.5 in | Wt 109.2 lb

## 2022-07-23 DIAGNOSIS — Z87891 Personal history of nicotine dependence: Secondary | ICD-10-CM | POA: Diagnosis not present

## 2022-07-23 DIAGNOSIS — J432 Centrilobular emphysema: Secondary | ICD-10-CM | POA: Diagnosis not present

## 2022-07-23 DIAGNOSIS — R0602 Shortness of breath: Secondary | ICD-10-CM | POA: Diagnosis not present

## 2022-07-23 MED ORDER — SPIRIVA RESPIMAT 2.5 MCG/ACT IN AERS
2.0000 | INHALATION_SPRAY | Freq: Every day | RESPIRATORY_TRACT | 11 refills | Status: AC
Start: 2022-07-23 — End: 2023-08-14

## 2022-07-23 NOTE — Progress Notes (Signed)
Synopsis: Referred in for shortness of breath by Debbe Odea, MD  Assessment & Plan:   1. Shortness of breath 2. Centrilobular emphysema (HCC) 3. Personal history of tobacco use, presenting hazards to health  Presents for the evaluation of shortness of breath in the setting of known emphysema secondary to long standing cigarette smoking. Patient has been working on cutting down and aims to fully stop smoking soon (currently smoking 2 cigarettes a day). She is using Symbicort and Ipratropium which I will optimize by switching ipratropium to long acting tiotropium (spiriva respimat) for full ICS/LABA/LAMA combination. I will obtain a pulmonary function test and refer the patient to pulmonary rehabilitation for management of her symptoms.  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 I reviewed her images and it appears she might have had a subtotal lobar resection (? Segmentectomty vs wedge resection). The very mild subpleural reticulation has been stable (appears worse on the coronary CT secondary to respiratory motion) and I will follow up on radiographic changes with her upcoming LDCT for lung cancer screening.  - Tiotropium Bromide Monohydrate (SPIRIVA RESPIMAT) 2.5 MCG/ACT AERS; Inhale 2 puffs into the lungs daily.  Dispense: 4 g; Refill: 11 - AMB referral to pulmonary rehabilitation - Pulmonary Function Test [facility] Only; Future   Return in about 3 months (around 10/23/2022).  I spent 45 minutes caring for this patient today, including preparing to see the patient, obtaining a medical history , reviewing a separately obtained history, performing a medically appropriate examination and/or evaluation, counseling and educating the patient/family/caregiver, ordering medications, tests, or procedures, documenting clinical information in the electronic health record, and independently interpreting results (not separately reported/billed) and  communicating results to the patient/family/caregiver  Raechel Chute, MD [facility] 07/23/2022 9:52 AM    End of visit medications:  Meds ordered this encounter  Medications   Tiotropium Bromide Monohydrate (SPIRIVA RESPIMAT) 2.5 MCG/ACT AERS    Sig: Inhale 2 puffs into the lungs daily.    Dispense:  4 g    Refill:  11     Current Outpatient Medications:    albuterol (PROVENTIL) (2.5 MG/3ML) 0.083% nebulizer solution, Take 2.5 mg by nebulization 4 (four) times daily., Disp: , Rfl:    aspirin 81 MG chewable tablet, Chew 81 mg by mouth daily., Disp: , Rfl:    budesonide-formoterol (SYMBICORT) 160-4.5 MCG/ACT inhaler, Inhale 2 puffs into the lungs 2 (two) times daily., Disp: , Rfl:    buPROPion (WELLBUTRIN SR) 150 MG 12 hr tablet, Take 150 mg by mouth 2 (two) times daily., Disp: , Rfl:    cholecalciferol (VITAMIN D3) 25 MCG (1000 UNIT) tablet, Take 1,000 Units by mouth daily., Disp: , Rfl:    clonazePAM (KLONOPIN) 1 MG tablet, Take 1 mg by mouth at bedtime., Disp: , Rfl:    cyanocobalamin (VITAMIN B12) 500 MCG tablet, Take 500 mcg by mouth daily., Disp: , Rfl:    ferrous sulfate 324 MG TBEC, Take 324 mg by mouth., Disp: , Rfl:    IPRATROPIUM BROMIDE HFA IN, Inhale 2 puffs into the lungs 4 (four) times daily., Disp: , Rfl:    isosorbide mononitrate (IMDUR) 30 MG 24 hr tablet, Take 0.5 tablets (15 mg total) by mouth daily., Disp: 45 tablet, Rfl: 0   levothyroxine (SYNTHROID) 50 MCG tablet, Take 50 mcg by mouth daily., Disp: , Rfl:    meloxicam (MOBIC) 15 MG tablet, Take 15 mg by mouth at bedtime., Disp: , Rfl:  naloxone (NARCAN) nasal spray 4 mg/0.1 mL, SMARTSIG:Both Nares, Disp: , Rfl:    nicotine (NICODERM CQ - DOSED IN MG/24 HOURS) 14 mg/24hr patch, Place 14 mg onto the skin daily., Disp: , Rfl:    nitrofurantoin, macrocrystal-monohydrate, (MACROBID) 100 MG capsule, Take 100 mg by mouth 2 (two) times daily., Disp: , Rfl:    nitroGLYCERIN (NITROSTAT) 0.3 MG SL  tablet, Place 0.3 mg under the tongue every 5 (five) minutes as needed for chest pain., Disp: , Rfl:    omeprazole (PRILOSEC) 20 MG capsule, Take 20 mg by mouth 2 (two) times daily before a meal., Disp: , Rfl:    PARoxetine (PAXIL) 20 MG tablet, Take 20 mg by mouth at bedtime., Disp: , Rfl:    pravastatin (PRAVACHOL) 10 MG tablet, Take 10 mg by mouth daily., Disp: , Rfl:    QUEtiapine (SEROQUEL) 400 MG tablet, Take 400 mg by mouth at bedtime., Disp: , Rfl:    sucralfate (CARAFATE) 1 GM/10ML suspension, Take 2 g by mouth 3 times/day as needed-between meals & bedtime., Disp: , Rfl:    SUMAtriptan (IMITREX) 100 MG tablet, Take 100 mg by mouth every 2 (two) hours as needed for migraine. May repeat in 2 hours if headache persists or recurs., Disp: , Rfl:    Tiotropium Bromide Monohydrate (SPIRIVA RESPIMAT) 2.5 MCG/ACT AERS, Inhale 2 puffs into the lungs daily., Disp: 4 g, Rfl: 11   traMADol (ULTRAM) 50 MG tablet, Take 50 mg by mouth 4 (four) times daily., Disp: , Rfl:    carisoprodol (SOMA) 350 MG tablet, Take 350 mg by mouth 3 (three) times daily as needed for muscle spasms. (Patient not taking: Reported on 07/23/2022), Disp: , Rfl:    rosuvastatin (CRESTOR) 10 MG tablet, Take 10 mg by mouth daily. (Patient not taking: Reported on 07/23/2022), Disp: , Rfl:    Vitamin D, Ergocalciferol, (DRISDOL) 1.25 MG (50000 UNIT) CAPS capsule, Take 50,000 Units by mouth once a week. (Patient not taking: Reported on 07/23/2022), Disp: , Rfl:    Subjective:   PATIENT ID: [facility] GENDER: female DOB: 1960/11/12, MRN: [id number]  Chief Complaint  Patient presents with   pulmonary consult    SOB with exertion, prod cough with clear sputum and wheezing.     HPI  Patient is a pleasant 62 year old female presenting for the evaluation of shortness of breath.  She reports symptoms that have been present since 1995. She reports having had surgery in 1995 where "a part of my lung was removed". She reports this was  done because of recurrent collapse of the lung. She reports progressive dyspnea over the years, and was told she had COPD in the past. Patient's symptoms include a cough that is productive of brownish sputum at times, dyspnea with exertion (especially going up stairs), and wheezing. She doesn't report any recent COPD exacerbations requiring steroids.  Patient has been seen by cardiology for chest tightness and shortness of breath, and workup has included a coronary CTA and echocardiogram. She is enrolled in our lung cancer screening program with her last CT being from July of 2023. She is scheduled for an upcoming CT later this month.  Patient previously lived in [place], [place]. She used to work as a Health visitor (plumbing, Lobbyist, Event organiser). Denies asbestos exposure. She used to be a heavy smoker and has since cut down to 2 cigarettes a day.  Ancillary information including prior medications, full medical/surgical/family/social histories, and PFTs (when available) are listed below and have been reviewed.  Review of Systems  Constitutional:  Positive for malaise/fatigue and weight loss. Negative for chills and fever.  Respiratory:  Positive for cough, sputum production, shortness of breath and wheezing. Negative for hemoptysis.   Cardiovascular:  Negative for chest pain and palpitations.     Objective:   Vitals:   07/23/22 0932  BP: 120/68  Pulse: 78  Temp: 97.9 F (36.6 C)  TempSrc: Temporal  SpO2: 95%  Weight: 109 lb 3.2 oz (49.5 kg)  Height: 5' 8.5" (1.74 m)   95% on RA BMI Readings from Last 3 Encounters:  07/23/22 16.36 kg/m  07/16/22 16.09 kg/m  03/18/22 17.83 kg/m   Wt Readings from Last 3 Encounters:  07/23/22 109 lb 3.2 oz (49.5 kg)  07/16/22 107 lb 6.4 oz (48.7 kg)  03/18/22 119 lb (54 kg)    Physical Exam Constitutional:      Appearance: Normal appearance. She is not ill-appearing.  Cardiovascular:     Rate and Rhythm: Normal rate and regular rhythm.      Pulses: Normal pulses.     Heart sounds: Normal heart sounds.  Pulmonary:     Breath sounds: Wheezing (scattered wheeze) present.     Comments: Decreased air entry in the bilateral lung field Abdominal:     Palpations: Abdomen is soft.  Neurological:     General: No focal deficit present.     Mental Status: She is alert and oriented to person, place, and time. Mental status is at baseline.       Ancillary Information    Past Medical History:  Diagnosis Date   Anemia    Arthritis    Bronchitis    Chest pain    COPD (chronic obstructive pulmonary disease) (HCC)    Depression    Dysrhythmia    GERD (gastroesophageal reflux disease)    Headache    Heart murmur    History of hiatal hernia    Pneumonia    Pneumothorax    Wears dentures    full upper     Family History  Problem Relation Age of Onset   Stroke Mother    Hypertension Mother    Diabetes Mother    Clotting disorder Mother    Cancer Mother    Anesthesia problems Mother    Stroke Father    Hypertension Father    Diabetes Father    Cancer Father    Clotting disorder Father    Anesthesia problems Father    Hypertension Sister    Diabetes Sister    Hypertension Brother    Parkinson's disease Maternal Grandfather      Past Surgical History:  Procedure Laterality Date   APPENDECTOMY     CATARACT EXTRACTION W/PHACO Right 08/19/2019   Procedure: CATARACT EXTRACTION PHACO AND INTRAOCULAR LENS PLACEMENT (IOC) RIGHT;  Surgeon: Nevada Crane, MD;  Location: [facility] ORS;  Service: Ophthalmology;  Laterality: Right;  [place] 00:14.5 CDE 0.90 Fluid Pack lot # [id number]   CATARACT EXTRACTION W/PHACO Left 09/20/2019   Procedure: CATARACT EXTRACTION PHACO AND INTRAOCULAR LENS PLACEMENT (IOC) LEFT 1.27  00:13.7;  Surgeon: Nevada Crane, MD;  Location: [facility];  Service: Ophthalmology;  Laterality: Left;   COLONOSCOPY     COLONOSCOPY WITH PROPOFOL N/A 02/15/2020   Procedure: COLONOSCOPY WITH PROPOFOL;   Surgeon: Wyline Mood, MD;  Location: [facility];  Service: Gastroenterology;  Laterality: N/A;   ESOPHAGOGASTRODUODENOSCOPY (EGD) WITH PROPOFOL N/A 02/15/2020   Procedure: ESOPHAGOGASTRODUODENOSCOPY (EGD) WITH PROPOFOL;  Surgeon: Wyline Mood, MD;  Location:  [facility] ENDOSCOPY;  Service: Gastroenterology;  Laterality: N/A;   LUNG LOBECTOMY Left    OOPHORECTOMY     ROTATOR CUFF REPAIR     SHOULDER ARTHROSCOPY Right     Social History   Socioeconomic History   Marital status: Single    Spouse name: Not on file   Number of children: Not on file   Years of education: Not on file   Highest education level: Not on file  Occupational History   Not on file  Tobacco Use   Smoking status: Every Day    Current packs/day: 0.50    Average packs/day: 0.5 packs/day for 47.0 years (23.5 ttl pk-yrs)    Types: Cigarettes   Smokeless tobacco: Never   Tobacco comments:    2 cigs daily- 07/23/2022  Vaping Use   Vaping status: Never Used  Substance and Sexual Activity   Alcohol use: Not Currently   Drug use: Not Currently   Sexual activity: Not Currently  Other Topics Concern   Not on file  Social History Narrative   Not on file   Social Determinants of Health   Financial Resource Strain: Not on file  Food Insecurity: Food Insecurity Present (03/18/2022)   Hunger Vital Sign    Worried About Running Out of Food in the Last Year: Sometimes true    Ran Out of Food in the Last Year: Sometimes true  Transportation Needs: No Transportation Needs (03/18/2022)   PRAPARE - Administrator, Civil Service (Medical): No    Lack of Transportation (Non-Medical): No  Physical Activity: Not on file  Stress: Not on file  Social Connections: Unknown (03/18/2022)   Social Connection and Isolation Panel [NHANES]    Frequency of Communication with Friends and Family: Patient declined    Frequency of Social Gatherings with Friends and Family: Patient declined    Attends Religious Services: Patient  declined    Active Member of Clubs or Organizations: Not on file    Attends Banker Meetings: Patient declined    Marital Status: Patient declined  Intimate Partner Violence: Not At Risk (03/18/2022)   Humiliation, Afraid, Rape, and Kick questionnaire    Fear of Current or Ex-Partner: No    Emotionally Abused: No    Physically Abused: No    Sexually Abused: No     Allergies  Allergen Reactions   Codeine Anaphylaxis   Penicillins Anaphylaxis     CBC    Component Value Date/Time   WBC 5.9 07/16/2022 1248   RBC 4.98 07/16/2022 1248   HGB 13.7 07/16/2022 1248   HCT 42.5 07/16/2022 1248   PLT 144 (L) 07/16/2022 1248   MCV 85.3 07/16/2022 1248   MCH 27.5 07/16/2022 1248   MCHC 32.2 07/16/2022 1248   RDW 19.3 (H) 07/16/2022 1248   LYMPHSABS 2.2 07/16/2022 1248   MONOABS 0.4 07/16/2022 1248   EOSABS 0.1 07/16/2022 1248   BASOSABS 0.1 07/16/2022 1248    Pulmonary Functions Testing Results:     No data to display          Outpatient Medications Prior to Visit  Medication Sig Dispense Refill   albuterol (PROVENTIL) (2.5 MG/3ML) 0.083% nebulizer solution Take 2.5 mg by nebulization 4 (four) times daily.     aspirin 81 MG chewable tablet Chew 81 mg by mouth daily.     budesonide-formoterol (SYMBICORT) 160-4.5 MCG/ACT inhaler Inhale 2 puffs into the lungs 2 (two) times daily.     buPROPion (WELLBUTRIN SR) 150 MG 12  hr tablet Take 150 mg by mouth 2 (two) times daily.     cholecalciferol (VITAMIN D3) 25 MCG (1000 UNIT) tablet Take 1,000 Units by mouth daily.     clonazePAM (KLONOPIN) 1 MG tablet Take 1 mg by mouth at bedtime.     cyanocobalamin (VITAMIN B12) 500 MCG tablet Take 500 mcg by mouth daily.     ferrous sulfate 324 MG TBEC Take 324 mg by mouth.     IPRATROPIUM BROMIDE HFA IN Inhale 2 puffs into the lungs 4 (four) times daily.     isosorbide mononitrate (IMDUR) 30 MG 24 hr tablet Take 0.5 tablets (15 mg total) by mouth daily. 45 tablet 0   levothyroxine  (SYNTHROID) 50 MCG tablet Take 50 mcg by mouth daily.     meloxicam (MOBIC) 15 MG tablet Take 15 mg by mouth at bedtime.     naloxone (NARCAN) nasal spray 4 mg/0.1 mL SMARTSIG:Both Nares     nicotine (NICODERM CQ - DOSED IN MG/24 HOURS) 14 mg/24hr patch Place 14 mg onto the skin daily.     nitrofurantoin, macrocrystal-monohydrate, (MACROBID) 100 MG capsule Take 100 mg by mouth 2 (two) times daily.     nitroGLYCERIN (NITROSTAT) 0.3 MG SL tablet Place 0.3 mg under the tongue every 5 (five) minutes as needed for chest pain.     omeprazole (PRILOSEC) 20 MG capsule Take 20 mg by mouth 2 (two) times daily before a meal.     PARoxetine (PAXIL) 20 MG tablet Take 20 mg by mouth at bedtime.     pravastatin (PRAVACHOL) 10 MG tablet Take 10 mg by mouth daily.     QUEtiapine (SEROQUEL) 400 MG tablet Take 400 mg by mouth at bedtime.     sucralfate (CARAFATE) 1 GM/10ML suspension Take 2 g by mouth 3 times/day as needed-between meals & bedtime.     SUMAtriptan (IMITREX) 100 MG tablet Take 100 mg by mouth every 2 (two) hours as needed for migraine. May repeat in 2 hours if headache persists or recurs.     traMADol (ULTRAM) 50 MG tablet Take 50 mg by mouth 4 (four) times daily.     carisoprodol (SOMA) 350 MG tablet Take 350 mg by mouth 3 (three) times daily as needed for muscle spasms. (Patient not taking: Reported on 07/23/2022)     rosuvastatin (CRESTOR) 10 MG tablet Take 10 mg by mouth daily. (Patient not taking: Reported on 07/23/2022)     Vitamin D, Ergocalciferol, (DRISDOL) 1.25 MG (50000 UNIT) CAPS capsule Take 50,000 Units by mouth once a week. (Patient not taking: Reported on 07/23/2022)     No facility-administered medications prior to visit.

## 2022-07-30 ENCOUNTER — Other Ambulatory Visit: Payer: Self-pay

## 2022-07-30 ENCOUNTER — Ambulatory Visit: Payer: 59 | Admitting: Gastroenterology

## 2022-07-30 NOTE — Progress Notes (Deleted)
Wyline Mood MD, MRCP(U.K) [address]  [address]  [place], [place] [address]  Main: [phone]  Fax: [phone]   Primary Care Physician: Marliss Coots, MD  Primary Gastroenterologist:  Dr. Wyline Mood   No chief complaint on file.   HPI: Tammy Maxwell is a 62 y.o. female   Summary of history :  Seen back in 2022 for dysphagia. Ongoing her entire life . EGD was performed esophageal candida noted and treated. Colonoscopy poor prep and was advised to repeat which she didn't follow through.   Today referred for iron deficiency anemia   Interval history   2022-07/30/2022   She was seen by Dr. Alena Bills recently for iron deficiency anemia.  1 year back her hemoglobin was 9 g with an MCV of 73.3 with a ferritin of 3 in April 2023.  B12 checked 2 weeks back borderline low at 265.  She is receiving B12 supplementation as well as iron supplementation.  [place] in hematology and referred to [place] for iron deficiency anemia.  It appears she has been taking NSAIDs on a regular basis.  Could not tolerate oral iron.         Latest Ref Rng & Units 07/16/2022   12:48 PM 04/12/2021   10:03 AM 10/06/2017    4:37 PM  CBC  WBC 4.0 - 10.5 K/uL 5.9  6.3  11.9   Hemoglobin 12.0 - 15.0 g/dL 01.0  9.0  93.2   Hematocrit 36.0 - 46.0 % 42.5  30.0  31.6   Platelets 150 - 400 K/uL 144  257  283    Iron/TIBC/Ferritin/ %Sat    Component Value Date/Time   IRON 96 07/16/2022 1248   TIBC 304 07/16/2022 1248   FERRITIN 59 07/16/2022 1248   IRONPCTSAT 32 (H) 07/16/2022 1248     Current Outpatient Medications  Medication Sig Dispense Refill   albuterol (PROVENTIL) (2.5 MG/3ML) 0.083% nebulizer solution Take 2.5 mg by nebulization 4 (four) times daily.     aspirin 81 MG chewable tablet Chew 81 mg by mouth daily.     budesonide-formoterol (SYMBICORT) 160-4.5 MCG/ACT inhaler Inhale 2 puffs into the lungs 2 (two) times daily.     buPROPion (WELLBUTRIN SR) 150 MG 12 hr tablet Take 150 mg by mouth  2 (two) times daily.     carisoprodol (SOMA) 350 MG tablet Take 350 mg by mouth 3 (three) times daily as needed for muscle spasms. (Patient not taking: Reported on 07/23/2022)     cholecalciferol (VITAMIN D3) 25 MCG (1000 UNIT) tablet Take 1,000 Units by mouth daily.     clonazePAM (KLONOPIN) 1 MG tablet Take 1 mg by mouth at bedtime.     cyanocobalamin (VITAMIN B12) 500 MCG tablet Take 500 mcg by mouth daily.     ferrous sulfate 324 MG TBEC Take 324 mg by mouth.     IPRATROPIUM BROMIDE HFA IN Inhale 2 puffs into the lungs 4 (four) times daily.     isosorbide mononitrate (IMDUR) 30 MG 24 hr tablet Take 0.5 tablets (15 mg total) by mouth daily. 45 tablet 0   levothyroxine (SYNTHROID) 50 MCG tablet Take 50 mcg by mouth daily.     meloxicam (MOBIC) 15 MG tablet Take 15 mg by mouth at bedtime.     naloxone (NARCAN) nasal spray 4 mg/0.1 mL SMARTSIG:Both Nares     nicotine (NICODERM CQ - DOSED IN MG/24 HOURS) 14 mg/24hr patch Place 14 mg onto the skin daily.     nitrofurantoin,  macrocrystal-monohydrate, (MACROBID) 100 MG capsule Take 100 mg by mouth 2 (two) times daily.     nitroGLYCERIN (NITROSTAT) 0.3 MG SL tablet Place 0.3 mg under the tongue every 5 (five) minutes as needed for chest pain.     omeprazole (PRILOSEC) 40 MG capsule Take 40 mg by mouth 2 (two) times daily.     PARoxetine (PAXIL) 20 MG tablet Take 20 mg by mouth at bedtime.     pravastatin (PRAVACHOL) 10 MG tablet Take 10 mg by mouth daily.     QUEtiapine (SEROQUEL) 400 MG tablet Take 400 mg by mouth at bedtime.     rosuvastatin (CRESTOR) 10 MG tablet Take 10 mg by mouth daily. (Patient not taking: Reported on 07/23/2022)     sucralfate (CARAFATE) 1 GM/10ML suspension Take 2 g by mouth 3 times/day as needed-between meals & bedtime.     SUMAtriptan (IMITREX) 100 MG tablet Take 100 mg by mouth every 2 (two) hours as needed for migraine. May repeat in 2 hours if headache persists or recurs.     Tiotropium Bromide Monohydrate (SPIRIVA  RESPIMAT) 2.5 MCG/ACT AERS Inhale 2 puffs into the lungs daily. 4 g 11   traMADol (ULTRAM) 50 MG tablet Take 50 mg by mouth 4 (four) times daily.     Vitamin D, Ergocalciferol, (DRISDOL) 1.25 MG (50000 UNIT) CAPS capsule Take 50,000 Units by mouth once a week. (Patient not taking: Reported on 07/23/2022)     No current facility-administered medications for this visit.    Allergies as of 07/30/2022 - Review Complete 07/23/2022  Allergen Reaction Noted   Codeine Anaphylaxis 03/18/2022   Penicillins Anaphylaxis 10/06/2017      ROS:  General: Negative for anorexia, weight loss, fever, chills, fatigue, weakness. ENT: Negative for hoarseness, difficulty swallowing , nasal congestion. CV: Negative for chest pain, angina, palpitations, dyspnea on exertion, peripheral edema.  Respiratory: Negative for dyspnea at rest, dyspnea on exertion, cough, sputum, wheezing.  GI: See history of present illness. GU:  Negative for dysuria, hematuria, urinary incontinence, urinary frequency, nocturnal urination.  Endo: Negative for unusual weight change.    Physical Examination:   There were no vitals taken for this visit.  General: Well-nourished, well-developed in no acute distress.  Eyes: No icterus. Conjunctivae pink. Mouth: Oropharyngeal mucosa moist and pink , no lesions erythema or exudate. Lungs: Clear to auscultation bilaterally. Non-labored. Heart: Regular rate and rhythm, no murmurs rubs or gallops.  Abdomen: Bowel sounds are normal, nontender, nondistended, no hepatosplenomegaly or masses, no abdominal bruits or hernia , no rebound or guarding.   Extremities: No lower extremity edema. No clubbing or deformities. Neuro: Alert and oriented x 3.  Grossly intact. Skin: Warm and dry, no jaundice.   Psych: Alert and cooperative, normal mood and affect.   Imaging Studies: No results found.  Assessment and Plan:   Tammy Maxwell is a 62 y.o. y/o female with iron deficiency and B12 deficiency  anemia seen by hematology and referred to [place] for further evaluation.  Long-term use of NSAIDs which could have been the cause.  Plan 1.  Stop NSAID use 2.  EGD and colonoscopy if negative may consider capsule study of small bowel 3.  Continue iron and B12 replacement 4.  Celiac serology, H. pylori breath test, urinalysis complete workup    Dr Wyline Mood  MD,MRCP [facility]) Follow up in 3 months with Celso Amy  BP check ***

## 2022-08-05 ENCOUNTER — Ambulatory Visit: Admission: RE | Admit: 2022-08-05 | Payer: 59 | Source: Ambulatory Visit

## 2022-08-21 ENCOUNTER — Telehealth (HOSPITAL_COMMUNITY): Payer: Self-pay

## 2022-08-21 NOTE — Telephone Encounter (Signed)
Pt called [facility] to set up an appointment. Did explain that they sent the referral to [facility] and transferred her to their cardiac and pulmonary department!

## 2022-08-26 ENCOUNTER — Encounter: Payer: 59 | Attending: Student in an Organized Health Care Education/Training Program | Admitting: *Deleted

## 2022-08-26 ENCOUNTER — Encounter: Payer: Self-pay | Admitting: *Deleted

## 2022-08-26 DIAGNOSIS — J439 Emphysema, unspecified: Secondary | ICD-10-CM

## 2022-08-26 NOTE — Progress Notes (Signed)
Initial phone call completed. Dx can be found in [facility] 7/16. EP orientation scheduled for 8/22 at 2:30 pm.

## 2022-08-29 ENCOUNTER — Ambulatory Visit: Payer: 59

## 2022-08-30 ENCOUNTER — Other Ambulatory Visit: Payer: Self-pay

## 2022-08-30 MED ORDER — ISOSORBIDE MONONITRATE ER 30 MG PO TB24: 15.0000 mg | ORAL_TABLET | Freq: Every day | ORAL | 0 refills | Status: DC

## 2022-08-30 NOTE — Telephone Encounter (Signed)
last visit on 02/13/22 with plan to f/u in 6 months.  Please schedule f/u.  Thanks

## 2022-08-30 NOTE — Telephone Encounter (Signed)
Requested Prescriptions   Signed Prescriptions Disp Refills   isosorbide mononitrate (IMDUR) 30 MG 24 hr tablet 45 tablet 0    Sig: Take 0.5 tablets (15 mg total) by mouth daily. PLEASE CALL OFFICE TO SCHEDULE APPOINTMENT PRIOR TO NEXT REFILL    Authorizing Provider: Debbe Odea    Ordering User: Guerry Minors

## 2022-09-04 NOTE — Telephone Encounter (Signed)
LMOV to schedule  

## 2022-09-16 ENCOUNTER — Ambulatory Visit: Payer: 59 | Admitting: Student in an Organized Health Care Education/Training Program

## 2022-09-16 ENCOUNTER — Encounter: Payer: 59 | Attending: Student in an Organized Health Care Education/Training Program

## 2022-09-16 VITALS — Ht 68.0 in | Wt 106.1 lb

## 2022-09-16 DIAGNOSIS — J439 Emphysema, unspecified: Secondary | ICD-10-CM | POA: Diagnosis present

## 2022-09-16 DIAGNOSIS — F17210 Nicotine dependence, cigarettes, uncomplicated: Secondary | ICD-10-CM | POA: Diagnosis not present

## 2022-09-16 DIAGNOSIS — R0602 Shortness of breath: Secondary | ICD-10-CM | POA: Insufficient documentation

## 2022-09-16 NOTE — Progress Notes (Signed)
Pulmonary Individual Treatment Plan  Patient Details  Name: Tammy Maxwell MRN: [id number] Date of Birth: 1960-11-09 Referring Provider:   Flowsheet Row Pulmonary Rehab from 09/16/2022 in [facility] Cardiac and Pulmonary Rehab  Referring Provider Dr. Raechel Chute, MD       Initial Encounter Date:  Flowsheet Row Pulmonary Rehab from 09/16/2022 in [facility] Cardiac and Pulmonary Rehab  Date 09/16/22       Visit Diagnosis: Pulmonary emphysema, unspecified emphysema type (HCC)  Patient's Home Medications on Admission:  Current Outpatient Medications:    albuterol (PROVENTIL) (2.5 MG/3ML) 0.083% nebulizer solution, Take 2.5 mg by nebulization 4 (four) times daily., Disp: , Rfl:    aspirin 81 MG chewable tablet, Chew 81 mg by mouth daily., Disp: , Rfl:    budesonide-formoterol (SYMBICORT) 160-4.5 MCG/ACT inhaler, Inhale 2 puffs into the lungs 2 (two) times daily., Disp: , Rfl:    buPROPion (WELLBUTRIN SR) 150 MG 12 hr tablet, Take 150 mg by mouth 2 (two) times daily., Disp: , Rfl:    clonazePAM (KLONOPIN) 1 MG tablet, Take 1 mg by mouth at bedtime., Disp: , Rfl:    cyanocobalamin (VITAMIN B12) 500 MCG tablet, Take 500 mcg by mouth daily., Disp: , Rfl:    ferrous sulfate 324 MG TBEC, Take 324 mg by mouth., Disp: , Rfl:    IPRATROPIUM BROMIDE HFA IN, Inhale 2 puffs into the lungs 4 (four) times daily., Disp: , Rfl:    isosorbide mononitrate (IMDUR) 30 MG 24 hr tablet, Take 0.5 tablets (15 mg total) by mouth daily. PLEASE CALL OFFICE TO SCHEDULE APPOINTMENT PRIOR TO NEXT REFILL, Disp: 45 tablet, Rfl: 0   levothyroxine (SYNTHROID) 50 MCG tablet, Take 50 mcg by mouth daily., Disp: , Rfl:    meloxicam (MOBIC) 15 MG tablet, Take 15 mg by mouth at bedtime., Disp: , Rfl:    naloxone (NARCAN) nasal spray 4 mg/0.1 mL, SMARTSIG:Both Nares, Disp: , Rfl:    nicotine (NICODERM CQ - DOSED IN MG/24 HOURS) 14 mg/24hr patch, Place 14 mg onto the skin daily., Disp: , Rfl:    nitrofurantoin, macrocrystal-monohydrate,  (MACROBID) 100 MG capsule, Take 100 mg by mouth 2 (two) times daily. (Patient not taking: Reported on 08/26/2022), Disp: , Rfl:    nitroGLYCERIN (NITROSTAT) 0.3 MG SL tablet, Place 0.3 mg under the tongue every 5 (five) minutes as needed for chest pain., Disp: , Rfl:    omeprazole (PRILOSEC) 40 MG capsule, Take 40 mg by mouth 2 (two) times daily., Disp: , Rfl:    PARoxetine (PAXIL) 20 MG tablet, Take 20 mg by mouth at bedtime., Disp: , Rfl:    pravastatin (PRAVACHOL) 10 MG tablet, Take 10 mg by mouth daily., Disp: , Rfl:    QUEtiapine (SEROQUEL) 400 MG tablet, Take 400 mg by mouth at bedtime., Disp: , Rfl:    sucralfate (CARAFATE) 1 GM/10ML suspension, Take 2 g by mouth 3 times/day as needed-between meals & bedtime., Disp: , Rfl:    SUMAtriptan (IMITREX) 100 MG tablet, Take 100 mg by mouth every 2 (two) hours as needed for migraine. May repeat in 2 hours if headache persists or recurs., Disp: , Rfl:    Tiotropium Bromide Monohydrate (SPIRIVA RESPIMAT) 2.5 MCG/ACT AERS, Inhale 2 puffs into the lungs daily., Disp: 4 g, Rfl: 11   traMADol (ULTRAM) 50 MG tablet, Take 50 mg by mouth 4 (four) times daily., Disp: , Rfl:    Vitamin D, Ergocalciferol, (DRISDOL) 1.25 MG (50000 UNIT) CAPS capsule, Take 50,000 Units by mouth once a week.,  Disp: , Rfl:   Past Medical History: Past Medical History:  Diagnosis Date   Anemia    Arthritis    Bronchitis    Chest pain    COPD (chronic obstructive pulmonary disease) (HCC)    Depression    Dysrhythmia    GERD (gastroesophageal reflux disease)    Headache    Heart murmur    History of hiatal hernia    Pneumonia    Pneumothorax    Wears dentures    full upper    Tobacco Use: Social History   Tobacco Use  Smoking Status Every Day   Current packs/day: 0.50   Average packs/day: 0.5 packs/day for 47.0 years (23.5 ttl pk-yrs)   Types: Cigarettes  Smokeless Tobacco Never  Tobacco Comments   2 cigs daily- 07/23/2022    Labs: Review Flowsheet        Latest Ref Rng & Units 02/13/2022  Labs for ITP Cardiac and Pulmonary Rehab  Cholestrol 0 - 200 mg/dL 409   LDL (calc) 0 - 99 mg/dL 97   HDL-C >81 mg/dL 73   Trlycerides <191 mg/dL 87     Details             Pulmonary Assessment Scores:  Pulmonary Assessment Scores     Row Name 09/16/22 1336         ADL UCSD   ADL Phase Entry     SOB Score total 82     Rest 3     Walk 4     Stairs 5     Bath 4     Dress 3     Shop 0       CAT Score   CAT Score 36       mMRC Score   mMRC Score 3              UCSD: Self-administered rating of dyspnea associated with activities of daily living (ADLs) 6-point scale (0 = "not at all" to 5 = "maximal or unable to do because of breathlessness")  Scoring Scores range from 0 to 120.  Minimally important difference is 5 units  CAT: CAT can identify the health impairment of COPD patients and is better correlated with disease progression.  CAT has a scoring range of zero to 40. The CAT score is classified into four groups of low (less than 10), medium (10 - 20), high (21-30) and very high (31-40) based on the impact level of disease on health status. A CAT score over 10 suggests significant symptoms.  A worsening CAT score could be explained by an exacerbation, poor medication adherence, poor inhaler technique, or progression of COPD or comorbid conditions.  CAT MCID is 2 points  mMRC: mMRC (Modified Medical Research Council) Dyspnea Scale is used to assess the degree of baseline functional disability in patients of respiratory disease due to dyspnea. No minimal important difference is established. A decrease in score of 1 point or greater is considered a positive change.   Pulmonary Function Assessment:   Exercise Target Goals: Exercise Program Goal: Individual exercise prescription set using results from initial 6 min walk test and THRR while considering  patient's activity barriers and safety.   Exercise Prescription  Goal: Initial exercise prescription builds to 30-45 minutes a day of aerobic activity, 2-3 days per week.  Home exercise guidelines will be given to patient during program as part of exercise prescription that the participant will acknowledge.  Education: Aerobic Exercise: - Group verbal and  visual presentation on the components of exercise prescription. Introduces F.I.T.T principle from ACSM for exercise prescriptions.  Reviews F.I.T.T. principles of aerobic exercise including progression. Written material given at graduation. Flowsheet Row Pulmonary Rehab from 09/16/2022 in [facility] Cardiac and Pulmonary Rehab  Education need identified 09/16/22       Education: Resistance Exercise: - Group verbal and visual presentation on the components of exercise prescription. Introduces F.I.T.T principle from ACSM for exercise prescriptions  Reviews F.I.T.T. principles of resistance exercise including progression. Written material given at graduation.    Education: Exercise & Equipment Safety: - Individual verbal instruction and demonstration of equipment use and safety with use of the equipment. Flowsheet Row Pulmonary Rehab from 09/16/2022 in [facility] Cardiac and Pulmonary Rehab  Date 09/16/22  Educator NT  Instruction Review Code 1- Verbalizes Understanding       Education: Exercise Physiology & General Exercise Guidelines: - Group verbal and written instruction with models to review the exercise physiology of the cardiovascular system and associated critical values. Provides general exercise guidelines with specific guidelines to those with heart or lung disease.    Education: Flexibility, Balance, Mind/Body Relaxation: - Group verbal and visual presentation with interactive activity on the components of exercise prescription. Introduces F.I.T.T principle from ACSM for exercise prescriptions. Reviews F.I.T.T. principles of flexibility and balance exercise training including progression. Also discusses  the mind body connection.  Reviews various relaxation techniques to help reduce and manage stress (i.e. Deep breathing, progressive muscle relaxation, and visualization). Balance handout provided to take home. Written material given at graduation.   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 HR 107 bpm     Max Ex. BP 124/72     2 Minute Post BP 112/68       Interval HR   1 Minute HR 101     2 Minute HR 105     3 Minute HR 106     4 Minute HR 106     5 Minute HR 107     6 Minute HR 106     2 Minute Post HR 98     Interval Heart Rate? Yes       Interval Oxygen   Interval Oxygen? Yes     Baseline Oxygen Saturation % 99 %     1 Minute Oxygen Saturation % 99 %  1 Minute Liters of Oxygen 3 L     2 Minute Oxygen Saturation % 97 %     2 Minute Liters of Oxygen 3 L     3 Minute Oxygen Saturation % 97 %     3 Minute Liters of Oxygen 3 L     4 Minute Oxygen Saturation % 98 %     4 Minute Liters of Oxygen 3 L     5 Minute Oxygen Saturation % 97 %     5 Minute Liters of Oxygen 3 L     6 Minute Oxygen Saturation % 94 %     6 Minute Liters of Oxygen 3 L     2 Minute Post Oxygen Saturation % 95 %     2 Minute Post Liters of Oxygen 3 L             Oxygen  Initial Assessment:  Oxygen Initial Assessment - 08/26/22 1552       Home Oxygen   Home Oxygen Device Home Concentrator;E-Tanks    Sleep Oxygen Prescription Continuous    Liters per minute 2.5    Home Exercise Oxygen Prescription Pulsed    Liters per minute 2.5    Home Resting Oxygen Prescription Pulsed    Liters per minute 2.5    Compliance with Home Oxygen Use Yes      Intervention   Short Term Goals To learn and exhibit compliance with exercise, home and travel O2 prescription;To learn and understand importance of maintaining oxygen saturations>88%;To learn and demonstrate proper use of respiratory medications;To learn and understand importance of monitoring SPO2 with pulse oximeter and demonstrate accurate use of the pulse oximeter.;To learn and demonstrate proper pursed lip breathing techniques or other breathing techniques.     Long  Term Goals Exhibits compliance with exercise, home  and travel O2 prescription;Maintenance of O2 saturations>88%;Compliance with respiratory medication;Verbalizes importance of monitoring SPO2 with pulse oximeter and return demonstration;Exhibits proper breathing techniques, such as pursed lip breathing or other method taught during program session             Oxygen Re-Evaluation:   Oxygen Discharge (Final Oxygen Re-Evaluation):   Initial Exercise Prescription:  Initial Exercise Prescription - 09/16/22 1300       Date of Initial Exercise RX and Referring Provider   Date 09/16/22    Referring Provider Dr. Raechel Chute, MD      Oxygen   Oxygen Continuous    Liters 3    Maintain Oxygen Saturation 88% or higher      Treadmill   MPH 1.8    Grade 0.5    Minutes 15    METs 2.5      Recumbant Bike   Level 2    RPM 50    Watts 25    Minutes 15    METs 3.66      NuStep   Level 2    SPM 80    Minutes 15    METs 3.66      Prescription Details   Frequency (times per week) 2    Duration Progress to 30 minutes of continuous  aerobic without signs/symptoms of physical distress      Intensity   THRR 40-80% of Max Heartrate 109-143    Ratings of Perceived Exertion 11-13    Perceived Dyspnea 0-4      Progression   Progression Continue to progress workloads to maintain intensity without signs/symptoms of physical distress.  Resistance Training   Training Prescription Yes    Weight 3 lb    Reps 10-15             Perform Capillary Blood Glucose checks as needed.  Exercise Prescription Changes:   Exercise Prescription Changes     Row Name 09/16/22 1300             Response to Exercise   Blood Pressure (Admit) 116/70       Blood Pressure (Exercise) 124/72       Blood Pressure (Exit) 112/68       Heart Rate (Admit) 75 bpm       Heart Rate (Exercise) 107 bpm       Heart Rate (Exit) 98 bpm       Oxygen Saturation (Admit) 99 %       Oxygen Saturation (Exercise) 94 %       Oxygen Saturation (Exit) 95 %       Rating of Perceived Exertion (Exercise) 9       Perceived Dyspnea (Exercise) 1       Symptoms Fatigue       Comments Results                Exercise Comments:   Exercise Goals and Review:   Exercise Goals     Row Name 09/16/22 1342             Exercise Goals   Increase Physical Activity Yes       Intervention Provide advice, education, support and counseling about physical activity/exercise needs.;Develop an individualized exercise prescription for aerobic and resistive training based on initial evaluation findings, risk stratification, comorbidities and participant's personal goals.       Expected Outcomes Short Term: Attend rehab on a regular basis to increase amount of physical activity.;Long Term: Add in home exercise to make exercise part of routine and to increase amount of physical activity.;Long Term: Exercising regularly at least 3-5 days a week.       Increase Strength and Stamina Yes       Intervention Provide advice, education, support and counseling  about physical activity/exercise needs.;Develop an individualized exercise prescription for aerobic and resistive training based on initial evaluation findings, risk stratification, comorbidities and participant's personal goals.       Expected Outcomes Short Term: Increase workloads from initial exercise prescription for resistance, speed, and METs.;Short Term: Perform resistance training exercises routinely during rehab and add in resistance training at home;Long Term: Improve cardiorespiratory fitness, muscular endurance and strength as measured by increased METs and functional capacity ( )       Able to understand and use rate of perceived exertion (RPE) scale Yes       Intervention Provide education and explanation on how to use RPE scale       Expected Outcomes Short Term: Able to use RPE daily in rehab to express subjective intensity level;Long Term:  Able to use RPE to guide intensity level when exercising independently       Able to understand and use Dyspnea scale Yes       Intervention Provide education and explanation on how to use Dyspnea scale       Expected Outcomes Short Term: Able to use Dyspnea scale daily in rehab to express subjective sense of shortness of breath during exertion;Long Term: Able to use Dyspnea scale to guide intensity level when exercising independently       Knowledge and understanding of Target Heart Rate  Range (THRR) Yes       Intervention Provide education and explanation of THRR including how the numbers were predicted and where they are located for reference       Expected Outcomes Short Term: Able to state/look up THRR;Long Term: Able to use THRR to govern intensity when exercising independently;Short Term: Able to use daily as guideline for intensity in rehab       Able to check pulse independently Yes       Intervention Provide education and demonstration on how to check pulse in carotid and radial arteries.;Review the importance of being able to check your  own pulse for safety during independent exercise       Expected Outcomes Short Term: Able to explain why pulse checking is important during independent exercise;Long Term: Able to check pulse independently and accurately       Understanding of Exercise Prescription Yes       Intervention Provide education, explanation, and written materials on patient's individual exercise prescription       Expected Outcomes Short Term: Able to explain program exercise prescription;Long Term: Able to explain home exercise prescription to exercise independently                Exercise Goals Re-Evaluation :   Discharge Exercise Prescription (Final Exercise Prescription Changes):  Exercise Prescription Changes - 09/16/22 1300       Response to Exercise   Blood Pressure (Admit) 116/70    Blood Pressure (Exercise) 124/72    Blood Pressure (Exit) 112/68    Heart Rate (Admit) 75 bpm    Heart Rate (Exercise) 107 bpm    Heart Rate (Exit) 98 bpm    Oxygen Saturation (Admit) 99 %    Oxygen Saturation (Exercise) 94 %    Oxygen Saturation (Exit) 95 %    Rating of Perceived Exertion (Exercise) 9    Perceived Dyspnea (Exercise) 1    Symptoms Fatigue    Comments Results             Nutrition:  Target Goals: Understanding of nutrition guidelines, daily intake of sodium 1500mg , cholesterol 200mg , calories 30% from fat and 7% or less from saturated fats, daily to have 5 or more servings of fruits and vegetables.  Education: All About Nutrition: -Group instruction provided by verbal, written material, interactive activities, discussions, models, and posters to present general guidelines for heart healthy nutrition including fat, fiber, MyPlate, the role of sodium in heart healthy nutrition, utilization of the nutrition label, and utilization of this knowledge for meal planning. Follow up email sent as well. Written material given at graduation.   Biometrics:  Pre Biometrics - 09/16/22 1343        Pre Biometrics   Height 5\' 8"  (1.727 m)    Weight 106 lb 1.6 oz (48.1 kg)    Waist Circumference 26.5 inches    Hip Circumference 33 inches    Waist to Hip Ratio 0.8 %    BMI (Calculated) 16.14    Single Leg Stand 4.8 seconds              Nutrition Therapy Plan and Nutrition Goals:  Nutrition Therapy & Goals - 09/16/22 1332       Nutrition Therapy   RD appointment deferred Yes      Intervention Plan   Intervention Prescribe, educate and counsel regarding individualized specific dietary modifications aiming towards targeted core components such as weight, hypertension, lipid management, diabetes, heart failure and other comorbidities.  Expected Outcomes Short Term Goal: Understand basic principles of dietary content, such as calories, fat, sodium, cholesterol and nutrients.;Short Term Goal: A plan has been developed with personal nutrition goals set during dietitian appointment.;Long Term Goal: Adherence to prescribed nutrition plan.             Nutrition Assessments:  MEDIFICTS Score Key: ?70 Need to make dietary changes  40-70 Heart Healthy Diet ? 40 Therapeutic Level Cholesterol Diet  Flowsheet Row Pulmonary Rehab from 09/16/2022 in [facility] Cardiac and Pulmonary Rehab  Picture Your Plate Total Score on Admission 52      Picture Your Plate Scores: <16 Unhealthy dietary pattern with much room for improvement. 41-50 Dietary pattern unlikely to meet recommendations for good health and room for improvement. 51-60 More healthful dietary pattern, with some room for improvement.  >60 Healthy dietary pattern, although there may be some specific behaviors that could be improved.   Nutrition Goals Re-Evaluation:   Nutrition Goals Discharge (Final Nutrition Goals Re-Evaluation):   Psychosocial: Target Goals: Acknowledge presence or absence of significant depression and/or stress, maximize coping skills, provide positive support system. Participant is able to verbalize  types and ability to use techniques and skills needed for reducing stress and depression.   Education: Stress, Anxiety, and Depression - Group verbal and visual presentation to define topics covered.  Reviews how body is impacted by stress, anxiety, and depression.  Also discusses healthy ways to reduce stress and to treat/manage anxiety and depression.  Written material given at graduation.   Education: Sleep Hygiene -Provides group verbal and written instruction about how sleep can affect your health.  Define sleep hygiene, discuss sleep cycles and impact of sleep habits. Review good sleep hygiene tips.    Initial Review & Psychosocial Screening:  Initial Psych Review & Screening - 08/26/22 1631       Initial Review   Current issues with Current Psychotropic Meds      Family Dynamics   Good Support System? Yes   Lives alone but her son lives close by and is great support!     Screening Interventions   Interventions Encouraged to exercise    Expected Outcomes Short Term goal: Utilizing psychosocial counselor, staff and physician to assist with identification of specific Stressors or current issues interfering with healing process. Setting desired goal for each stressor or current issue identified.;Long Term Goal: Stressors or current issues are controlled or eliminated.;Short Term goal: Identification and review with participant of any Quality of Life or Depression concerns found by scoring the questionnaire.;Long Term goal: The participant improves quality of Life and PHQ9 Scores as seen by post scores and/or verbalization of changes             Quality of Life Scores:  Scores of 19 and below usually indicate a poorer quality of life in these areas.  A difference of  2-3 points is a clinically meaningful difference.  A difference of 2-3 points in the total score of the Quality of Life Index has been associated with significant improvement in overall quality of life, self-image,  physical symptoms, and general health in studies assessing change in quality of life.  PHQ-9: Review Flowsheet       09/16/2022 03/18/2022  Depression screen PHQ 2/9  Decreased Interest 2 1  Down, Depressed, Hopeless 2 1  PHQ - 2 Score 4 2  Altered sleeping 1 -  Tired, decreased energy 2 -  Change in appetite 1 -  Feeling bad or failure about yourself  2 -  Trouble concentrating 1 -  Moving slowly or fidgety/restless 1 -  Suicidal thoughts 0 -  PHQ-9 Score 12 -  Difficult doing work/chores Somewhat difficult -    Details           Interpretation of Total Score  Total Score Depression Severity:  1-4 = Minimal depression, 5-9 = Mild depression, 10-14 = Moderate depression, 15-19 = Moderately severe depression, 20-27 = Severe depression   Psychosocial Evaluation and Intervention:  Psychosocial Evaluation - 08/26/22 1635       Psychosocial Evaluation & Interventions   Interventions Encouraged to exercise with the program and follow exercise prescription    Comments Darcel "Hope" is coming to pulmonary rehab post emphysema.  She has decreased her tobacco use to now 2 cigarettes per day. She is using nicotene patch and already has cessation material and declines further information at this time. Hope is looking to improve her breathing as she has been having progressive dyspnea, reduce shortness of breath with ADL's and improve muscle strength from rehab. She lives alone and has a son that lives close by that is great support. Hope currently uses home oxygen @ 2.5L/min pulsed. She does have some balance concerns and uses a cane with ambulation. Advised she bring cane with her to rehab. She has had one fall in the past year, with only injury to her toe. 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 She has no barriers to attending the program and is looking forward to starting rehab.    Expected Outcomes Short: Attend pulmonary rehab for  education and exercise.  Long: Develop and maintain positive self care habits.    Continue Psychosocial Services  Follow up required by staff             Psychosocial Re-Evaluation:   Psychosocial Discharge (Final Psychosocial Re-Evaluation):   Education: Education Goals: Education classes will be provided on a weekly basis, covering required topics. Participant will state understanding/return demonstration of topics presented.  Learning Barriers/Preferences:   General Pulmonary Education Topics:  Infection Prevention: - Provides verbal and written material to individual with discussion of infection control including proper hand washing and proper equipment cleaning during exercise session. Flowsheet Row Pulmonary Rehab from 09/16/2022 in [facility] Cardiac and Pulmonary Rehab  Date 09/16/22  Educator NT  Instruction Review Code 1- Verbalizes Understanding       Falls Prevention: - Provides verbal and written material to individual with discussion of falls prevention and safety. Flowsheet Row Pulmonary Rehab from 09/16/2022 in [facility] Cardiac and Pulmonary Rehab  Date 09/16/22  Educator NT  Instruction Review Code 1- Verbalizes Understanding       Chronic Lung Disease Review: - Group verbal instruction with posters, models, PowerPoint presentations and videos,  to review new updates, new respiratory medications, new advancements in procedures and treatments. Providing information on websites and "800" numbers for continued self-education. Includes information about supplement oxygen, available portable oxygen systems, continuous and intermittent flow rates, oxygen safety, concentrators, and Medicare reimbursement for oxygen. Explanation of Pulmonary Drugs, including class, frequency, complications, importance of spacers, rinsing mouth after steroid MDI's, and proper cleaning methods for nebulizers. Review of basic lung anatomy and physiology related to function, structure, and  complications of lung disease. Review of risk factors. Discussion about methods for diagnosing sleep apnea and types of masks and machines for OSA. Includes a review of the use of types of environmental controls: home humidity, furnaces, filters,  dust mite/pet prevention, HEPA vacuums. Discussion about weather changes, air quality and the benefits of nasal washing. Instruction on Warning signs, infection symptoms, calling MD promptly, preventive modes, and value of vaccinations. Review of effective airway clearance, coughing and/or vibration techniques. Emphasizing that all should Create an Action Plan. Written material given at graduation. Flowsheet Row Pulmonary Rehab from 09/16/2022 in [facility] Cardiac and Pulmonary Rehab  Education need identified 09/16/22       AED/CPR: - Group verbal and written instruction with the use of models to demonstrate the basic use of the AED with the basic ABC's of resuscitation.    Anatomy and Cardiac Procedures: - Group verbal and visual presentation and models provide information about basic cardiac anatomy and function. Reviews the testing methods done to diagnose heart disease and the outcomes of the test results. Describes the treatment choices: Medical Management, Angioplasty, or Coronary Bypass Surgery for treating various heart conditions including Myocardial Infarction, Angina, Valve Disease, and Cardiac Arrhythmias.  Written material given at graduation. Flowsheet Row Pulmonary Rehab from 09/16/2022 in [facility] Cardiac and Pulmonary Rehab  Education need identified 09/16/22       Medication Safety: - Group verbal and visual instruction to review commonly prescribed medications for heart and lung disease. Reviews the medication, class of the drug, and side effects. Includes the steps to properly store meds and maintain the prescription regimen.  Written material given at graduation. Flowsheet Row Pulmonary Rehab from 09/16/2022 in [facility] Cardiac and Pulmonary Rehab   Education need identified 09/16/22       Other: -Provides group and verbal instruction on various topics (see comments)   Knowledge Questionnaire Score:  Knowledge Questionnaire Score - 09/16/22 1331       Knowledge Questionnaire Score   Pre Score 12/18              Core Components/Risk Factors/Patient Goals at Admission:  Personal Goals and Risk Factors at Admission - 08/26/22 1633       Core Components/Risk Factors/Patient Goals on Admission    Weight Management Yes    Intervention Weight Management: Develop a combined nutrition and exercise program designed to reach desired caloric intake, while maintaining appropriate intake of nutrient and fiber, sodium and fats, and appropriate energy expenditure required for the weight goal.;Weight Management: Provide education and appropriate resources to help participant work on and attain dietary goals.    Admit Weight 109 lb (49.4 kg)    Expected Outcomes Short Term: Continue to assess and modify interventions until short term weight is achieved;Long Term: Adherence to nutrition and physical activity/exercise program aimed toward attainment of established weight goal;Weight Maintenance: Understanding of the daily nutrition guidelines, which includes 25-35% calories from fat, 7% or less cal from saturated fats, less than 200mg  cholesterol, less than 1.5gm of sodium, & 5 or more servings of fruits and vegetables daily;Understanding recommendations for meals to include 15-35% energy as protein, 25-35% energy from fat, 35-60% energy from carbohydrates, less than 200mg  of dietary cholesterol, 20-35 gm of total fiber daily;Understanding of distribution of calorie intake throughout the day with the consumption of 4-5 meals/snacks    Tobacco Cessation Yes    Number of packs per day 2 cigarettes per day    Intervention Assist the participant in steps to quit. Provide individualized education and counseling about committing to Tobacco Cessation,  relapse prevention, and pharmacological support that can be provided by physician.    Improve shortness of breath with ADL's Yes    Intervention Provide education, individualized exercise plan  and daily activity instruction to help decrease symptoms of SOB with activities of daily living.    Expected Outcomes Short Term: Improve cardiorespiratory fitness to achieve a reduction of symptoms when performing ADLs             Education:Diabetes - Individual verbal and written instruction to review signs/symptoms of diabetes, desired ranges of glucose level fasting, after meals and with exercise. Acknowledge that pre and post exercise glucose checks will be done for 3 sessions at entry of program.   Know Your Numbers and Heart Failure: - Group verbal and visual instruction to discuss disease risk factors for cardiac and pulmonary disease and treatment options.  Reviews associated critical values for Overweight/Obesity, Hypertension, Cholesterol, and Diabetes.  Discusses basics of heart failure: signs/symptoms and treatments.  Introduces Heart Failure Zone chart for action plan for heart failure.  Written material given at graduation.   Core Components/Risk Factors/Patient Goals Review:    Core Components/Risk Factors/Patient Goals at Discharge (Final Review):    ITP Comments:  ITP Comments     Row Name 08/26/22 1628 09/16/22 1329         ITP Comments Initial phone call completed. Dx can be found in [facility] 7/16. EP orientation scheduled for 8/22 at 2:30 pm. Completed and gym orientation. Initial ITP created and sent for review to Dr. Vida Rigger, Medical Director.               Comments: Initial ITP

## 2022-09-16 NOTE — Patient Instructions (Signed)
Patient Instructions  Patient Details  Name: Tammy Maxwell MRN: [id number] Date of Birth: May 14, 1960 Referring Provider:  Raechel Chute, MD  Below are your personal goals for exercise, nutrition, and risk factors. Our goal is to help you stay on track towards obtaining and maintaining these goals. We will be discussing your progress on these goals with you throughout the program.  Initial Exercise Prescription:  Initial Exercise Prescription - 09/16/22 1300       Date of Initial Exercise RX and Referring Provider   Date 09/16/22    Referring Provider Dr. Raechel Chute, MD      Oxygen   Oxygen Continuous    Liters 3    Maintain Oxygen Saturation 88% or higher      Treadmill   MPH 1.8    Grade 0.5    Minutes 15    METs 2.5      Recumbant Bike   Level 2    RPM 50    Watts 25    Minutes 15    METs 3.66      NuStep   Level 2    SPM 80    Minutes 15    METs 3.66      Prescription Details   Frequency (times per week) 2    Duration Progress to 30 minutes of continuous aerobic without signs/symptoms of physical distress      Intensity   THRR 40-80% of Max Heartrate 109-143    Ratings of Perceived Exertion 11-13    Perceived Dyspnea 0-4      Progression   Progression Continue to progress workloads to maintain intensity without signs/symptoms of physical distress.      Resistance Training   Training Prescription Yes    Weight 3 lb    Reps 10-15             Exercise Goals: Frequency: Be able to perform aerobic exercise two to three times per week in program working toward 2-5 days per week of home exercise.  Intensity: Work with a perceived exertion of 11 (fairly light) - 15 (hard) while following your exercise prescription.  We will make changes to your prescription with you as you progress through the program.   Duration: Be able to do 30 to 45 minutes of continuous aerobic exercise in addition to a 5 minute warm-up and a 5 minute cool-down routine.    Nutrition Goals: Your personal nutrition goals will be established when you do your nutrition analysis with the dietician.  The following are general nutrition guidelines to follow: Cholesterol < 200mg /day Sodium < 1500mg /day Fiber: Women over 50 yrs - 21 grams per day  Personal Goals:  Personal Goals and Risk Factors at Admission - 08/26/22 1633       Core Components/Risk Factors/Patient Goals on Admission    Weight Management Yes    Intervention Weight Management: Develop a combined nutrition and exercise program designed to reach desired caloric intake, while maintaining appropriate intake of nutrient and fiber, sodium and fats, and appropriate energy expenditure required for the weight goal.;Weight Management: Provide education and appropriate resources to help participant work on and attain dietary goals.    Admit Weight 109 lb (49.4 kg)    Expected Outcomes Short Term: Continue to assess and modify interventions until short term weight is achieved;Long Term: Adherence to nutrition and physical activity/exercise program aimed toward attainment of established weight goal;Weight Maintenance: Understanding of the daily nutrition guidelines, which includes 25-35% calories from fat, 7% or less cal  from saturated fats, less than 200mg  cholesterol, less than 1.5gm of sodium, & 5 or more servings of fruits and vegetables daily;Understanding recommendations for meals to include 15-35% energy as protein, 25-35% energy from fat, 35-60% energy from carbohydrates, less than 200mg  of dietary cholesterol, 20-35 gm of total fiber daily;Understanding of distribution of calorie intake throughout the day with the consumption of 4-5 meals/snacks    Tobacco Cessation Yes    Number of packs per day 2 cigarettes per day    Intervention Assist the participant in steps to quit. Provide individualized education and counseling about committing to Tobacco Cessation, relapse prevention, and pharmacological support that  can be provided by physician.    Improve shortness of breath with ADL's Yes    Intervention Provide education, individualized exercise plan and daily activity instruction to help decrease symptoms of SOB with activities of daily living.    Expected Outcomes Short Term: Improve cardiorespiratory fitness to achieve a reduction of symptoms when performing ADLs             Tobacco Use Initial Evaluation: Social History   Tobacco Use  Smoking Status Every Day   Current packs/day: 0.50   Average packs/day: 0.5 packs/day for 47.0 years (23.5 ttl pk-yrs)   Types: Cigarettes  Smokeless Tobacco Never  Tobacco Comments   2 cigs daily- 07/23/2022    Exercise Goals and Review:  Exercise Goals     Row Name 09/16/22 1342             Exercise Goals   Increase Physical Activity Yes       Intervention Provide advice, education, support and counseling about physical activity/exercise needs.;Develop an individualized exercise prescription for aerobic and resistive training based on initial evaluation findings, risk stratification, comorbidities and participant's personal goals.       Expected Outcomes Short Term: Attend rehab on a regular basis to increase amount of physical activity.;Long Term: Add in home exercise to make exercise part of routine and to increase amount of physical activity.;Long Term: Exercising regularly at least 3-5 days a week.       Increase Strength and Stamina Yes       Intervention Provide advice, education, support and counseling about physical activity/exercise needs.;Develop an individualized exercise prescription for aerobic and resistive training based on initial evaluation findings, risk stratification, comorbidities and participant's personal goals.       Expected Outcomes Short Term: Increase workloads from initial exercise prescription for resistance, speed, and METs.;Short Term: Perform resistance training exercises routinely during rehab and add in resistance  training at home;Long Term: Improve cardiorespiratory fitness, muscular endurance and strength as measured by increased METs and functional capacity ( )       Able to understand and use rate of perceived exertion (RPE) scale Yes       Intervention Provide education and explanation on how to use RPE scale       Expected Outcomes Short Term: Able to use RPE daily in rehab to express subjective intensity level;Long Term:  Able to use RPE to guide intensity level when exercising independently       Able to understand and use Dyspnea scale Yes       Intervention Provide education and explanation on how to use Dyspnea scale       Expected Outcomes Short Term: Able to use Dyspnea scale daily in rehab to express subjective sense of shortness of breath during exertion;Long Term: Able to use Dyspnea scale to guide intensity level when exercising independently  Knowledge and understanding of Target Heart Rate Range (THRR) Yes       Intervention Provide education and explanation of THRR including how the numbers were predicted and where they are located for reference       Expected Outcomes Short Term: Able to state/look up THRR;Long Term: Able to use THRR to govern intensity when exercising independently;Short Term: Able to use daily as guideline for intensity in rehab       Able to check pulse independently Yes       Intervention Provide education and demonstration on how to check pulse in carotid and radial arteries.;Review the importance of being able to check your own pulse for safety during independent exercise       Expected Outcomes Short Term: Able to explain why pulse checking is important during independent exercise;Long Term: Able to check pulse independently and accurately       Understanding of Exercise Prescription Yes       Intervention Provide education, explanation, and written materials on patient's individual exercise prescription       Expected Outcomes Short Term: Able to explain  program exercise prescription;Long Term: Able to explain home exercise prescription to exercise independently

## 2022-09-19 ENCOUNTER — Encounter: Payer: 59 | Admitting: *Deleted

## 2022-09-19 DIAGNOSIS — J439 Emphysema, unspecified: Secondary | ICD-10-CM

## 2022-09-19 NOTE — Progress Notes (Signed)
Daily Session Note  Patient Details  Name: Tammy Maxwell MRN: [id number] Date of Birth: Jan 29, 1960 Referring Provider:   Flowsheet Row Pulmonary Rehab from 09/16/2022 in [facility] Cardiac and Pulmonary Rehab  Referring Provider Dr. Raechel Chute, MD       Encounter Date: 09/19/2022  Check In:  Session Check In - 09/19/22 1008       Check-In   Supervising physician immediately available to respond to emergencies See telemetry face sheet for immediately available ER MD    Location [facility]-Cardiac & Pulmonary Rehab    Staff Present Maxon Conetta BS, , Exercise Physiologist;Megan Katrinka Blazing, RN, Silas Flood, BS, Exercise Physiologist    Virtual Visit No    Medication changes reported     No    Fall or balance concerns reported    No    Tobacco Cessation No Change    Current number of cigarettes/nicotine per day     2    Warm-up and Cool-down Performed on first and last piece of equipment    Resistance Training Performed Yes    VAD Patient? No    PAD/SET Patient? No      Pain Assessment   Currently in Pain? No/denies                Social History   Tobacco Use  Smoking Status Every Day   Current packs/day: 0.50   Average packs/day: 0.5 packs/day for 47.0 years (23.5 ttl pk-yrs)   Types: Cigarettes  Smokeless Tobacco Never  Tobacco Comments   2 cigs daily- 07/23/2022    Goals Met:  Independence with exercise equipment Exercise tolerated well No report of concerns or symptoms today Strength training completed today  Goals Unmet:  Not Applicable  Comments: First full day of exercise!  Patient was oriented to gym and equipment including functions, settings, policies, and procedures.  Patient's individual exercise prescription and treatment plan were reviewed.  All starting workloads were established based on the results of the 6 minute walk test done at initial orientation visit.  The plan for exercise progression was also introduced and progression will be customized  based on patient's performance and goals.    Dr. Bethann Punches is Medical Director for [facility] Cardiac Rehabilitation.  Dr. Vida Rigger is Medical Director for [facility] Pulmonary Rehabilitation.

## 2022-09-23 ENCOUNTER — Ambulatory Visit (INDEPENDENT_AMBULATORY_CARE_PROVIDER_SITE_OTHER): Payer: 59 | Admitting: Student in an Organized Health Care Education/Training Program

## 2022-09-23 ENCOUNTER — Telehealth: Payer: Self-pay

## 2022-09-23 ENCOUNTER — Encounter: Payer: Self-pay | Admitting: Student in an Organized Health Care Education/Training Program

## 2022-09-23 VITALS — BP 110/70 | HR 78 | Temp 97.6°F | Ht 68.0 in | Wt 109.0 lb

## 2022-09-23 DIAGNOSIS — R0602 Shortness of breath: Secondary | ICD-10-CM

## 2022-09-23 DIAGNOSIS — J432 Centrilobular emphysema: Secondary | ICD-10-CM | POA: Diagnosis not present

## 2022-09-23 DIAGNOSIS — Z87891 Personal history of nicotine dependence: Secondary | ICD-10-CM | POA: Diagnosis not present

## 2022-09-23 NOTE — Telephone Encounter (Signed)
Patient is requesting POC. It appears that she was started on oxygen during previous admission. Recent walk test at pulmonary rehab. According to test, she required 3L.  Dr. Aundria Rud, please advise if okay to order POC eval? [id number].

## 2022-09-23 NOTE — Progress Notes (Signed)
Synopsis: Referred in for shortness of breath by Marliss Coots, MD  Assessment & Plan:   1. Shortness of breath 2. Centrilobular emphysema (HCC) 3. Personal history of tobacco use, presenting hazards to health  Presents for follow up of shortness of breath in the setting of known emphysema secondary to long standing cigarette smoking. She continues to smoke two cigarettes daily. She is using Symbicort but not the tiotropium (spiriva respimat). Will provide repeat respimat training today. Patient encouraged to schedule he PFT's and LDCT prior to her follow up visit.   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 I reviewed her images and it appears she might have had a subtotal lobar resection (? Segmentectomty vs wedge resection). The very mild subpleural reticulation has been stable (appears worse on the coronary CT secondary to respiratory motion) and I will follow up on radiographic changes with her upcoming LDCT for lung cancer screening.  -Continue triple therapy with Symbicort and Spiriva -Will need PFT's, schedule today -Will need LDCT for lung cancer screening, schedule today -Continue with pulmonary rehab -smoking cessation advised   Return in about 3 months (around 12/23/2022).  I spent 30 minutes caring for this patient today, including preparing to see the patient, obtaining a medical history , reviewing a separately obtained history, performing a medically appropriate examination and/or evaluation, counseling and educating the patient/family/caregiver, ordering medications, tests, or procedures, and documenting clinical information in the electronic health record  Raechel Chute, MD [facility] 09/23/2022 12:15 PM    End of visit medications:  No orders of the defined types were placed in this encounter.    Current Outpatient Medications:    albuterol (PROVENTIL) (2.5 MG/3ML) 0.083% nebulizer solution,  Take 2.5 mg by nebulization 4 (four) times daily., Disp: , Rfl:    aspirin 81 MG chewable tablet, Chew 81 mg by mouth daily., Disp: , Rfl:    budesonide-formoterol (SYMBICORT) 160-4.5 MCG/ACT inhaler, Inhale 2 puffs into the lungs 2 (two) times daily., Disp: , Rfl:    buPROPion (WELLBUTRIN SR) 150 MG 12 hr tablet, Take 150 mg by mouth 2 (two) times daily., Disp: , Rfl:    clonazePAM (KLONOPIN) 1 MG tablet, Take 1 mg by mouth at bedtime., Disp: , Rfl:    cyanocobalamin (VITAMIN B12) 500 MCG tablet, Take 500 mcg by mouth daily., Disp: , Rfl:    ferrous sulfate 324 MG TBEC, Take 324 mg by mouth., Disp: , Rfl:    IPRATROPIUM BROMIDE HFA IN, Inhale 2 puffs into the lungs 4 (four) times daily., Disp: , Rfl:    isosorbide mononitrate (IMDUR) 30 MG 24 hr tablet, Take 0.5 tablets (15 mg total) by mouth daily. PLEASE CALL OFFICE TO SCHEDULE APPOINTMENT PRIOR TO NEXT REFILL, Disp: 45 tablet, Rfl: 0   levothyroxine (SYNTHROID) 50 MCG tablet, Take 50 mcg by mouth daily., Disp: , Rfl:    meloxicam (MOBIC) 15 MG tablet, Take 15 mg by mouth at bedtime., Disp: , Rfl:    naloxone (NARCAN) nasal spray 4 mg/0.1 mL, SMARTSIG:Both Nares, Disp: , Rfl:    nicotine (NICODERM CQ - DOSED IN MG/24 HOURS) 14 mg/24hr patch, Place 14 mg onto the skin daily., Disp: , Rfl:    nitrofurantoin, macrocrystal-monohydrate, (MACROBID) 100 MG capsule, Take 100 mg by mouth 2 (two) times daily., Disp: , Rfl:    nitroGLYCERIN (NITROSTAT) 0.3 MG SL tablet, Place 0.3 mg under the tongue every 5 (five) minutes as needed for chest pain., Disp: ,  Rfl:    omeprazole (PRILOSEC) 40 MG capsule, Take 40 mg by mouth 2 (two) times daily., Disp: , Rfl:    PARoxetine (PAXIL) 20 MG tablet, Take 20 mg by mouth at bedtime., Disp: , Rfl:    pravastatin (PRAVACHOL) 10 MG tablet, Take 10 mg by mouth daily., Disp: , Rfl:    QUEtiapine (SEROQUEL) 400 MG tablet, Take 400 mg by mouth at bedtime., Disp: , Rfl:    sucralfate (CARAFATE) 1 GM/10ML suspension, Take 2 g  by mouth 3 times/day as needed-between meals & bedtime., Disp: , Rfl:    SUMAtriptan (IMITREX) 100 MG tablet, Take 100 mg by mouth every 2 (two) hours as needed for migraine. May repeat in 2 hours if headache persists or recurs., Disp: , Rfl:    Tiotropium Bromide Monohydrate (SPIRIVA RESPIMAT) 2.5 MCG/ACT AERS, Inhale 2 puffs into the lungs daily., Disp: 4 g, Rfl: 11   traMADol (ULTRAM) 50 MG tablet, Take 50 mg by mouth 4 (four) times daily., Disp: , Rfl:    Vitamin D, Ergocalciferol, (DRISDOL) 1.25 MG (50000 UNIT) CAPS capsule, Take 50,000 Units by mouth once a week., Disp: , Rfl:    Subjective:   PATIENT ID: Tammy Maxwell: female DOB: 17-Jan-1960, MRN: [id number]  Chief Complaint  Patient presents with   Follow-up    Reports cough, shortness of breath and wheezing.    HPI  Patient is a pleasant 62 year old female presenting for the evaluation of shortness of breath.   Symptoms persist. Reports now knowing how to use Spiriva Respimat so didn't use it. Continues to use Symbicort and Albuterol. Symptoms are unchanged. She did start pulmonary rehab and feels improvement with it. She continues to smoke 2 cigarettes a day with mild improvement. PFT's and LDCT unfortunately not scheduled.  She reports symptoms that have been present since 1995. She reports having had surgery in 1995 where "a part of my lung was removed". She reports this was done because of recurrent collapse of the lung. She reports progressive dyspnea over the years, and was told she had COPD in the past. Patient's symptoms include a cough that is productive of brownish sputum at times, dyspnea with exertion (especially going up stairs), and wheezing. She doesn't report any recent COPD exacerbations requiring steroids.   Patient has been seen by cardiology for chest tightness and shortness of breath, and workup has included a coronary CTA and echocardiogram. She is enrolled in our lung cancer screening program with her  last CT being from July of 2023. She missed her LDCT in July of 2024 and did not undergo pulmonary function testing.   Patient previously lived in [place], [place]. She used to work as a Health visitor (plumbing, Lobbyist, Event organiser). Denies asbestos exposure. She used to be a heavy smoker and has since cut down to 2 cigarettes a day.  Ancillary information including prior medications, full medical/surgical/family/social histories, and PFTs (when available) are listed below and have been reviewed.   Review of Systems  Constitutional:  Positive for malaise/fatigue and weight loss. Negative for chills and fever.  Respiratory:  Positive for cough and shortness of breath. Negative for hemoptysis, sputum production and wheezing.   Cardiovascular:  Negative for chest pain and palpitations.     Objective:   Vitals:   09/23/22 1013  BP: 110/70  Pulse: 78  Temp: 97.6 F (36.4 C)  TempSrc: Temporal  SpO2: 98%  Weight: 109 lb (49.4 kg)  Height: 5\' 8"  (1.727 m)   98% on RA  BMI Readings from Last 3 Encounters:  09/23/22 16.57 kg/m  09/16/22 16.13 kg/m  07/23/22 16.36 kg/m   Wt Readings from Last 3 Encounters:  09/23/22 109 lb (49.4 kg)  09/16/22 106 lb 1.6 oz (48.1 kg)  07/23/22 109 lb 3.2 oz (49.5 kg)    Physical Exam Constitutional:      Appearance: Normal appearance. She is not ill-appearing.  Cardiovascular:     Rate and Rhythm: Normal rate and regular rhythm.     Pulses: Normal pulses.     Heart sounds: Normal heart sounds.  Pulmonary:     Breath sounds: No wheezing.     Comments: Decreased air entry in the bilateral lung field Abdominal:     Palpations: Abdomen is soft.  Neurological:     General: No focal deficit present.     Mental Status: She is alert and oriented to person, place, and time. Mental status is at baseline.       Ancillary Information    Past Medical History:  Diagnosis Date   Anemia    Arthritis    Bronchitis    Chest pain    COPD  (chronic obstructive pulmonary disease) (HCC)    Depression    Dysrhythmia    GERD (gastroesophageal reflux disease)    Headache    Heart murmur    History of hiatal hernia    Pneumonia    Pneumothorax    Wears dentures    full upper     Family History  Problem Relation Age of Onset   Stroke Mother    Hypertension Mother    Diabetes Mother    Clotting disorder Mother    Cancer Mother    Anesthesia problems Mother    Stroke Father    Hypertension Father    Diabetes Father    Cancer Father    Clotting disorder Father    Anesthesia problems Father    Hypertension Sister    Diabetes Sister    Hypertension Brother    Parkinson's disease Maternal Grandfather      Past Surgical History:  Procedure Laterality Date   APPENDECTOMY     CATARACT EXTRACTION W/PHACO Right 08/19/2019   Procedure: CATARACT EXTRACTION PHACO AND INTRAOCULAR LENS PLACEMENT (IOC) RIGHT;  Surgeon: Nevada Crane, MD;  Location: [facility] ORS;  Service: Ophthalmology;  Laterality: Right;  [place] 00:14.5 CDE 0.90 Fluid Pack lot # [id number]   CATARACT EXTRACTION W/PHACO Left 09/20/2019   Procedure: CATARACT EXTRACTION PHACO AND INTRAOCULAR LENS PLACEMENT (IOC) LEFT 1.27  00:13.7;  Surgeon: Nevada Crane, MD;  Location: [facility];  Service: Ophthalmology;  Laterality: Left;   COLONOSCOPY     COLONOSCOPY WITH PROPOFOL N/A 02/15/2020   Procedure: COLONOSCOPY WITH PROPOFOL;  Surgeon: Wyline Mood, MD;  Location: [facility] ENDOSCOPY;  Service: Gastroenterology;  Laterality: N/A;   ESOPHAGOGASTRODUODENOSCOPY (EGD) WITH PROPOFOL N/A 02/15/2020   Procedure: ESOPHAGOGASTRODUODENOSCOPY (EGD) WITH PROPOFOL;  Surgeon: Wyline Mood, MD;  Location: [facility] ENDOSCOPY;  Service: Gastroenterology;  Laterality: N/A;   LUNG LOBECTOMY Left    OOPHORECTOMY     ROTATOR CUFF REPAIR     SHOULDER ARTHROSCOPY Right     Social History   Socioeconomic History   Marital status: Single    Spouse name: Not on file   Number of children:  Not on file   Years of education: Not on file   Highest education level: Not on file  Occupational History   Not on file  Tobacco Use   Smoking status: Every  Day    Current packs/day: 0.50    Average packs/day: 0.5 packs/day for 47.0 years (23.5 ttl pk-yrs)    Types: Cigarettes   Smokeless tobacco: Never   Tobacco comments:    2 cigs daily- 07/23/2022  Vaping Use   Vaping status: Never Used  Substance and Sexual Activity   Alcohol use: Not Currently   Drug use: Not Currently   Sexual activity: Not Currently  Other Topics Concern   Not on file  Social History Narrative   Not on file   Social Determinants of Health   Financial Resource Strain: Not on file  Food Insecurity: Food Insecurity Present (03/18/2022)   Hunger Vital Sign    Worried About Running Out of Food in the Last Year: Sometimes true    Ran Out of Food in the Last Year: Sometimes true  Transportation Needs: No Transportation Needs (03/18/2022)   PRAPARE - Administrator, Civil Service (Medical): No    Lack of Transportation (Non-Medical): No  Physical Activity: Not on file  Stress: Not on file  Social Connections: Unknown (03/18/2022)   Social Connection and Isolation Panel [NHANES]    Frequency of Communication with Friends and Family: Patient declined    Frequency of Social Gatherings with Friends and Family: Patient declined    Attends Religious Services: Patient declined    Active Member of Clubs or Organizations: Not on file    Attends Banker Meetings: Patient declined    Marital Status: Patient declined  Intimate Partner Violence: Not At Risk (03/18/2022)   Humiliation, Afraid, Rape, and Kick questionnaire    Fear of Current or Ex-Partner: No    Emotionally Abused: No    Physically Abused: No    Sexually Abused: No     Allergies  Allergen Reactions   Codeine Anaphylaxis   Penicillins Anaphylaxis     CBC    Component Value Date/Time   WBC 5.9 07/16/2022 1248   RBC  4.98 07/16/2022 1248   HGB 13.7 07/16/2022 1248   HCT 42.5 07/16/2022 1248   PLT 144 (L) 07/16/2022 1248   MCV 85.3 07/16/2022 1248   MCH 27.5 07/16/2022 1248   MCHC 32.2 07/16/2022 1248   RDW 19.3 (H) 07/16/2022 1248   LYMPHSABS 2.2 07/16/2022 1248   MONOABS 0.4 07/16/2022 1248   EOSABS 0.1 07/16/2022 1248   BASOSABS 0.1 07/16/2022 1248    Pulmonary Functions Testing Results:     No data to display          Outpatient Medications Prior to Visit  Medication Sig Dispense Refill   albuterol (PROVENTIL) (2.5 MG/3ML) 0.083% nebulizer solution Take 2.5 mg by nebulization 4 (four) times daily.     aspirin 81 MG chewable tablet Chew 81 mg by mouth daily.     budesonide-formoterol (SYMBICORT) 160-4.5 MCG/ACT inhaler Inhale 2 puffs into the lungs 2 (two) times daily.     buPROPion (WELLBUTRIN SR) 150 MG 12 hr tablet Take 150 mg by mouth 2 (two) times daily.     clonazePAM (KLONOPIN) 1 MG tablet Take 1 mg by mouth at bedtime.     cyanocobalamin (VITAMIN B12) 500 MCG tablet Take 500 mcg by mouth daily.     ferrous sulfate 324 MG TBEC Take 324 mg by mouth.     IPRATROPIUM BROMIDE HFA IN Inhale 2 puffs into the lungs 4 (four) times daily.     isosorbide mononitrate (IMDUR) 30 MG 24 hr tablet Take 0.5 tablets (15 mg total) by  mouth daily. PLEASE CALL OFFICE TO SCHEDULE APPOINTMENT PRIOR TO NEXT REFILL 45 tablet 0   levothyroxine (SYNTHROID) 50 MCG tablet Take 50 mcg by mouth daily.     meloxicam (MOBIC) 15 MG tablet Take 15 mg by mouth at bedtime.     naloxone (NARCAN) nasal spray 4 mg/0.1 mL SMARTSIG:Both Nares     nicotine (NICODERM CQ - DOSED IN MG/24 HOURS) 14 mg/24hr patch Place 14 mg onto the skin daily.     nitrofurantoin, macrocrystal-monohydrate, (MACROBID) 100 MG capsule Take 100 mg by mouth 2 (two) times daily.     nitroGLYCERIN (NITROSTAT) 0.3 MG SL tablet Place 0.3 mg under the tongue every 5 (five) minutes as needed for chest pain.     omeprazole (PRILOSEC) 40 MG capsule Take  40 mg by mouth 2 (two) times daily.     PARoxetine (PAXIL) 20 MG tablet Take 20 mg by mouth at bedtime.     pravastatin (PRAVACHOL) 10 MG tablet Take 10 mg by mouth daily.     QUEtiapine (SEROQUEL) 400 MG tablet Take 400 mg by mouth at bedtime.     sucralfate (CARAFATE) 1 GM/10ML suspension Take 2 g by mouth 3 times/day as needed-between meals & bedtime.     SUMAtriptan (IMITREX) 100 MG tablet Take 100 mg by mouth every 2 (two) hours as needed for migraine. May repeat in 2 hours if headache persists or recurs.     Tiotropium Bromide Monohydrate (SPIRIVA RESPIMAT) 2.5 MCG/ACT AERS Inhale 2 puffs into the lungs daily. 4 g 11   traMADol (ULTRAM) 50 MG tablet Take 50 mg by mouth 4 (four) times daily.     Vitamin D, Ergocalciferol, (DRISDOL) 1.25 MG (50000 UNIT) CAPS capsule Take 50,000 Units by mouth once a week.     No facility-administered medications prior to visit.

## 2022-09-23 NOTE — Telephone Encounter (Signed)
Lm for patient.  Per Dr. Aundria Rud verbally-- recent SMW does not have were patient desat on room air.  She will need to come in for walk test.

## 2022-09-24 ENCOUNTER — Encounter: Payer: 59 | Admitting: *Deleted

## 2022-09-24 ENCOUNTER — Ambulatory Visit: Payer: 59

## 2022-09-24 DIAGNOSIS — J439 Emphysema, unspecified: Secondary | ICD-10-CM | POA: Diagnosis not present

## 2022-09-24 NOTE — Telephone Encounter (Addendum)
Patient is aware of below message and voiced her understanding.  Patient scheduled for walk test today at 10:00. Nothing further needed.

## 2022-09-24 NOTE — Telephone Encounter (Signed)
Patient did not show for walk test. I called patient and she stated that she had planned to come after pulm rehab, however when she got to pulm rehab, she was told that she did not have an appt. She would like to come in for walk test 09/23 after scheduled CT. Appt scheduled 09/30/2022 at 10:30. Nothing further needed.

## 2022-09-24 NOTE — Telephone Encounter (Signed)
Lm x2 for patient.  Will close encounter per office protocol.

## 2022-09-24 NOTE — Progress Notes (Signed)
Daily Session Note  Patient Details  Name: Tammy Maxwell MRN: [id number] Date of Birth: May 04, 1960 Referring Provider:   Flowsheet Row Pulmonary Rehab from 09/16/2022 in [facility] Cardiac and Pulmonary Rehab  Referring Provider Dr. Raechel Chute, MD       Encounter Date: 09/24/2022  Check In:  Session Check In - 09/24/22 0945       Check-In   Supervising physician immediately available to respond to emergencies See telemetry face sheet for immediately available ER MD    Location [facility]-Cardiac & Pulmonary Rehab    Staff Present Cora Collum, RN, BSN, CCRP;Noah Tickle, BS, Exercise Physiologist;Maxon Conetta BS, , Exercise Physiologist    Virtual Visit No    Medication changes reported     No    Fall or balance concerns reported    No    Warm-up and Cool-down Performed on first and last piece of equipment    Resistance Training Performed Yes    VAD Patient? No    PAD/SET Patient? No      Pain Assessment   Currently in Pain? No/denies                Social History   Tobacco Use  Smoking Status Every Day   Current packs/day: 0.50   Average packs/day: 0.5 packs/day for 47.0 years (23.5 ttl pk-yrs)   Types: Cigarettes  Smokeless Tobacco Never  Tobacco Comments   2 cigs daily- 07/23/2022    Goals Met:  Proper associated with RPD/PD & O2 Sat Independence with exercise equipment Exercise tolerated well No report of concerns or symptoms today  Goals Unmet:  Not Applicable  Comments: Pt able to follow exercise prescription today without complaint.  Will continue to monitor for progression.    Dr. Bethann Punches is Medical Director for [facility] Cardiac Rehabilitation.  Dr. Vida Rigger is Medical Director for [facility] Pulmonary Rehabilitation.

## 2022-09-26 ENCOUNTER — Ambulatory Visit: Payer: 59 | Attending: Student in an Organized Health Care Education/Training Program

## 2022-09-26 ENCOUNTER — Encounter: Payer: 59 | Admitting: *Deleted

## 2022-09-30 ENCOUNTER — Telehealth: Payer: Self-pay

## 2022-09-30 ENCOUNTER — Ambulatory Visit: Payer: 59

## 2022-09-30 ENCOUNTER — Ambulatory Visit
Admission: RE | Admit: 2022-09-30 | Discharge: 2022-09-30 | Disposition: A | Discharge status: Home / Self Care | Payer: 59 | Source: Ambulatory Visit | Attending: *Deleted | Admitting: *Deleted

## 2022-09-30 DIAGNOSIS — R0602 Shortness of breath: Secondary | ICD-10-CM

## 2022-09-30 DIAGNOSIS — Z122 Encounter for screening for malignant neoplasm of respiratory organs: Secondary | ICD-10-CM | POA: Insufficient documentation

## 2022-09-30 DIAGNOSIS — F17210 Nicotine dependence, cigarettes, uncomplicated: Secondary | ICD-10-CM | POA: Insufficient documentation

## 2022-09-30 DIAGNOSIS — Z87891 Personal history of nicotine dependence: Secondary | ICD-10-CM

## 2022-09-30 NOTE — Telephone Encounter (Signed)
Patient in office for walk test. Completed test at moderate pace with mild SOB. Lowest spO2 94%

## 2022-09-30 NOTE — Progress Notes (Signed)
Patient in office for walk test.

## 2022-10-01 ENCOUNTER — Encounter: Payer: 59 | Admitting: *Deleted

## 2022-10-03 ENCOUNTER — Encounter: Payer: 59 | Admitting: *Deleted

## 2022-10-03 DIAGNOSIS — J439 Emphysema, unspecified: Secondary | ICD-10-CM | POA: Diagnosis not present

## 2022-10-03 NOTE — Progress Notes (Signed)
Daily Session Note  Patient Details  Name: Tammy Maxwell MRN: [id number] Date of Birth: May 23, 1960 Referring Provider:   Flowsheet Row Pulmonary Rehab from 09/16/2022 in [facility] Cardiac and Pulmonary Rehab  Referring Provider Dr. Raechel Chute, MD       Encounter Date: 10/03/2022  Check In:  Session Check In - 10/03/22 1038       Check-In   Supervising physician immediately available to respond to emergencies See telemetry face sheet for immediately available ER MD    Staff Present Susann Givens, RN BSN;Laureen Manson Passey, BS, RRT, CPFT;Maxon Conetta BS, , Exercise Physiologist;Westlee Devita Katrinka Blazing, RN, ADN    Virtual Visit No    Medication changes reported     No    Fall or balance concerns reported    No    Tobacco Cessation No Change    Warm-up and Cool-down Performed on first and last piece of equipment    Resistance Training Performed Yes    VAD Patient? No    PAD/SET Patient? No      Pain Assessment   Currently in Pain? No/denies                Social History   Tobacco Use  Smoking Status Every Day   Current packs/day: 0.50   Average packs/day: 0.5 packs/day for 47.0 years (23.5 ttl pk-yrs)   Types: Cigarettes  Smokeless Tobacco Never  Tobacco Comments   2 cigs daily- 07/23/2022    Goals Met:  Independence with exercise equipment Exercise tolerated well No report of concerns or symptoms today Strength training completed today  Goals Unmet:  Not Applicable  Comments: Pt able to follow exercise prescription today without complaint.  Will continue to monitor for progression.    Dr. Bethann Punches is Medical Director for [facility] Cardiac Rehabilitation.  Dr. Vida Rigger is Medical Director for [facility] Pulmonary Rehabilitation.

## 2022-10-08 ENCOUNTER — Encounter: Payer: 59 | Attending: Student in an Organized Health Care Education/Training Program | Admitting: *Deleted

## 2022-10-08 DIAGNOSIS — J439 Emphysema, unspecified: Secondary | ICD-10-CM | POA: Diagnosis present

## 2022-10-08 NOTE — Progress Notes (Signed)
Daily Session Note  Patient Details  Name: Tammy Maxwell MRN: [id number] Date of Birth: 05/11/1960 Referring Provider:   Flowsheet Row Pulmonary Rehab from 09/16/2022 in [facility] Cardiac and Pulmonary Rehab  Referring Provider Dr. Raechel Chute, MD       Encounter Date: 10/08/2022  Check In:  Session Check In - 10/08/22 1103       Check-In   Supervising physician immediately available to respond to emergencies See telemetry face sheet for immediately available ER MD    Location [facility]-Cardiac & Pulmonary Rehab    Staff Present Cora Collum, RN, BSN, CCRP;Meredith Jewel Baize, RN BSN;Noah Tickle, BS, Exercise Physiologist;Maxon Conetta BS, , Exercise Physiologist    Virtual Visit No    Medication changes reported     No    Fall or balance concerns reported    No    Warm-up and Cool-down Performed on first and last piece of equipment    Resistance Training Performed Yes    VAD Patient? No    PAD/SET Patient? No      Pain Assessment   Currently in Pain? No/denies                Social History   Tobacco Use  Smoking Status Every Day   Current packs/day: 0.50   Average packs/day: 0.5 packs/day for 47.0 years (23.5 ttl pk-yrs)   Types: Cigarettes  Smokeless Tobacco Never  Tobacco Comments   2 cigs daily- 07/23/2022    Goals Met:  Proper associated with RPD/PD & O2 Sat Independence with exercise equipment Exercise tolerated well No report of concerns or symptoms today  Goals Unmet:  Not Applicable  Comments: Pt able to follow exercise prescription today without complaint.  Will continue to monitor for progression.    Dr. Bethann Punches is Medical Director for [facility] Cardiac Rehabilitation.  Dr. Vida Rigger is Medical Director for [facility] Pulmonary Rehabilitation.

## 2022-10-09 ENCOUNTER — Encounter: Payer: Self-pay | Admitting: *Deleted

## 2022-10-09 DIAGNOSIS — J439 Emphysema, unspecified: Secondary | ICD-10-CM

## 2022-10-09 NOTE — Progress Notes (Signed)
Pulmonary Individual Treatment Plan  Patient Details  Name: Tammy Maxwell MRN: [id number] Date of Birth: [birth year] Referring Provider:   Flowsheet Row Pulmonary Rehab from 09/16/2022 in [facility] Re[facility]  Referring Provider Dr. Raechel Chute, MD       Initial Encounter Date:  Flowsheet Row Pulmonary Rehab from 09/16/2022 in [facility] Cardiac and Pulmonary Rehab  Date 09/16/22       Visit Diagnosis: Pulmonary emphysema, unspecified emphysema type (HCC)  Patient's Home Medications on Admission:  Current Outpatient Medications:    albuterol (PROVENTIL) (2.5 MG/3ML) 0.083% nebulizer solution, Take 2.5 mg by nebulization 4 (four) times daily., Disp: , Rfl:    aspirin 81 MG chewable tablet, Chew 81 mg by mouth daily., Disp: , Rfl:    budesonide-formoterol (SYMBICORT) 160-4.5 MCG/ACT inhaler, Inhale 2 puffs into the lungs 2 (two) times daily., Disp: , Rfl:    buPROPion (WELLBUTRIN SR) 150 MG 12 hr tablet, Take 150 mg by mouth 2 (two) times daily., Disp: , Rfl:    clonazePAM (KLONOPIN) 1 MG tablet, Take 1 mg by mouth at bedtime., Disp: , Rfl:    cyanocobalamin (VITAMIN B12) 500 MCG tablet, Take 500 mcg by mouth daily., Disp: , Rfl:    ferrous sulfate 324 MG TBEC, Take 324 mg by mouth., Disp: , Rfl:    IPRATROPIUM BROMIDE HFA IN, Inhale 2 puffs into the lungs 4 (four) times daily., Disp: , Rfl:    isosorbide mononitrate (IMDUR) 30 MG 24 hr tablet, Take 0.5 tablets (15 mg total) by mouth daily. PLEASE CALL OFFICE TO SCHEDULE APPOINTMENT PRIOR TO NEXT REFILL, Disp: 45 tablet, Rfl: 0   levothyroxine (SYNTHROID) 50 MCG tablet, Take 50 mcg by mouth daily., Disp: , Rfl:    meloxicam (MOBIC) 15 MG tablet, Take 15 mg by mouth at bedtime., Disp: , Rfl:    naloxone (NARCAN) nasal spray 4 mg/0.1 mL, SMARTSIG:Both Nares, Disp: , Rfl:    nicotine (NICODERM CQ - DOSED IN MG/24 HOURS) 14 mg/24hr patch, Place 14 mg onto the skin daily., Disp: , Rfl:    nitrofurantoin, macrocrystal-monohydrate,  (MACROBID) 100 MG capsule, Take 100 mg by mouth 2 (two) times daily., Disp: , Rfl:    nitroGLYCERIN (NITROSTAT) 0.3 MG SL tablet, Place 0.3 mg under the tongue every 5 (five) minutes as needed for chest pain., Disp: , Rfl:    omeprazole (PRILOSEC) 40 MG capsule, Take 40 mg by mouth 2 (two) times daily., Disp: , Rfl:    PARoxetine (PAXIL) 20 MG tablet, Take 20 mg by mouth at bedtime., Disp: , Rfl:    pravastatin (PRAVACHOL) 10 MG tablet, Take 10 mg by mouth daily., Disp: , Rfl:    QUEtiapine (SEROQUEL) 400 MG tablet, Take 400 mg by mouth at bedtime., Disp: , Rfl:    sucralfate (CARAFATE) 1 GM/10ML suspension, Take 2 g by mouth 3 times/day as needed-between meals & bedtime., Disp: , Rfl:    SUMAtriptan (IMITREX) 100 MG tablet, Take 100 mg by mouth every 2 (two) hours as needed for migraine. May repeat in 2 hours if headache persists or recurs., Disp: , Rfl:    Tiotropium Bromide Monohydrate (SPIRIVA RESPIMAT) 2.5 MCG/ACT AERS, Inhale 2 puffs into the lungs daily., Disp: 4 g, Rfl: 11   traMADol (ULTRAM) 50 MG tablet, Take 50 mg by mouth 4 (four) times daily., Disp: , Rfl:    Vitamin D, Ergocalciferol, (DRISDOL) 1.25 MG (50000 UNIT) CAPS capsule, Take 50,000 Units by mouth once a week., Disp: , Rfl:   Past  Medical History: Past Medical History:  Diagnosis Date   Anemia    Arthritis    Bronchitis    Chest pain    COPD (chronic obstructive pulmonary disease) (HCC)    Depression    Dysrhythmia    GERD (gastroesophageal reflux disease)    Headache    Heart murmur    History of hiatal hernia    Pneumonia    Pneumothorax    Wears dentures    full upper    Tobacco Use: Social History   Tobacco Use  Smoking Status Every Day   Current packs/day: 0.50   Average packs/day: 0.5 packs/day for 47.0 years (23.5 ttl pk-yrs)   Types: Cigarettes  Smokeless Tobacco Never  Tobacco Comments   2 cigs daily- 07/23/2022    Labs: Review Flowsheet       Latest Ref Rng & Units 02/13/2022  Labs for  [facility] Cardiac and Pulmonary Rehab  Cholestrol 0 - 200 mg/dL 960   LDL (calc) 0 - 99 mg/dL 97   HDL-C >45 mg/dL 73   Trlycerides <409 mg/dL 87     Details             Pulmonary Assessment Scores:  Pulmonary Assessment Scores     Row Name 09/16/22 1336         ADL UCSD   ADL Phase Entry     SOB Score total 82     Rest 3     Walk 4     Stairs 5     Bath 4     Dress 3     Shop 0       CAT Score   CAT Score 36       mMRC Score   mMRC Score 3              UCSD: Self-administered rating of dyspnea associated with activities of daily living (ADLs) 6-point scale (0 = "not at all" to 5 = "maximal or unable to do because of breathlessness")  Scoring Scores range from 0 to 120.  Minimally important difference is 5 units  CAT: CAT can identify the health impairment of COPD patients and is better correlated with disease progression.  CAT has a scoring range of zero to 40. The CAT score is classified into four groups of low (less than 10), medium (10 - 20), high (21-30) and very high (31-40) based on the impact level of disease on health status. A CAT score over 10 suggests significant symptoms.  A worsening CAT score could be explained by an exacerbation, poor medication adherence, poor inhaler technique, or progression of COPD or comorbid conditions.  CAT MCID is 2 points  mMRC: mMRC (Modified Medical Research Council) Dyspnea Scale is used to assess the degree of baseline functional disability in patients of respiratory disease due to dyspnea. No minimal important difference is established. A decrease in score of 1 point or greater is considered a positive change.   Pulmonary Function Assessment:   Exercise Target Goals: Exercise Program Goal: Individual exercise prescription set using results from initial 6 min walk test and THRR while considering  patient's activity barriers and safety.   Exercise Prescription Goal: Initial exercise prescription builds to 30-45  minutes a day of aerobic activity, 2-3 days per week.  Home exercise guidelines will be given to patient during program as part of exercise prescription that the participant will acknowledge.  Education: Aerobic Exercise: - Group verbal and visual presentation on the components of  exercise prescription. Introduces F.I.T.T principle from ACSM for exercise prescriptions.  Reviews F.I.T.T. principles of aerobic exercise including progression. Written material given at graduation. Flowsheet Row Pulmonary Rehab from 10/03/2022 in [facility] Card[facility] Pulmonary Rehab  Education need identified 09/16/22       Education: Resistance Exercise: - Group verbal and visual presentation on the components of exercise prescription. Introduces F.I.T.T principle from ACSM for exercise prescriptions  Reviews F.I.T.T. principles of resistance exercise including progression. Written material given at graduation.    Education: Exercise & Equipment Safety: - Individual verbal instruction and demonstration of equipment use and safety with use of the equipment. Flowsheet Row Pulmonary Rehab from 10/03/2022 in [facility] Cardiac and Pulmonary Rehab  Date 09/16/22  Educator NT  Instruction Review Code 1- Verbalizes Understanding       Education: Exercise Physiology & General Exercise Guidelines: - Group verbal and written instruction with models to review the exercise physiology of the cardiovascular system and associated critical values. Provides general exercise guidelines with specific guidelines to those with heart or lung disease.    Education: Flexibility, Balance, Mind/Body Relaxation: - Group verbal and visual presentation with interactive activity on the components of exercise prescription. Introduces F.I.T.T principle from ACSM for exercise prescriptions. Reviews F.I.T.T. principles of flexibility and balance exercise training including progression. Also discusses the mind body connection.  Reviews various relaxation  techniques to help reduce and manage stress (i.e. Deep breathing, progressive muscle relaxation, and visualization). Balance handout provided to take home. Written material given at graduation.   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 HR 107 bpm     Max Ex. BP 124/72     2 Minute Post BP 112/68       Interval HR   1 Minute HR 101     2 Minute HR 105     3 Minute HR 106     4 Minute HR 106     5 Minute HR 107     6 Minute HR 106     2 Minute Post HR 98     Interval Heart Rate? Yes       Interval Oxygen   Interval Oxygen? Yes     Baseline Oxygen Saturation % 99 %     1 Minute Oxygen Saturation % 99 %     1 Minute Liters of  Oxygen 3 L     2 Minute Oxygen Saturation % 97 %     2 Minute Liters of Oxygen 3 L     3 Minute Oxygen Saturation % 97 %     3 Minute Liters of Oxygen 3 L     4 Minute Oxygen Saturation % 98 %     4 Minute Liters of Oxygen 3 L     5 Minute Oxygen Saturation % 97 %     5 Minute Liters of Oxygen 3 L     6 Minute Oxygen Saturation % 94 %     6 Minute Liters of Oxygen 3 L     2 Minute Post Oxygen Saturation % 95 %     2 Minute Post Liters of Oxygen 3 L             Oxygen Initial Assessment:  Oxygen Initial Assessment -  08/26/22 1552       Home Oxygen   Home Oxygen Device Home Concentrator;E-Tanks    Sleep Oxygen Prescription Continuous    Liters per minute 2.5    Home Exercise Oxygen Prescription Pulsed    Liters per minute 2.5    Home Resting Oxygen Prescription Pulsed    Liters per minute 2.5    Compliance with Home Oxygen Use Yes      Intervention   Short Term Goals To learn and exhibit compliance with exercise, home and travel O2 prescription;To learn and understand importance of maintaining oxygen saturations>88%;To learn and demonstrate proper use of respiratory medications;To learn and understand importance of monitoring SPO2 with pulse oximeter and demonstrate accurate use of the pulse oximeter.;To learn and demonstrate proper pursed lip breathing techniques or other breathing techniques.     Long  Term Goals Exhibits compliance with exercise, home  and travel O2 prescription;Maintenance of O2 saturations>88%;Compliance with respiratory medication;Verbalizes importance of monitoring SPO2 with pulse oximeter and return demonstration;Exhibits proper breathing techniques, such as pursed lip breathing or other method taught during program session             Oxygen Re-Evaluation:  Oxygen Re-Evaluation     Row Name 09/19/22 1009             Goals/Expected Outcomes   Comments Reviewed PLB technique with pt.  Talked about how it works and it's importance in maintaining their exercise saturations.       Goals/Expected Outcomes Short: Become more profiecient at using PLB.   Long: Become independent at using PLB.                Oxygen Discharge (Final Oxygen Re-Evaluation):  Oxygen Re-Evaluation - 09/19/22 1009       Goals/Expected Outcomes   Comments Reviewed PLB technique with pt.  Talked about how it works and it's importance in maintaining their exercise saturations.    Goals/Expected Outcomes Short: Become more profiecient at using PLB.   Long: Become independent at using PLB.              Initial Exercise Prescription:  Initial Exercise Prescription - 09/16/22 1300       Date of Initial Exercise RX and Referring Provider   Date 09/16/22    Referring Provider Dr. Raechel Chute, MD      Oxygen   Oxygen Continuous    Liters 3    Maintain Oxygen Saturation 88% or higher      Treadmill   MPH 1.8    Grade 0.5    Minutes 15  METs 2.5      Recumbant Bike   Level 2    RPM 50    Watts 25    Minutes 15    METs 3.66      NuStep   Level 2    SPM 80    Minutes 15    METs 3.66      Prescription Details   Frequency (times per week) 2    Duration Progress to 30 minutes of continuous aerobic without signs/symptoms of physical distress      Intensity   THRR 40-80% of Max Heartrate 109-143    Ratings of Perceived Exertion 11-13    Perceived Dyspnea 0-4      Progression   Progression Continue to progress workloads to maintain intensity without signs/symptoms of physical distress.      Resistance Training   Training Prescription Yes    Weight 3 lb    Reps 10-15             Perform Capillary Blood Glucose checks as needed.  Exercise Prescription Changes:   Exercise Prescription Changes     Row Name 09/16/22 1300 09/24/22 1400           Response to Exercise   Blood Pressure (Admit) 116/70 100/62      Blood Pressure (Exercise) 124/72 108/62      Blood Pressure (Exit) 112/68 98/62      Heart Rate (Admit) 75 bpm 75 bpm      Heart Rate (Exercise) 107 bpm 92 bpm      Heart Rate (Exit) 98 bpm 80 bpm      Oxygen Saturation (Admit) 99 % 97 %      Oxygen Saturation (Exercise) 94 % 97 %      Oxygen Saturation (Exit) 95 % 98 %      Rating of Perceived Exertion (Exercise) 9 9      Perceived Dyspnea (Exercise) 1 1      Symptoms Fatigue none      Comments Results --      Duration -- Progress to 30 minutes of  aerobic without signs/symptoms of physical distress      Intensity -- THRR unchanged        Progression   Progression --  Continue to progress workloads to maintain intensity without signs/symptoms of physical distress.      Average METs -- 2.5        Resistance Training   Training Prescription -- Yes      Weight -- 3 lb      Reps -- 10-15        Interval Training   Interval Training -- No        Oxygen   Oxygen -- Continuous      Liters -- 3        Recumbant Bike   Level -- 1      Watts -- 12      Minutes -- 15      METs -- 2.5        NuStep   Level -- 1      Minutes -- 15      METs -- 2.5        Oxygen   Maintain Oxygen Saturation -- 88% or higher               Exercise Comments:   Exercise Comments     Row Name 09/19/22 1008  Exercise Comments First full day of exercise!  Patient was oriented to gym and equipment including functions, settings, policies, and procedures.  Patient's individual exercise prescription and treatment plan were reviewed.  All starting workloads were established based on the results of the 6 minute walk test done at initial orientation visit.  The plan for exercise progression was also introduced and progression will be customized based on patient's performance and goals.                Exercise Goals and Review:   Exercise Goals     Row Name 09/16/22 1342             Exercise Goals   Increase Physical Activity Yes       Intervention Provide advice, education, support and counseling about physical activity/exercise needs.;Develop an individualized exercise prescription for aerobic and resistive training based on initial evaluation findings, risk stratification, comorbidities and participant's personal goals.       Expected Outcomes Short Term: Attend rehab on a regular basis to increase amount of physical activity.;Long Term: Add in home exercise to make exercise part of routine and to increase amount of physical activity.;Long Term: Exercising regularly at least 3-5 days a week.       Increase Strength and Stamina Yes        Intervention Provide advice, education, support and counseling about physical activity/exercise needs.;Develop an individualized exercise prescription for aerobic and resistive training based on initial evaluation findings, risk stratification, comorbidities and participant's personal goals.       Expected Outcomes Short Term: Increase workloads from initial exercise prescription for resistance, speed, and METs.;Short Term: Perform resistance training exercises routinely during rehab and add in resistance training at home;Long Term: Improve cardiorespiratory fitness, muscular endurance and strength as measured by increased METs and functional capacity ( )       Able to understand and use rate of perceived exertion (RPE) scale Yes       Intervention Provide education and explanation on how to use RPE scale       Expected Outcomes Short Term: Able to use RPE daily in rehab to express subjective intensity level;Long Term:  Able to use RPE to guide intensity level when exercising independently       Able to understand and use Dyspnea scale Yes       Intervention Provide education and explanation on how to use Dyspnea scale       Expected Outcomes Short Term: Able to use Dyspnea scale daily in rehab to express subjective sense of shortness of breath during exertion;Long Term: Able to use Dyspnea scale to guide intensity level when exercising independently       Knowledge and understanding of Target Heart Rate Range (THRR) Yes       Intervention Provide education and explanation of THRR including how the numbers were predicted and where they are located for reference       Expected Outcomes Short Term: Able to state/look up THRR;Long Term: Able to use THRR to govern intensity when exercising independently;Short Term: Able to use daily as guideline for intensity in rehab       Able to check pulse independently Yes       Intervention Provide education and demonstration on how to check pulse in carotid and  radial arteries.;Review the importance of being able to check your own pulse for safety during independent exercise       Expected Outcomes Short Term: Able to explain why pulse checking is  important during independent exercise;Long Term: Able to check pulse independently and accurately       Understanding of Exercise Prescription Yes       Intervention Provide education, explanation, and written materials on patient's individual exercise prescription       Expected Outcomes Short Term: Able to explain program exercise prescription;Long Term: Able to explain home exercise prescription to exercise independently                Exercise Goals Re-Evaluation :  Exercise Goals Re-Evaluation     Row Name 09/19/22 1008 09/24/22 1437           Exercise Goal Re-Evaluation   Exercise Goals Review Increase Physical Activity;Able to understand and use rate of perceived exertion (RPE) scale;Knowledge and understanding of Target Heart Rate Range (THRR);Understanding of Exercise Prescription;Increase Strength and Stamina;Able to check pulse independently;Able to understand and use Dyspnea scale Increase Physical Activity;Increase Strength and Stamina;Understanding of Exercise Prescription      Comments Reviewed RPE  and dyspnea scale, THR and program prescription with pt today.  Pt voiced understanding and was given a copy of goals to take home. Hope is off to a good start in the program. She attended her first and only appointment during this review. During the session she used the T4 nustep and recumbent bike at level 1. We will continue to monitor her progress in the program.      Expected Outcomes Short: Use RPE daily to regulate intensity.  Long: Follow program prescription in THR. Short: Continue to follow current exercise prescription. Long: Continue exercise to improve strength and stamina.               Discharge Exercise Prescription (Final Exercise Prescription Changes):  Exercise  Prescription Changes - 09/24/22 1400       Response to Exercise   Blood Pressure (Admit) 100/62    Blood Pressure (Exercise) 108/62    Blood Pressure (Exit) 98/62    Heart Rate (Admit) 75 bpm    Heart Rate (Exercise) 92 bpm    Heart Rate (Exit) 80 bpm    Oxygen Saturation (Admit) 97 %    Oxygen Saturation (Exercise) 97 %    Oxygen Saturation (Exit) 98 %    Rating of Perceived Exertion (Exercise) 9    Perceived Dyspnea (Exercise) 1    Symptoms none    Duration Progress to 30 minutes of  aerobic without signs/symptoms of physical distress    Intensity THRR unchanged      Progression   Progression Continue to progress workloads to maintain intensity without signs/symptoms of physical distress.    Average METs 2.5      Resistance Training   Training Prescription Yes    Weight 3 lb    Reps 10-15      Interval Training   Interval Training No      Oxygen   Oxygen Continuous    Liters 3      Recumbant Bike   Level 1    Watts 12    Minutes 15    METs 2.5      NuStep   Level 1    Minutes 15    METs 2.5      Oxygen   Maintain Oxygen Saturation 88% or higher             Nutrition:  Target Goals: Understanding of nutrition guidelines, daily intake of sodium 1500mg , cholesterol 200mg , calories 30% from fat and 7% or less from saturated  fats, daily to have 5 or more servings of fruits and vegetables.  Education: All About Nutrition: -Group instruction provided by verbal, written material, interactive activities, discussions, models, and posters to present general guidelines for heart healthy nutrition including fat, fiber, MyPlate, the role of sodium in heart healthy nutrition, utilization of the nutrition label, and utilization of this knowledge for meal planning. Follow up email sent as well. Written material given at graduation.   Biometrics:  Pre Biometrics - 09/16/22 1343       Pre Biometrics   Height 5\' 8"  (1.727 m)    Weight 106 lb 1.6 oz (48.1 kg)     Waist Circumference 26.5 inches    Hip Circumference 33 inches    Waist to Hip Ratio 0.8 %    BMI (Calculated) 16.14    Single Leg Stand 4.8 seconds              Nutrition Therapy Plan and Nutrition Goals:  Nutrition Therapy & Goals - 09/16/22 1332       Nutrition Therapy   RD appointment deferred Yes      Intervention Plan   Intervention Prescribe, educate and counsel regarding individualized specific dietary modifications aiming towards targeted core components such as weight, hypertension, lipid management, diabetes, heart failure and other comorbidities.    Expected Outcomes Short Term Goal: Understand basic principles of dietary content, such as calories, fat, sodium, cholesterol and nutrients.;Short Term Goal: A plan has been developed with personal nutrition goals set during dietitian appointment.;Long Term Goal: Adherence to prescribed nutrition plan.             Nutrition Assessments:  MEDIFICTS Score Key: >=70 Need to make dietary changes  40-70 Heart Healthy Diet <= 40 Therapeutic Level Cholesterol Diet  Flowsheet Row Pulmonary Rehab from 09/16/2022 in [facility] Cardiac and Pulmonary Rehab  Picture Your Plate Total Score on Admission 52      Picture Your Plate Scores: <54 Unhealthy dietary pattern with much room for improvement. 41-50 Dietary pattern unlikely to meet recommendations for good health and room for improvement. 51-60 More healthful dietary pattern, with some room for improvement.  >60 Healthy dietary pattern, although there may be some specific behaviors that could be improved.   Nutrition Goals Re-Evaluation:   Nutrition Goals Discharge (Final Nutrition Goals Re-Evaluation):   Psychosocial: Target Goals: Acknowledge presence or absence of significant depression and/or stress, maximize coping skills, provide positive support system. Participant is able to verbalize types and ability to use techniques and skills needed for reducing stress and  depression.   Education: Stress, Anxiety, and Depression - Group verbal and visual presentation to define topics covered.  Reviews how body is impacted by stress, anxiety, and depression.  Also discusses healthy ways to reduce stress and to treat/manage anxiety and depression.  Written material given at graduation.   Education: Sleep Hygiene -Provides group verbal and written instruction about how sleep can affect your health.  Define sleep hygiene, discuss sleep cycles and impact of sleep habits. Review good sleep hygiene tips.    Initial Review & Psychosocial Screening:  Initial Psych Review & Screening - 08/26/22 1631       Initial Review   Current issues with Current Psychotropic Meds      Family Dynamics   Good Support System? Yes   Lives alone but her son lives close by and is great support!     Screening Interventions   Interventions Encouraged to exercise    Expected Outcomes Short Term goal:  Utilizing psychosocial counselor, staff and physician to assist with identification of specific Stressors or current issues interfering with healing process. Setting desired goal for each stressor or current issue identified.;Long Term Goal: Stressors or current issues are controlled or eliminated.;Short Term goal: Identification and review with participant of any Quality of Life or Depression concerns found by scoring the questionnaire.;Long Term goal: The participant improves quality of Life and PHQ9 Scores as seen by post scores and/or verbalization of changes             Quality of Life Scores:  Scores of 19 and below usually indicate a poorer quality of life in these areas.  A difference of  2-3 points is a clinically meaningful difference.  A difference of 2-3 points in the total score of the Quality of Life Index has been associated with significant improvement in overall quality of life, self-image, physical symptoms, and general health in studies assessing change in quality of  life.  PHQ-9: Review Flowsheet       09/16/2022 03/18/2022  Depression screen PHQ 2/9  Decreased Interest 2 1  Down, Depressed, Hopeless 2 1  PHQ - 2 Score 4 2  Altered sleeping 1 -  Tired, decreased energy 2 -  Change in appetite 1 -  Feeling bad or failure about yourself  2 -  Trouble concentrating 1 -  Moving slowly or fidgety/restless 1 -  Suicidal thoughts 0 -  PHQ-9 Score 12 -  Difficult doing work/chores Somewhat difficult -    Details           Interpretation of Total Score  Total Score Depression Severity:  1-4 = Minimal depression, 5-9 = Mild depression, 10-14 = Moderate depression, 15-19 = Moderately severe depression, 20-27 = Severe depression   Psychosocial Evaluation and Intervention:  Psychosocial Evaluation - 08/26/22 1635       Psychosocial Evaluation & Interventions   Interventions Encouraged to exercise with the program and follow exercise prescription    Comments Ying "Hope" is coming to pulmonary rehab post emphysema.  She has decreased her tobacco use to now 2 cigarettes per day. She is using nicotene patch and already has cessation material and declines further information at this time. Hope is looking to improve her breathing as she has been having progressive dyspnea, reduce shortness of breath with ADL's and improve muscle strength from rehab. She lives alone and has a son that lives close by that is great support. Hope currently uses home oxygen @ 2.5L/min pulsed. She does have some balance concerns and uses a cane with ambulation. Advised she bring cane with her to rehab. She has had one fall in the past year, with only injury to her toe. 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 She has no barriers to attending the program and is looking forward to starting rehab.    Expected Outcomes Short: Attend pulmonary rehab for education and exercise.  Long: Develop and maintain positive self care habits.     Continue Psychosocial Services  Follow up required by staff             Psychosocial Re-Evaluation:   Psychosocial Discharge (Final Psychosocial Re-Evaluation):   Education: Education Goals: Education classes will be provided on a weekly basis, covering required topics. Participant will state understanding/return demonstration of topics presented.  Learning Barriers/Preferences:   General Pulmonary Education Topics:  Infection Prevention: - Provides verbal and written material to individual with discussion of  infection control including proper hand washing and proper equipment cleaning during exercise session. Flowsheet Row Pulmonary Rehab from 10/03/2022 in [facility] Cardiac and Pulmonary Rehab  Date 09/16/22  Educator NT  Instruction Review Code 1- Verbalizes Understanding       Falls Prevention: - Provides verbal and written material to individual with discussion of falls prevention and safety. Flowsheet Row Pulmonary Rehab from 10/03/2022 in [facility] Cardiac and Pulmonary Rehab  Date 09/16/22  Educator NT  Instruction Review Code 1- Verbalizes Understanding       Chronic Lung Disease Review: - Group verbal instruction with posters, models, PowerPoint presentations and videos,  to review new updates, new respiratory medications, new advancements in procedures and treatments. Providing information on websites and "800" numbers for continued self-education. Includes information about supplement oxygen, available portable oxygen systems, continuous and intermittent flow rates, oxygen safety, concentrators, and Medicare reimbursement for oxygen. Explanation of Pulmonary Drugs, including class, frequency, complications, importance of spacers, rinsing mouth after steroid MDI's, and proper cleaning methods for nebulizers. Review of basic lung anatomy and physiology related to function, structure, and complications of lung disease. Review of risk factors. Discussion about methods for  diagnosing sleep apnea and types of masks and machines for OSA. Includes a review of the use of types of environmental controls: home humidity, furnaces, filters, dust mite/pet prevention, HEPA vacuums. Discussion about weather changes, air quality and the benefits of nasal washing. Instruction on Warning signs, infection symptoms, calling MD promptly, preventive modes, and value of vaccinations. Review of effective airway clearance, coughing and/or vibration techniques. Emphasizing that all should Create an Action Plan. Written material given at graduation. Flowsheet Row Pulmonary Rehab from 10/03/2022 in [facility] Cardiac and Pulmonary Rehab  Education need identified 09/16/22  Date 10/03/22  Educator [facility]  Instruction Review Code 1- Verbalizes Understanding       AED/CPR: - Group verbal and written instruction with the use of models to demonstrate the basic use of the AED with the basic ABC's of resuscitation.    Anatomy and Cardiac Procedures: - Group verbal and visual presentation and models provide information about basic cardiac anatomy and function. Reviews the testing methods done to diagnose heart disease and the outcomes of the test results. Describes the treatment choices: Medical Management, Angioplasty, or Coronary Bypass Surgery for treating various heart conditions including Myocardial Infarction, Angina, Valve Disease, and Cardiac Arrhythmias.  Written material given at graduation. Flowsheet Row Pulmonary Rehab from 10/03/2022 in [facility] Cardiac and Pulmonary Rehab  Education need identified 09/16/22  Date 09/19/22  Educator SB  Instruction Review Code 1- Verbalizes Understanding       Medication Safety: - Group verbal and visual instruction to review commonly prescribed medications for heart and lung disease. Reviews the medication, class of the drug, and side effects. Includes the steps to properly store meds and maintain the prescription regimen.  Written material given at  graduation. Flowsheet Row Pulmonary Rehab from 10/03/2022 in [facility] Cardiac and Pulmonary Rehab  Education need identified 09/16/22       Other: -Provides group and verbal instruction on various topics (see comments)   Knowledge Questionnaire Score:  Knowledge Questionnaire Score - 09/16/22 1331       Knowledge Questionnaire Score   Pre Score 12/18              Core Components/Risk Factors/Patient Goals at Admission:  Personal Goals and Risk Factors at Admission - 08/26/22 1633       Core Components/Risk Factors/Patient Goals on Admission  Weight Management Yes    Intervention Weight Management: Develop a combined nutrition and exercise program designed to reach desired caloric intake, while maintaining appropriate intake of nutrient and fiber, sodium and fats, and appropriate energy expenditure required for the weight goal.;Weight Management: Provide education and appropriate resources to help participant work on and attain dietary goals.    Admit Weight 109 lb (49.4 kg)    Expected Outcomes Short Term: Continue to assess and modify interventions until short term weight is achieved;Long Term: Adherence to nutrition and physical activity/exercise program aimed toward attainment of established weight goal;Weight Maintenance: Understanding of the daily nutrition guidelines, which includes 25-35% calories from fat, 7% or less cal from saturated fats, less than 200mg  cholesterol, less than 1.5gm of sodium, & 5 or more servings of fruits and vegetables daily;Understanding recommendations for meals to include 15-35% energy as protein, 25-35% energy from fat, 35-60% energy from carbohydrates, less than 200mg  of dietary cholesterol, 20-35 gm of total fiber daily;Understanding of distribution of calorie intake throughout the day with the consumption of 4-5 meals/snacks    Tobacco Cessation Yes    Number of packs per day 2 cigarettes per day    Intervention Assist the participant in steps to  quit. Provide individualized education and counseling about committing to Tobacco Cessation, relapse prevention, and pharmacological support that can be provided by physician.    Improve shortness of breath with ADL's Yes    Intervention Provide education, individualized exercise plan and daily activity instruction to help decrease symptoms of SOB with activities of daily living.    Expected Outcomes Short Term: Improve cardiorespiratory fitness to achieve a reduction of symptoms when performing ADLs             Education:Diabetes - Individual verbal and written instruction to review signs/symptoms of diabetes, desired ranges of glucose level fasting, after meals and with exercise. Acknowledge that pre and post exercise glucose checks will be done for 3 sessions at entry of program.   Know Your Numbers and Heart Failure: - Group verbal and visual instruction to discuss disease risk factors for cardiac and pulmonary disease and treatment options.  Reviews associated critical values for Overweight/Obesity, Hypertension, Cholesterol, and Diabetes.  Discusses basics of heart failure: signs/symptoms and treatments.  Introduces Heart Failure Zone chart for action plan for heart failure.  Written material given at graduation.   Core Components/Risk Factors/Patient Goals Review:    Core Components/Risk Factors/Patient Goals at Discharge (Final Review):    ITP Comments:  ITP Comments     Row Name 08/26/22 1628 09/16/22 1329 09/19/22 1008 10/09/22 1239     ITP Comments Initial phone call completed. Dx can be found in [facility] 7/16. EP orientation scheduled for 8/22 at 2:30 pm. Completed and gym orientation. Initial ITP created and sent for review to Dr. Vida Rigger, Medical Director. First full day of exercise!  Patient was oriented to gym and equipment including functions, settings, policies, and procedures.  Patient's individual exercise prescription and treatment plan were reviewed.  All  starting workloads were established based on the results of the 6 minute walk test done at initial orientation visit.  The plan for exercise progression was also introduced and progression will be customized based on patient's performance and goals. 30 Day review completed. Medical Director ITP review done, changes made as directed, and signed approval by Medical Director.    new to program             Comments:

## 2022-10-10 ENCOUNTER — Encounter: Payer: 59 | Admitting: *Deleted

## 2022-10-10 DIAGNOSIS — J439 Emphysema, unspecified: Secondary | ICD-10-CM

## 2022-10-10 NOTE — Progress Notes (Signed)
Daily Session Note  Patient Details  Name: Tammy Maxwell MRN: [id number] Date of Birth: [birth year] Referring Provider:   Flowsheet Row Pulmonary Rehab from 09/16/2022 in [facility] Cardiac and Pulmonary Rehab  Referring Provider Dr. Raechel Chute, MD       Encounter Date: 10/10/2022  Check In:  Session Check In - 10/10/22 1001       Check-In   Supervising physician immediately available to respond to emergencies See telemetry face sheet for immediately available ER MD    Location [facility]-Cardiac & Pulmonary Rehab    Staff Present Ronette Deter, BS, Exercise Physiologist;Maxon Halifax BS, , Exercise Physiologist;Leyton Brownlee Katrinka Blazing, RN, ADN    Virtual Visit No    Medication changes reported     No    Fall or balance concerns reported    No    Tobacco Cessation Use Decreased    Current number of cigarettes/nicotine per day     1    Warm-up and Cool-down Performed on first and last piece of equipment    Resistance Training Performed Yes    VAD Patient? No    PAD/SET Patient? No      Pain Assessment   Currently in Pain? No/denies                Social History   Tobacco Use  Smoking Status Every Day   Current packs/day: 0.50   Average packs/day: 0.5 packs/day for 47.0 years (23.5 ttl pk-yrs)   Types: Cigarettes  Smokeless Tobacco Never  Tobacco Comments   2 cigs daily- 07/23/2022    Goals Met:  Independence with exercise equipment Exercise tolerated well No report of concerns or symptoms today Strength training completed today  Goals Unmet:  Not Applicable  Comments: Pt able to follow exercise prescription today without complaint.  Will continue to monitor for progression.    Dr. Bethann Punches is Medical Director for [facility] Cardiac Rehabilitation.  Dr. Vida Rigger is Medical Director for [facility] Pulmonary Rehabilitation.

## 2022-10-14 ENCOUNTER — Other Ambulatory Visit: Payer: Self-pay | Admitting: Acute Care

## 2022-10-14 DIAGNOSIS — F17210 Nicotine dependence, cigarettes, uncomplicated: Secondary | ICD-10-CM

## 2022-10-14 DIAGNOSIS — Z122 Encounter for screening for malignant neoplasm of respiratory organs: Secondary | ICD-10-CM

## 2022-10-14 DIAGNOSIS — Z87891 Personal history of nicotine dependence: Secondary | ICD-10-CM

## 2022-10-15 ENCOUNTER — Encounter: Payer: 59 | Admitting: *Deleted

## 2022-10-15 DIAGNOSIS — J439 Emphysema, unspecified: Secondary | ICD-10-CM

## 2022-10-15 NOTE — Progress Notes (Signed)
Daily Session Note  Patient Details  Name: Tammy Maxwell MRN: [id number] Date of Birth: 1960-12-30 Referring Provider:   Flowsheet Row Pulmonary Rehab from 09/16/2022 in [facility] Cardiac and Pulmonary Rehab  Referring Provider Dr. Raechel Chute, MD       Encounter Date: 10/15/2022  Check In:  Session Check In - 10/15/22 1015       Check-In   Supervising physician immediately available to respond to emergencies See telemetry face sheet for immediately available ER MD    Location [facility]-Cardiac & Pulmonary Rehab    Staff Present Maxon Conetta BS, , Exercise Physiologist;Margaret Best, MS, Exercise Physiologist;Noah Tickle, BS, Exercise Physiologist;Yechiel Erny, RN, BSN, CCRP    Virtual Visit No    Medication changes reported     No    Fall or balance concerns reported    No    Warm-up and Cool-down Performed on first and last piece of equipment    Resistance Training Performed Yes    VAD Patient? No    PAD/SET Patient? No      Pain Assessment   Currently in Pain? No/denies                Social History   Tobacco Use  Smoking Status Every Day   Current packs/day: 0.50   Average packs/day: 0.5 packs/day for 47.0 years (23.5 ttl pk-yrs)   Types: Cigarettes  Smokeless Tobacco Never  Tobacco Comments   2 cigs daily- 07/23/2022    Goals Met:  Proper associated with RPD/PD & O2 Sat Independence with exercise equipment Exercise tolerated well No report of concerns or symptoms today  Goals Unmet:  Not Applicable  Comments: Pt able to follow exercise prescription today without complaint.  Will continue to monitor for progression.    Dr. Bethann Punches is Medical Director for [facility] Cardiac Rehabilitation.  Dr. Vida Rigger is Medical Director for [facility] Pulmonary Rehabilitation.

## 2022-10-17 ENCOUNTER — Encounter: Payer: 59 | Admitting: *Deleted

## 2022-10-22 ENCOUNTER — Encounter: Payer: 59 | Admitting: *Deleted

## 2022-10-22 DIAGNOSIS — J439 Emphysema, unspecified: Secondary | ICD-10-CM

## 2022-10-22 NOTE — Progress Notes (Signed)
Daily Session Note  Patient Details  Name: Tammy Maxwell MRN: [id number] Date of Birth: 1960/12/20 Referring Provider:   Flowsheet Row Pulmonary Rehab from 09/16/2022 in [facility] Cardiac and Pulmonary Rehab  Referring Provider Dr. Raechel Chute, MD       Encounter Date: 10/22/2022  Check In:  Session Check In - 10/22/22 1005       Check-In   Supervising physician immediately available to respond to emergencies See telemetry face sheet for immediately available ER MD    Location [facility]-Cardiac & Pulmonary Rehab    Staff Present Maxon Conetta BS, , Exercise Physiologist;Margaret Best, MS, Exercise Physiologist;Lakeishia Truluck, RN, BSN, CCRP;Noah Tickle, BS, Exercise Physiologist    Virtual Visit No    Medication changes reported     No    Fall or balance concerns reported    No    Warm-up and Cool-down Performed on first and last piece of equipment    Resistance Training Performed Yes    VAD Patient? No    PAD/SET Patient? No      Pain Assessment   Currently in Pain? No/denies                Social History   Tobacco Use  Smoking Status Every Day   Current packs/day: 0.50   Average packs/day: 0.5 packs/day for 47.0 years (23.5 ttl pk-yrs)   Types: Cigarettes  Smokeless Tobacco Never  Tobacco Comments   2 cigs daily- 07/23/2022    Goals Met:  Proper associated with RPD/PD & O2 Sat Independence with exercise equipment Exercise tolerated well No report of concerns or symptoms today  Goals Unmet:  Not Applicable  Comments: Pt able to follow exercise prescription today without complaint.  Will continue to monitor for progression.    Dr. Bethann Punches is Medical Director for [facility] Cardiac Rehabilitation.  Dr. Vida Rigger is Medical Director for [facility] Pulmonary Rehabilitation.

## 2022-10-24 ENCOUNTER — Encounter: Payer: 59 | Admitting: *Deleted

## 2022-10-24 DIAGNOSIS — J439 Emphysema, unspecified: Secondary | ICD-10-CM | POA: Diagnosis not present

## 2022-10-24 NOTE — Progress Notes (Signed)
Daily Session Note  Patient Details  Name: Tammy Maxwell MRN: [id number] Date of Birth: 1960/07/20 Referring Provider:   Flowsheet Row Pulmonary Rehab from 09/16/2022 in [facility] Cardiac and Pulmonary Rehab  Referring Provider Dr. Raechel Chute, MD       Encounter Date: 10/24/2022  Check In:  Session Check In - 10/24/22 0932       Check-In   Supervising physician immediately available to respond to emergencies See telemetry face sheet for immediately available ER MD    Location [facility]-Cardiac & Pulmonary Rehab    Staff Present Cora Collum, RN, BSN, CCRP;Joseph Hood, RCP,RRT,BSRT;Maxon Lovington BS, , Exercise Physiologist;Tula Schryver Katrinka Blazing, RN, [place]    Virtual Visit No    Medication changes reported     No    Fall or balance concerns reported    No    Tobacco Cessation No Change    Warm-up and Cool-down Performed on first and last piece of equipment    Resistance Training Performed Yes    VAD Patient? No    PAD/SET Patient? No      Pain Assessment   Currently in Pain? No/denies                Social History   Tobacco Use  Smoking Status Every Day   Current packs/day: 0.50   Average packs/day: 0.5 packs/day for 47.0 years (23.5 ttl pk-yrs)   Types: Cigarettes  Smokeless Tobacco Never  Tobacco Comments   2 cigs daily- 07/23/2022    Goals Met:  Independence with exercise equipment Exercise tolerated well No report of concerns or symptoms today Strength training completed today  Goals Unmet:  Not Applicable  Comments: Pt able to follow exercise prescription today without complaint.  Will continue to monitor for progression.    Dr. Bethann Punches is Medical Director for [facility] Cardiac Rehabilitation.  Dr. Vida Rigger is Medical Director for [facility] Pulmonary Rehabilitation.

## 2022-10-29 ENCOUNTER — Encounter: Payer: 59 | Admitting: *Deleted

## 2022-10-29 DIAGNOSIS — J439 Emphysema, unspecified: Secondary | ICD-10-CM

## 2022-10-29 NOTE — Progress Notes (Signed)
Daily Session Note  Patient Details  Name: Tammy Maxwell MRN: [id number] Date of Birth: 1960-08-02 Referring Provider:   Flowsheet Row Pulmonary Rehab from 09/16/2022 in [facility] Cardiac and Pulmonary Rehab  Referring Provider Dr. Raechel Chute, MD       Encounter Date: 10/29/2022  Check In:  Session Check In - 10/29/22 1002       Check-In   Supervising physician immediately available to respond to emergencies See telemetry face sheet for immediately available ER MD    Location [facility]-Cardiac & Pulmonary Rehab    Staff Present Cora Collum, RN, BSN, CCRP;Noah Tickle, BS, Exercise Physiologist;Margaret Best, MS, Exercise Physiologist;Maxon Conetta BS, , Exercise Physiologist    Virtual Visit No    Medication changes reported     No    Fall or balance concerns reported    No    Warm-up and Cool-down Performed on first and last piece of equipment    Resistance Training Performed Yes    VAD Patient? No    PAD/SET Patient? No      Pain Assessment   Currently in Pain? No/denies                Social History   Tobacco Use  Smoking Status Every Day   Current packs/day: 0.50   Average packs/day: 0.5 packs/day for 47.0 years (23.5 ttl pk-yrs)   Types: Cigarettes  Smokeless Tobacco Never  Tobacco Comments   2 cigs daily- 07/23/2022    Goals Met:  Proper associated with RPD/PD & O2 Sat Independence with exercise equipment Exercise tolerated well No report of concerns or symptoms today  Goals Unmet:  Not Applicable  Comments: Pt able to follow exercise prescription today without complaint.  Will continue to monitor for progression.    Dr. Bethann Punches is Medical Director for [facility] Cardiac Rehabilitation.  Dr. Vida Rigger is Medical Director for [facility] Pulmonary Rehabilitation.

## 2022-10-31 ENCOUNTER — Encounter: Payer: 59 | Admitting: *Deleted

## 2022-11-06 ENCOUNTER — Encounter: Payer: Self-pay | Admitting: *Deleted

## 2022-11-06 DIAGNOSIS — J439 Emphysema, unspecified: Secondary | ICD-10-CM

## 2022-11-06 NOTE — Progress Notes (Signed)
Pulmonary Individual Treatment Plan  Patient Details  Name: Emaan Cheong MRN: [id number] Date of Birth: 04/03/1960 Referring Provider:   Flowsheet Row Pulmonary Rehab from 09/16/2022 in [facility] Cardiac and Pulmonary Rehab  Referring Provider Dr. Raechel Chute, MD       Initial Encounter Date:  Flowsheet Row Pulmonary Rehab from 09/16/2022 in [facility] Cardiac and Pulmonary Rehab  Date 09/16/22       Visit Diagnosis: Pulmonary emphysema, unspecified emphysema type (HCC)  Patient's Home Medications on Admission:  Current Outpatient Medications:    albuterol (PROVENTIL) (2.5 MG/3ML) 0.083% nebulizer solution, Take 2.5 mg by nebulization 4 (four) times daily., Disp: , Rfl:    aspirin 81 MG chewable tablet, Chew 81 mg by mouth daily., Disp: , Rfl:    budesonide-formoterol (SYMBICORT) 160-4.5 MCG/ACT inhaler, Inhale 2 puffs into the lungs 2 (two) times daily., Disp: , Rfl:    buPROPion (WELLBUTRIN SR) 150 MG 12 hr tablet, Take 150 mg by mouth 2 (two) times daily., Disp: , Rfl:    clonazePAM (KLONOPIN) 1 MG tablet, Take 1 mg by mouth at bedtime., Disp: , Rfl:    cyanocobalamin (VITAMIN B12) 500 MCG tablet, Take 500 mcg by mouth daily., Disp: , Rfl:    ferrous sulfate 324 MG TBEC, Take 324 mg by mouth., Disp: , Rfl:    IPRATROPIUM BROMIDE HFA IN, Inhale 2 puffs into the lungs 4 (four) times daily., Disp: , Rfl:    isosorbide mononitrate (IMDUR) 30 MG 24 hr tablet, Take 0.5 tablets (15 mg total) by mouth daily. PLEASE CALL OFFICE TO SCHEDULE APPOINTMENT PRIOR TO NEXT REFILL, Disp: 45 tablet, Rfl: 0   levothyroxine (SYNTHROID) 50 MCG tablet, Take 50 mcg by mouth daily., Disp: , Rfl:    meloxicam (MOBIC) 15 MG tablet, Take 15 mg by mouth at bedtime., Disp: , Rfl:    naloxone (NARCAN) nasal spray 4 mg/0.1 mL, SMARTSIG:Both Nares, Disp: , Rfl:    nicotine (NICODERM CQ - DOSED IN MG/24 HOURS) 14 mg/24hr patch, Place 14 mg onto the skin daily., Disp: , Rfl:    nitrofurantoin, macrocrystal-monohydrate,  (MACROBID) 100 MG capsule, Take 100 mg by mouth 2 (two) times daily., Disp: , Rfl:    nitroGLYCERIN (NITROSTAT) 0.3 MG SL tablet, Place 0.3 mg under the tongue every 5 (five) minutes as needed for chest pain., Disp: , Rfl:    omeprazole (PRILOSEC) 40 MG capsule, Take 40 mg by mouth 2 (two) times daily., Disp: , Rfl:    PARoxetine (PAXIL) 20 MG tablet, Take 20 mg by mouth at bedtime., Disp: , Rfl:    pravastatin (PRAVACHOL) 10 MG tablet, Take 10 mg by mouth daily., Disp: , Rfl:    QUEtiapine (SEROQUEL) 400 MG tablet, Take 400 mg by mouth at bedtime., Disp: , Rfl:    sucralfate (CARAFATE) 1 GM/10ML suspension, Take 2 g by mouth 3 times/day as needed-between meals & bedtime., Disp: , Rfl:    SUMAtriptan (IMITREX) 100 MG tablet, Take 100 mg by mouth every 2 (two) hours as needed for migraine. May repeat in 2 hours if headache persists or recurs., Disp: , Rfl:    Tiotropium Bromide Monohydrate (SPIRIVA RESPIMAT) 2.5 MCG/ACT AERS, Inhale 2 puffs into the lungs daily., Disp: 4 g, Rfl: 11   traMADol (ULTRAM) 50 MG tablet, Take 50 mg by mouth 4 (four) times daily., Disp: , Rfl:    Vitamin D, Ergocalciferol, (DRISDOL) 1.25 MG (50000 UNIT) CAPS capsule, Take 50,000 Units by mouth once a week., Disp: , Rfl:   Past  Medical History: Past Medical History:  Diagnosis Date   Anemia    Arthritis    Bronchitis    Chest pain    COPD (chronic obstructive pulmonary disease) (HCC)    Depression    Dysrhythmia    GERD (gastroesophageal reflux disease)    Headache    Heart murmur    History of hiatal hernia    Pneumonia    Pneumothorax    Wears dentures    full upper    Tobacco Use: Social History   Tobacco Use  Smoking Status Every Day   Current packs/day: 0.50   Average packs/day: 0.5 packs/day for 47.0 years (23.5 ttl pk-yrs)   Types: Cigarettes  Smokeless Tobacco Never  Tobacco Comments   2 cigs daily- 07/23/2022    Labs: Review Flowsheet       Latest Ref Rng & Units 02/13/2022  Labs for  [facility] Cardiac and Pulmonary Rehab  Cholestrol 0 - 200 mg/dL 161   LDL (calc) 0 - 99 mg/dL 97   HDL-C >09 mg/dL 73   Trlycerides <604 mg/dL 87     Details             Pulmonary Assessment Scores:  Pulmonary Assessment Scores     Row Name 09/16/22 1336         ADL UCSD   ADL Phase Entry     SOB Score total 82     Rest 3     Walk 4     Stairs 5     Bath 4     Dress 3     Shop 0       CAT Score   CAT Score 36       mMRC Score   mMRC Score 3              UCSD: Self-administered rating of dyspnea associated with activities of daily living (ADLs) 6-point scale (0 = "not at all" to 5 = "maximal or unable to do because of breathlessness")  Scoring Scores range from 0 to 120.  Minimally important difference is 5 units  CAT: CAT can identify the health impairment of COPD patients and is better correlated with disease progression.  CAT has a scoring range of zero to 40. The CAT score is classified into four groups of low (less than 10), medium (10 - 20), high (21-30) and very high (31-40) based on the impact level of disease on health status. A CAT score over 10 suggests significant symptoms.  A worsening CAT score could be explained by an exacerbation, poor medication adherence, poor inhaler technique, or progression of COPD or comorbid conditions.  CAT MCID is 2 points  mMRC: mMRC (Modified Medical Research Council) Dyspnea Scale is used to assess the degree of baseline functional disability in patients of respiratory disease due to dyspnea. No minimal important difference is established. A decrease in score of 1 point or greater is considered a positive change.   Pulmonary Function Assessment:   Exercise Target Goals: Exercise Program Goal: Individual exercise prescription set using results from initial 6 min walk test and THRR while considering  patient's activity barriers and safety.   Exercise Prescription Goal: Initial exercise prescription builds to 30-45  minutes a day of aerobic activity, 2-3 days per week.  Home exercise guidelines will be given to patient during program as part of exercise prescription that the participant will acknowledge.  Education: Aerobic Exercise: - Group verbal and visual presentation on the components of  exercise prescription. Introduces F.I.T.T principle from ACSM for exercise prescriptions.  Reviews F.I.T.T. principles of aerobic exercise including progression. Written material given at graduation. Flowsheet Row Pulmonary Rehab from 10/24/2022 in [facility] Cardiac and Pulmonary Rehab  Education need identified 09/16/22       Education: Resistance Exercise: - Group verbal and visual presentation on the components of exercise prescription. Introduces F.I.T.T principle from ACSM for exercise prescriptions  Reviews F.I.T.T. principles of resistance exercise including progression. Written material given at graduation.    Education: Exercise & Equipment Safety: - Individual verbal instruction and demonstration of equipment use and safety with use of the equipment. Flowsheet Row Pulmonary Rehab from 10/24/2022 in [facility] Cardiac and Pulmonary Rehab  Date 09/16/22  Educator NT  Instruction Review Code 1- Verbalizes Understanding       Education: Exercise Physiology & General Exercise Guidelines: - Group verbal and written instruction with models to review the exercise physiology of the cardiovascular system and associated critical values. Provides general exercise guidelines with specific guidelines to those with heart or lung disease.  Flowsheet Row Pulmonary Rehab from 10/24/2022 in [facility] Cardiac and Pulmonary Rehab  Date 10/24/22  Educator MB  Instruction Review Code 1- [employer] Understanding       Education: Flexibility, Balance, Mind/Body Relaxation: - Group verbal and visual presentation with interactive activity on the components of exercise prescription. Introduces F.I.T.T principle from ACSM for exercise  prescriptions. Reviews F.I.T.T. principles of flexibility and balance exercise training including progression. Also discusses the mind body connection.  Reviews various relaxation techniques to help reduce and manage stress (i.e. Deep breathing, progressive muscle relaxation, and visualization). Balance handout provided to take home. Written material given at graduation.   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 HR 107 bpm     Max Ex. BP 124/72     2 Minute Post BP 112/68       Interval HR   1 Minute HR 101     2 Minute HR 105     3 Minute HR 106     4 Minute HR 106     5 Minute HR 107     6 Minute HR 106     2 Minute Post HR 98     Interval Heart Rate? Yes       Interval Oxygen   Interval Oxygen? Yes  Baseline Oxygen Saturation % 99 %     1 Minute Oxygen Saturation % 99 %     1 Minute Liters of Oxygen 3 L     2 Minute Oxygen Saturation % 97 %     2 Minute Liters of Oxygen 3 L     3 Minute Oxygen Saturation % 97 %     3 Minute Liters of Oxygen 3 L     4 Minute Oxygen Saturation % 98 %     4 Minute Liters of Oxygen 3 L     5 Minute Oxygen Saturation % 97 %     5 Minute Liters of Oxygen 3 L     6 Minute Oxygen Saturation % 94 %     6 Minute Liters  of Oxygen 3 L     2 Minute Post Oxygen Saturation % 95 %     2 Minute Post Liters of Oxygen 3 L             Oxygen Initial Assessment:  Oxygen Initial Assessment - 08/26/22 1552       Home Oxygen   Home Oxygen Device Home Concentrator;E-Tanks    Sleep Oxygen Prescription Continuous    Liters per minute 2.5    Home Exercise Oxygen Prescription Pulsed    Liters per minute 2.5    Home Resting Oxygen Prescription Pulsed    Liters per minute 2.5    Compliance with Home Oxygen Use Yes      Intervention   Short Term Goals To learn and exhibit compliance with exercise, home and travel O2 prescription;To learn and understand importance of maintaining oxygen saturations>88%;To learn and demonstrate proper use of respiratory medications;To learn and understand importance of monitoring SPO2 with pulse oximeter and demonstrate accurate use of the pulse oximeter.;To learn and demonstrate proper pursed lip breathing techniques or other breathing techniques.     Long  Term Goals Exhibits compliance with exercise, home  and travel O2 prescription;Maintenance of O2 saturations>88%;Compliance with respiratory medication;Verbalizes importance of monitoring SPO2 with pulse oximeter and return demonstration;Exhibits proper breathing techniques, such as pursed lip breathing or other method taught during program session             Oxygen Re-Evaluation:  Oxygen Re-Evaluation     Row Name 09/19/22 1009 10/24/22 0940           Program Oxygen Prescription   Program Oxygen Prescription -- Continuous;E-Tanks      Liters per minute -- 2        Home Oxygen   Home Oxygen Device -- Home Concentrator;E-Tanks      Sleep Oxygen Prescription -- Continuous      Liters per minute -- 2.5      Home Exercise Oxygen Prescription -- Continuous      Liters per minute -- 2.5      Home Resting Oxygen Prescription -- Continuous      Liters per minute -- 2.5      Compliance with Home Oxygen Use -- Yes         Goals/Expected Outcomes   Short Term Goals -- To learn and demonstrate proper pursed lip breathing techniques or other breathing techniques.       Long  Term Goals -- Exhibits proper breathing techniques, such as pursed lip breathing or other method taught during program session      Comments Reviewed PLB technique with pt.  Talked about how it works and it's importance in maintaining their exercise saturations.  Informed patient how to perform the Pursed Lipped breathing technique. Told patient to Inhale through the nose and out the mouth with pursed lips to keep their airways open, help oxygenate them better, practice when at rest or doing strenuous activity. Patient Verbalizes understanding of technique and will work on and be reiterated during [facility].      Goals/Expected Outcomes Short: Become more profiecient at using PLB.   Long: Become independent at using PLB. Short: use PLB with exertion. Long: use PLB on exertion proficiently and independently.               Oxygen Discharge (Final Oxygen Re-Evaluation):  Oxygen Re-Evaluation - 10/24/22 0940       Program Oxygen Prescription   Program Oxygen Prescription Continuous;E-Tanks    Liters per minute 2      Home Oxygen   Home Oxygen Device Home Concentrator;E-Tanks    Sleep Oxygen Prescription Continuous    Liters per minute 2.5    Home Exercise Oxygen Prescription Continuous    Liters per minute 2.5    Home Resting Oxygen Prescription Continuous    Liters per minute 2.5    Compliance with Home Oxygen Use Yes      Goals/Expected Outcomes   Short Term Goals To learn and demonstrate proper pursed lip breathing techniques or other breathing techniques.     Long  Term Goals Exhibits proper breathing techniques, such as pursed lip breathing or other method taught during program session    Comments Informed patient how to perform the Pursed Lipped breathing technique. Told patient to Inhale through the nose and out the mouth with  pursed lips to keep their airways open, help oxygenate them better, practice when at rest or doing strenuous activity. Patient Verbalizes understanding of technique and will work on and be reiterated during [facility].    Goals/Expected Outcomes Short: use PLB with exertion. Long: use PLB on exertion proficiently and independently.             Initial Exercise Prescription:  Initial Exercise Prescription - 09/16/22 1300       Date of Initial Exercise RX and Referring Provider   Date 09/16/22    Referring Provider Dr. Raechel Chute, MD      Oxygen   Oxygen Continuous    Liters 3    Maintain Oxygen Saturation 88% or higher      Treadmill   MPH 1.8    Grade 0.5    Minutes 15    METs 2.5      Recumbant Bike   Level 2    RPM 50    Watts 25    Minutes 15    METs 3.66      NuStep   Level 2    SPM 80    Minutes 15    METs 3.66      Prescription Details   Frequency (times per week) 2    Duration Progress to 30 minutes of continuous aerobic without signs/symptoms of physical distress      Intensity   THRR 40-80% of Max Heartrate 109-143    Ratings of Perceived Exertion 11-13    Perceived Dyspnea 0-4      Progression   Progression Continue to progress workloads to maintain intensity without signs/symptoms of physical distress.      Resistance Training   Training Prescription Yes    Weight 3 lb    Reps 10-15             Perform  Capillary Blood Glucose checks as needed.  Exercise Prescription Changes:   Exercise Prescription Changes     Row Name 09/16/22 1300 09/24/22 1400 10/11/22 0900 10/25/22 0900       Response to Exercise   Blood Pressure (Admit) 116/70 100/62 100/60 120/72    Blood Pressure (Exercise) 124/72 108/62 134/72 126/66    Blood Pressure (Exit) 112/68 98/62 102/60 102/60    Heart Rate (Admit) 75 bpm 75 bpm 87 bpm 69 bpm    Heart Rate (Exercise) 107 bpm 92 bpm 96 bpm 91 bpm    Heart Rate (Exit) 98 bpm 80 bpm 80 bpm 69 bpm    Oxygen  Saturation (Admit) 99 % 97 % 97 % 97 %    Oxygen Saturation (Exercise) 94 % 97 % 90 % 85 %    Oxygen Saturation (Exit) 95 % 98 % 96 % 98 %    Rating of Perceived Exertion (Exercise) 9 9 13 13     Perceived Dyspnea (Exercise) 1 1 1 1     Symptoms Fatigue none none none    Comments Results -- -- --    Duration -- Progress to 30 minutes of  aerobic without signs/symptoms of physical distress Progress to 30 minutes of  aerobic without signs/symptoms of physical distress Progress to 30 minutes of  aerobic without signs/symptoms of physical distress    Intensity -- THRR unchanged THRR unchanged THRR unchanged      Progression   Progression -- Continue to progress workloads to maintain intensity without signs/symptoms of physical distress. Continue to progress workloads to maintain intensity without signs/symptoms of physical distress. Continue to progress workloads to maintain intensity without signs/symptoms of physical distress.    Average METs -- 2.5 2.31 2.27      Resistance Training   Training Prescription -- Yes Yes Yes    Weight -- 3 lb 3 lb 3 lb    Reps -- 10-15 10-15 10-15      Interval Training   Interval Training -- No No No      Oxygen   Oxygen -- Continuous Continuous Continuous    Liters -- 3 3 3       Treadmill   MPH -- -- -- 1.2    Grade -- -- -- 0    Minutes -- -- -- 15    METs -- -- -- 1.92      Recumbant Bike   Level -- 1 1 2     Watts -- 12 12 12     Minutes -- 15 15 15     METs -- 2.5 2.77 2.78      NuStep   Level -- 1 2 3     Minutes -- 15 15 15     METs -- 2.5 1.4 2.1      Biostep-RELP   Level -- -- -- 1    Minutes -- -- -- 15      Oxygen   Maintain Oxygen Saturation -- 88% or higher 88% or higher 88% or higher             Exercise Comments:   Exercise Comments     Row Name 09/19/22 1008           Exercise Comments First full day of exercise!  Patient was oriented to gym and equipment including functions, settings, policies, and  procedures.  Patient's individual exercise prescription and treatment plan were reviewed.  All starting workloads were established based on the results of the 6 minute walk test done at  initial orientation visit.  The plan for exercise progression was also introduced and progression will be customized based on patient's performance and goals.                Exercise Goals and Review:   Exercise Goals     Row Name 09/16/22 1342             Exercise Goals   Increase Physical Activity Yes       Intervention Provide advice, education, support and counseling about physical activity/exercise needs.;Develop an individualized exercise prescription for aerobic and resistive training based on initial evaluation findings, risk stratification, comorbidities and participant's personal goals.       Expected Outcomes Short Term: Attend rehab on a regular basis to increase amount of physical activity.;Long Term: Add in home exercise to make exercise part of routine and to increase amount of physical activity.;Long Term: Exercising regularly at least 3-5 days a week.       Increase Strength and Stamina Yes       Intervention Provide advice, education, support and counseling about physical activity/exercise needs.;Develop an individualized exercise prescription for aerobic and resistive training based on initial evaluation findings, risk stratification, comorbidities and participant's personal goals.       Expected Outcomes Short Term: Increase workloads from initial exercise prescription for resistance, speed, and METs.;Short Term: Perform resistance training exercises routinely during rehab and add in resistance training at home;Long Term: Improve cardiorespiratory fitness, muscular endurance and strength as measured by increased METs and functional capacity ( )       Able to understand and use rate of perceived exertion (RPE) scale Yes       Intervention Provide education and explanation on how to use  RPE scale       Expected Outcomes Short Term: Able to use RPE daily in rehab to express subjective intensity level;Long Term:  Able to use RPE to guide intensity level when exercising independently       Able to understand and use Dyspnea scale Yes       Intervention Provide education and explanation on how to use Dyspnea scale       Expected Outcomes Short Term: Able to use Dyspnea scale daily in rehab to express subjective sense of shortness of breath during exertion;Long Term: Able to use Dyspnea scale to guide intensity level when exercising independently       Knowledge and understanding of Target Heart Rate Range (THRR) Yes       Intervention Provide education and explanation of THRR including how the numbers were predicted and where they are located for reference       Expected Outcomes Short Term: Able to state/look up THRR;Long Term: Able to use THRR to govern intensity when exercising independently;Short Term: Able to use daily as guideline for intensity in rehab       Able to check pulse independently Yes       Intervention Provide education and demonstration on how to check pulse in carotid and radial arteries.;Review the importance of being able to check your own pulse for safety during independent exercise       Expected Outcomes Short Term: Able to explain why pulse checking is important during independent exercise;Long Term: Able to check pulse independently and accurately       Understanding of Exercise Prescription Yes       Intervention Provide education, explanation, and written materials on patient's individual exercise prescription       Expected Outcomes Short  Term: Able to explain program exercise prescription;Long Term: Able to explain home exercise prescription to exercise independently                Exercise Goals Re-Evaluation :  Exercise Goals Re-Evaluation     Row Name 09/19/22 1008 09/24/22 1437 10/11/22 0934 10/25/22 0943       Exercise Goal Re-Evaluation    Exercise Goals Review Increase Physical Activity;Able to understand and use rate of perceived exertion (RPE) scale;Knowledge and understanding of Target Heart Rate Range (THRR);Understanding of Exercise Prescription;Increase Strength and Stamina;Able to check pulse independently;Able to understand and use Dyspnea scale Increase Physical Activity;Increase Strength and Stamina;Understanding of Exercise Prescription Increase Physical Activity;Increase Strength and Stamina;Understanding of Exercise Prescription Increase Physical Activity;Increase Strength and Stamina;Understanding of Exercise Prescription    Comments Reviewed RPE  and dyspnea scale, THR and program prescription with pt today.  Pt voiced understanding and was given a copy of goals to take home. Hope is off to a good start in the program. She attended her first and only appointment during this review. During the session she used the T4 nustep and recumbent bike at level 1. We will continue to monitor her progress in the program. Hope is doing well in the program. She continues to do well at level 1 on the recumbent bike and improved to level 2 on the T4 nustep. She also continues to do well with 3 lb hand weights for resistance training. We will continue to monitor her progress in the program. Hope continues to do well in the program. She recently improved to level 2 on the recumbent bike and level 3 on the T4 nustep. She also began using the treadmill at a speed of 1.2 mph with no incline. We will continue to monitor her progress in the program.    Expected Outcomes Short: Use RPE daily to regulate intensity.  Long: Follow program prescription in THR. Short: Continue to follow current exercise prescription. Long: Continue exercise to improve strength and stamina. Short: Try level 2 on the recumbent bike. Long: Continue exercise to improve strength and stamina. Short: Continue to walk on treadmill. Long: Continue exercise to improve strength and  stamina.             Discharge Exercise Prescription (Final Exercise Prescription Changes):  Exercise Prescription Changes - 10/25/22 0900       Response to Exercise   Blood Pressure (Admit) 120/72    Blood Pressure (Exercise) 126/66    Blood Pressure (Exit) 102/60    Heart Rate (Admit) 69 bpm    Heart Rate (Exercise) 91 bpm    Heart Rate (Exit) 69 bpm    Oxygen Saturation (Admit) 97 %    Oxygen Saturation (Exercise) 85 %    Oxygen Saturation (Exit) 98 %    Rating of Perceived Exertion (Exercise) 13    Perceived Dyspnea (Exercise) 1    Symptoms none    Duration Progress to 30 minutes of  aerobic without signs/symptoms of physical distress    Intensity THRR unchanged      Progression   Progression Continue to progress workloads to maintain intensity without signs/symptoms of physical distress.    Average METs 2.27      Resistance Training   Training Prescription Yes    Weight 3 lb    Reps 10-15      Interval Training   Interval Training No      Oxygen   Oxygen Continuous    Liters 3  Treadmill   MPH 1.2    Grade 0    Minutes 15    METs 1.92      Recumbant Bike   Level 2    Watts 12    Minutes 15    METs 2.78      NuStep   Level 3    Minutes 15    METs 2.1      Biostep-RELP   Level 1    Minutes 15      Oxygen   Maintain Oxygen Saturation 88% or higher             Nutrition:  Target Goals: Understanding of nutrition guidelines, daily intake of sodium 1500mg , cholesterol 200mg , calories 30% from fat and 7% or less from saturated fats, daily to have 5 or more servings of fruits and vegetables.  Education: All About Nutrition: -Group instruction provided by verbal, written material, interactive activities, discussions, models, and posters to present general guidelines for heart healthy nutrition including fat, fiber, MyPlate, the role of sodium in heart healthy nutrition, utilization of the nutrition label, and utilization of this  knowledge for meal planning. Follow up email sent as well. Written material given at graduation.   Biometrics:  Pre Biometrics - 09/16/22 1343       Pre Biometrics   Height 5\' 8"  (1.727 m)    Weight 106 lb 1.6 oz (48.1 kg)    Waist Circumference 26.5 inches    Hip Circumference 33 inches    Waist to Hip Ratio 0.8 %    BMI (Calculated) 16.14    Single Leg Stand 4.8 seconds              Nutrition Therapy Plan and Nutrition Goals:  Nutrition Therapy & Goals - 09/16/22 1332       Nutrition Therapy   RD appointment deferred Yes      Intervention Plan   Intervention Prescribe, educate and counsel regarding individualized specific dietary modifications aiming towards targeted core components such as weight, hypertension, lipid management, diabetes, heart failure and other comorbidities.    Expected Outcomes Short Term Goal: Understand basic principles of dietary content, such as calories, fat, sodium, cholesterol and nutrients.;Short Term Goal: A plan has been developed with personal nutrition goals set during dietitian appointment.;Long Term Goal: Adherence to prescribed nutrition plan.             Nutrition Assessments:  MEDIFICTS Score Key: >=70 Need to make dietary changes  40-70 Heart Healthy Diet <= 40 Therapeutic Level Cholesterol Diet  Flowsheet Row Pulmonary Rehab from 09/16/2022 in [facility] Cardiac and Pulmonary Rehab  Picture Your Plate Total Score on Admission 52      Picture Your Plate Scores: <40 Unhealthy dietary pattern with much room for improvement. 41-50 Dietary pattern unlikely to meet recommendations for good health and room for improvement. 51-60 More healthful dietary pattern, with some room for improvement.  >60 Healthy dietary pattern, although there may be some specific behaviors that could be improved.   Nutrition Goals Re-Evaluation:  Nutrition Goals Re-Evaluation     Row Name 10/24/22 0941             Goals   Current Weight 111 lb  (50.3 kg)       Comment Patient was informed on why it is important to maintain a balanced diet when dealing with Respiratory issues. Explained that it takes a lot of energy to breath and when they are short of breath often they will need to  have a good diet to help keep up with the calories they are expending for breathing.       Expected Outcome Short: Choose and plan snacks accordingly to patients caloric intake to improve breathing. Long: Maintain a diet independently that meets their caloric intake to aid in daily shortness of breath.                Nutrition Goals Discharge (Final Nutrition Goals Re-Evaluation):  Nutrition Goals Re-Evaluation - 10/24/22 0941       Goals   Current Weight 111 lb (50.3 kg)    Comment Patient was informed on why it is important to maintain a balanced diet when dealing with Respiratory issues. Explained that it takes a lot of energy to breath and when they are short of breath often they will need to have a good diet to help keep up with the calories they are expending for breathing.    Expected Outcome Short: Choose and plan snacks accordingly to patients caloric intake to improve breathing. Long: Maintain a diet independently that meets their caloric intake to aid in daily shortness of breath.             Psychosocial: Target Goals: Acknowledge presence or absence of significant depression and/or stress, maximize coping skills, provide positive support system. Participant is able to verbalize types and ability to use techniques and skills needed for reducing stress and depression.   Education: Stress, Anxiety, and Depression - Group verbal and visual presentation to define topics covered.  Reviews how body is impacted by stress, anxiety, and depression.  Also discusses healthy ways to reduce stress and to treat/manage anxiety and depression.  Written material given at graduation.   Education: Sleep Hygiene -Provides group verbal and written  instruction about how sleep can affect your health.  Define sleep hygiene, discuss sleep cycles and impact of sleep habits. Review good sleep hygiene tips.    Initial Review & Psychosocial Screening:  Initial Psych Review & Screening - 08/26/22 1631       Initial Review   Current issues with Current Psychotropic Meds      Family Dynamics   Good Support System? Yes   Lives alone but her son lives close by and is great support!     Screening Interventions   Interventions Encouraged to exercise    Expected Outcomes Short Term goal: Utilizing psychosocial counselor, staff and physician to assist with identification of specific Stressors or current issues interfering with healing process. Setting desired goal for each stressor or current issue identified.;Long Term Goal: Stressors or current issues are controlled or eliminated.;Short Term goal: Identification and review with participant of any Quality of Life or Depression concerns found by scoring the questionnaire.;Long Term goal: The participant improves quality of Life and PHQ9 Scores as seen by post scores and/or verbalization of changes             Quality of Life Scores:  Scores of 19 and below usually indicate a poorer quality of life in these areas.  A difference of  2-3 points is a clinically meaningful difference.  A difference of 2-3 points in the total score of the Quality of Life Index has been associated with significant improvement in overall quality of life, self-image, physical symptoms, and general health in studies assessing change in quality of life.  PHQ-9: Review Flowsheet       10/24/2022 09/16/2022 03/18/2022  Depression screen PHQ 2/9  Decreased Interest 2 2 1   Down, Depressed, Hopeless  1 2 1   PHQ - 2 Score 3 4 2   Altered sleeping 1 1 -  Tired, decreased energy 2 2 -  Change in appetite 0 1 -  Feeling bad or failure about yourself  2 2 -  Trouble concentrating 1 1 -  Moving slowly or fidgety/restless 1 1 -   Suicidal thoughts 0 0 -  PHQ-9 Score 10 12 -  Difficult doing work/chores Somewhat difficult Somewhat difficult -    Details           Interpretation of Total Score  Total Score Depression Severity:  1-4 = Minimal depression, 5-9 = Mild depression, 10-14 = Moderate depression, 15-19 = Moderately severe depression, 20-27 = Severe depression   Psychosocial Evaluation and Intervention:  Psychosocial Evaluation - 08/26/22 1635       Psychosocial Evaluation & Interventions   Interventions Encouraged to exercise with the program and follow exercise prescription    Comments Myleigh "Hope" is coming to pulmonary rehab post emphysema.  She has decreased her tobacco use to now 2 cigarettes per day. She is using nicotene patch and already has cessation material and declines further information at this time. Hope is looking to improve her breathing as she has been having progressive dyspnea, reduce shortness of breath with ADL's and improve muscle strength from rehab. She lives alone and has a son that lives close by that is great support. Hope currently uses home oxygen @ 2.5L/min pulsed. She does have some balance concerns and uses a cane with ambulation. Advised she bring cane with her to rehab. She has had one fall in the past year, with only injury to her toe. 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 She has no barriers to attending the program and is looking forward to starting rehab.    Expected Outcomes Short: Attend pulmonary rehab for education and exercise.  Long: Develop and maintain positive self care habits.    Continue Psychosocial Services  Follow up required by staff             Psychosocial Re-Evaluation:  Psychosocial Re-Evaluation     Row Name 10/24/22 0945             Psychosocial Re-Evaluation   Current issues with Current Stress Concerns;History of Depression;Current Psychotropic Meds       Comments Reviewed  patient health questionnaire (PHQ-9) with patient for follow up. Previously, patients score indicated signs/symptoms of depression.  Reviewed to see if patient is improving symptom wise while in program.  Score improved and patient states that it is because she is able to come to exercise class.       Expected Outcomes Short: Continue to attend [facility] regularly for regular exercise and social engagement. Long: Continue to improve symptoms and manage a positive mental state.       Interventions Encouraged to attend Pulmonary Rehabilitation for the exercise       Continue Psychosocial Services  Follow up required by staff                Psychosocial Discharge (Final Psychosocial Re-Evaluation):  Psychosocial Re-Evaluation - 10/24/22 0945       Psychosocial Re-Evaluation   Current issues with Current Stress Concerns;History of Depression;Current Psychotropic Meds    Comments Reviewed patient health questionnaire (PHQ-9) with patient for follow up. Previously, patients score indicated signs/symptoms of depression.  Reviewed to see if patient is improving symptom wise while in program.  Score improved and patient states that it is because she is able to come to exercise class.    Expected Outcomes Short: Continue to attend [facility] regularly for regular exercise and social engagement. Long: Continue to improve symptoms and manage a positive mental state.    Interventions Encouraged to attend Pulmonary Rehabilitation for the exercise    Continue Psychosocial Services  Follow up required by staff             Education: Education Goals: Education classes will be provided on a weekly basis, covering required topics. Participant will state understanding/return demonstration of topics presented.  Learning Barriers/Preferences:   General Pulmonary Education Topics:  Infection Prevention: - Provides verbal and written material to individual with discussion of infection control including  proper hand washing and proper equipment cleaning during exercise session. Flowsheet Row Pulmonary Rehab from 10/24/2022 in [facility] Cardiac and Pulmonary Rehab  Date 09/16/22  Educator NT  Instruction Review Code 1- Verbalizes Understanding       Falls Prevention: - Provides verbal and written material to individual with discussion of falls prevention and safety. Flowsheet Row Pulmonary Rehab from 10/24/2022 in [facility] Cardiac and Pulmonary Rehab  Date 09/16/22  Educator NT  Instruction Review Code 1- Verbalizes Understanding       Chronic Lung Disease Review: - Group verbal instruction with posters, models, PowerPoint presentations and videos,  to review new updates, new respiratory medications, new advancements in procedures and treatments. Providing information on websites and "800" numbers for continued self-education. Includes information about supplement oxygen, available portable oxygen systems, continuous and intermittent flow rates, oxygen safety, concentrators, and Medicare reimbursement for oxygen. Explanation of Pulmonary Drugs, including class, frequency, complications, importance of spacers, rinsing mouth after steroid MDI's, and proper cleaning methods for nebulizers. Review of basic lung anatomy and physiology related to function, structure, and complications of lung disease. Review of risk factors. Discussion about methods for diagnosing sleep apnea and types of masks and machines for OSA. Includes a review of the use of types of environmental controls: home humidity, furnaces, filters, dust mite/pet prevention, HEPA vacuums. Discussion about weather changes, air quality and the benefits of nasal washing. Instruction on Warning signs, infection symptoms, calling MD promptly, preventive modes, and value of vaccinations. Review of effective airway clearance, coughing and/or vibration techniques. Emphasizing that all should Create an Action Plan. Written material given at  graduation. Flowsheet Row Pulmonary Rehab from 10/24/2022 in [facility] Cardiac and Pulmonary Rehab  Education need identified 09/16/22  Date 10/03/22  Educator [facility]  Instruction Review Code 1- Verbalizes Understanding       AED/CPR: - Group verbal and written instruction with the use of models to demonstrate the basic use of the AED with the basic ABC's of resuscitation.    Anatomy and Cardiac Procedures: - Group verbal and visual presentation and models provide information about basic cardiac anatomy and function. Reviews the testing methods done to diagnose heart disease and the outcomes of the test results. Describes the treatment choices: Medical Management, Angioplasty, or Coronary Bypass Surgery for treating various heart conditions including Myocardial Infarction, Angina, Valve Disease, and Cardiac Arrhythmias.  Written material given at graduation. Flowsheet Row Pulmonary Rehab from 10/24/2022 in [facility] Cardiac and Pulmonary Rehab  Education need identified 09/16/22  Date 09/19/22  Educator SB  Instruction Review Code 1- Verbalizes Understanding       Medication Safety: - Group verbal and visual instruction to review commonly prescribed medications for heart and lung disease. Reviews the medication, class of the  drug, and side effects. Includes the steps to properly store meds and maintain the prescription regimen.  Written material given at graduation. Flowsheet Row Pulmonary Rehab from 10/24/2022 in [facility] Cardiac and Pulmonary Rehab  Education need identified 09/16/22       Other: -Provides group and verbal instruction on various topics (see comments)   Knowledge Questionnaire Score:  Knowledge Questionnaire Score - 09/16/22 1331       Knowledge Questionnaire Score   Pre Score 12/18              Core Components/Risk Factors/Patient Goals at Admission:  Personal Goals and Risk Factors at Admission - 08/26/22 1633       Core Components/Risk Factors/Patient Goals  on Admission    Weight Management Yes    Intervention Weight Management: Develop a combined nutrition and exercise program designed to reach desired caloric intake, while maintaining appropriate intake of nutrient and fiber, sodium and fats, and appropriate energy expenditure required for the weight goal.;Weight Management: Provide education and appropriate resources to help participant work on and attain dietary goals.    Admit Weight 109 lb (49.4 kg)    Expected Outcomes Short Term: Continue to assess and modify interventions until short term weight is achieved;Long Term: Adherence to nutrition and physical activity/exercise program aimed toward attainment of established weight goal;Weight Maintenance: Understanding of the daily nutrition guidelines, which includes 25-35% calories from fat, 7% or less cal from saturated fats, less than 200mg  cholesterol, less than 1.5gm of sodium, & 5 or more servings of fruits and vegetables daily;Understanding recommendations for meals to include 15-35% energy as protein, 25-35% energy from fat, 35-60% energy from carbohydrates, less than 200mg  of dietary cholesterol, 20-35 gm of total fiber daily;Understanding of distribution of calorie intake throughout the day with the consumption of 4-5 meals/snacks    Tobacco Cessation Yes    Number of packs per day 2 cigarettes per day    Intervention Assist the participant in steps to quit. Provide individualized education and counseling about committing to Tobacco Cessation, relapse prevention, and pharmacological support that can be provided by physician.    Improve shortness of breath with ADL's Yes    Intervention Provide education, individualized exercise plan and daily activity instruction to help decrease symptoms of SOB with activities of daily living.    Expected Outcomes Short Term: Improve cardiorespiratory fitness to achieve a reduction of symptoms when performing ADLs             Education:Diabetes -  Individual verbal and written instruction to review signs/symptoms of diabetes, desired ranges of glucose level fasting, after meals and with exercise. Acknowledge that pre and post exercise glucose checks will be done for 3 sessions at entry of program.   Know Your Numbers and Heart Failure: - Group verbal and visual instruction to discuss disease risk factors for cardiac and pulmonary disease and treatment options.  Reviews associated critical values for Overweight/Obesity, Hypertension, Cholesterol, and Diabetes.  Discusses basics of heart failure: signs/symptoms and treatments.  Introduces Heart Failure Zone chart for action plan for heart failure.  Written material given at graduation. Flowsheet Row Pulmonary Rehab from 10/24/2022 in [facility] Cardiac and Pulmonary Rehab  Date 10/10/22  Educator SB  Instruction Review Code 1- Verbalizes Understanding       Core Components/Risk Factors/Patient Goals Review:   Goals and Risk Factor Review     Row Name 10/24/22 [phone]             Core Components/Risk Factors/Patient Goals Review  Personal Goals Review Improve shortness of breath with ADL's       Review Spoke to patient about their shortness of breath and what they can do to improve. Patient has been informed of breathing techniques when starting the program. Patient is informed to tell staff if they have had any med changes and that certain meds they are taking or not taking can be causing shortness of breath.       Expected Outcomes Short: Attend [facility] regularly to improve shortness of breath with ADL's. Long: maintain independence with ADL's                Core Components/Risk Factors/Patient Goals at Discharge (Final Review):   Goals and Risk Factor Review - 10/24/22 0942       Core Components/Risk Factors/Patient Goals Review   Personal Goals Review Improve shortness of breath with ADL's    Review Spoke to patient about their shortness of breath and what they can do to  improve. Patient has been informed of breathing techniques when starting the program. Patient is informed to tell staff if they have had any med changes and that certain meds they are taking or not taking can be causing shortness of breath.    Expected Outcomes Short: Attend [facility] regularly to improve shortness of breath with ADL's. Long: maintain independence with ADL's             ITP Comments:  ITP Comments     Row Name 08/26/22 1628 09/16/22 1329 09/19/22 1008 10/09/22 1239 11/06/22 0854   ITP Comments Initial phone call completed. Dx can be found in [facility] 7/16. EP orientation scheduled for 8/22 at 2:30 pm. Completed and gym orientation. Initial ITP created and sent for review to Dr. Vida Rigger, Medical Director. First full day of exercise!  Patient was oriented to gym and equipment including functions, settings, policies, and procedures.  Patient's individual exercise prescription and treatment plan were reviewed.  All starting workloads were established based on the results of the 6 minute walk test done at initial orientation visit.  The plan for exercise progression was also introduced and progression will be customized based on patient's performance and goals. 30 Day review completed. Medical Director ITP review done, changes made as directed, and signed approval by Medical Director.    new to program 30 Day review completed. Medical Director ITP review done, changes made as directed, and signed approval by Medical Director.            Comments:

## 2022-11-07 ENCOUNTER — Encounter: Payer: 59 | Admitting: *Deleted

## 2022-11-07 DIAGNOSIS — J439 Emphysema, unspecified: Secondary | ICD-10-CM

## 2022-11-07 NOTE — Progress Notes (Signed)
Daily Session Note  Patient Details  Name: Tammy Maxwell MRN: [id number] Date of Birth: February 28, 1960 Referring Provider:   Flowsheet Row Pulmonary Rehab from 09/16/2022 in [facility] Cardiac and Pulmonary Rehab  Referring Provider Dr. Raechel Chute, MD       Encounter Date: 11/07/2022  Check In:  Session Check In - 11/07/22 1007       Check-In   Supervising physician immediately available to respond to emergencies See telemetry face sheet for immediately available ER MD    Location [facility]-Cardiac & Pulmonary Rehab    Staff Present Cora Collum, RN, BSN, CCRP;Joseph Hood, RCP,RRT,BSRT;Maxon [place] BS, , Exercise Physiologist;Reene Harlacher Katrinka Blazing, RN, [place]    Virtual Visit No    Medication changes reported     No    Fall or balance concerns reported    No    Tobacco Cessation No Change    Warm-up and Cool-down Performed on first and last piece of equipment    Resistance Training Performed Yes    VAD Patient? No    PAD/SET Patient? No      Pain Assessment   Currently in Pain? No/denies                Social History   Tobacco Use  Smoking Status Every Day   Current packs/day: 0.50   Average packs/day: 0.5 packs/day for 47.0 years (23.5 ttl pk-yrs)   Types: Cigarettes  Smokeless Tobacco Never  Tobacco Comments   2 cigs daily- 07/23/2022    Goals Met:  Independence with exercise equipment Exercise tolerated well No report of concerns or symptoms today Strength training completed today  Goals Unmet:  Not Applicable  Comments: Pt able to follow exercise prescription today without complaint.  Will continue to monitor for progression.    Dr. Bethann Punches is Medical Director for [facility] Cardiac Rehabilitation.  Dr. Vida Rigger is Medical Director for [facility] Pulmonary Rehabilitation.

## 2022-11-12 ENCOUNTER — Encounter: Payer: 59 | Attending: Student in an Organized Health Care Education/Training Program | Admitting: *Deleted

## 2022-11-12 DIAGNOSIS — J439 Emphysema, unspecified: Secondary | ICD-10-CM | POA: Diagnosis present

## 2022-11-12 DIAGNOSIS — Z5189 Encounter for other specified aftercare: Secondary | ICD-10-CM | POA: Diagnosis not present

## 2022-11-12 NOTE — Progress Notes (Signed)
Daily Session Note  Patient Details  Name: Tammy Maxwell MRN: [id number] Date of Birth: 02-01-1960 Referring Provider:   Flowsheet Row Pulmonary Rehab from 09/16/2022 in [facility] Cardiac and Pulmonary Rehab  Referring Provider Dr. Raechel Chute, MD       Encounter Date: 11/12/2022  Check In:  Session Check In - 11/12/22 0927       Check-In   Supervising physician immediately available to respond to emergencies See telemetry face sheet for immediately available ER MD    Location [facility]-Cardiac & Pulmonary Rehab    Staff Present Cora Collum, RN, BSN, CCRP;Noah Tickle, BS, Exercise Physiologist;Margaret Best, MS, Exercise Physiologist;Maxon [place] BS, , Exercise Physiologist    Virtual Visit No    Medication changes reported     No    Fall or balance concerns reported    No    Warm-up and Cool-down Performed on first and last piece of equipment    Resistance Training Performed Yes    VAD Patient? No    PAD/SET Patient? No      Pain Assessment   Currently in Pain? No/denies                Social History   Tobacco Use  Smoking Status Every Day   Current packs/day: 0.50   Average packs/day: 0.5 packs/day for 47.0 years (23.5 ttl pk-yrs)   Types: Cigarettes  Smokeless Tobacco Never  Tobacco Comments   2 cigs daily- 07/23/2022    Goals Met:  Proper associated with RPD/PD & O2 Sat Independence with exercise equipment Exercise tolerated well No report of concerns or symptoms today  Goals Unmet:  Not Applicable  Comments: Pt able to follow exercise prescription today without complaint.  Will continue to monitor for progression.    Dr. Bethann Punches is Medical Director for [facility] Cardiac Rehabilitation.  Dr. Vida Rigger is Medical Director for [facility] Pulmonary Rehabilitation.

## 2022-11-14 ENCOUNTER — Encounter: Payer: 59 | Admitting: *Deleted

## 2022-11-19 DIAGNOSIS — J439 Emphysema, unspecified: Secondary | ICD-10-CM | POA: Diagnosis not present

## 2022-11-19 NOTE — Progress Notes (Signed)
Daily Session Note  Patient Details  Name: Kanae Canario MRN: [id number] Date of Birth: Dec 25, 1960 Referring Provider:   Flowsheet Row Pulmonary Rehab from 09/16/2022 in [facility]  Referring Provider Dr. Raechel Chute, MD       Encounter Date: 11/19/2022  Check In:  Session Check In - 11/19/22 0935       Check-In   Supervising physician immediately available to respond to emergencies See telemetry face sheet for immediately available ER MD    Location [facility]-Card[facility] & Pul[facility]    Staff Present Maxon Conetta BS, , Exercise Physiologist;Margaret Best, MS, Exercise Physiologist;Carra Brindley, RN, BSN;Noah Tickle, BS, Exercise Physiologist    Virtual Visit No    Medication changes reported     No    Fall or balance concerns reported    No    Tobacco Cessation No Change    Warm-up and Cool-down Performed on first and last piece of equipment    Resistance Training Performed Yes    VAD Patient? No    PAD/SET Patient? No      Pain Assessment   Currently in Pain? No/denies                Social History   Tobacco Use  Smoking Status Every Day   Current packs/day: 0.50   Average packs/day: 0.5 packs/day for 47.0 years (23.5 ttl pk-yrs)   Types: Cigarettes  Smokeless Tobacco Never  Tobacco Comments   2 cigs daily- 07/23/2022    Goals Met:  Independence with exercise equipment Exercise tolerated well No report of concerns or symptoms today Strength training completed today  Goals Unmet:  Not Applicable  Comments: Pt able to follow exercise prescription today without complaint.  Will continue to monitor for progression.   Dr. Bethann Punches is Medical Director for [facility].  Dr. Vida Rigger is Medical Director for [facility].

## 2022-11-21 ENCOUNTER — Encounter: Payer: 59 | Admitting: *Deleted

## 2022-11-27 ENCOUNTER — Encounter: Payer: Self-pay | Admitting: *Deleted

## 2022-11-27 DIAGNOSIS — J439 Emphysema, unspecified: Secondary | ICD-10-CM

## 2022-11-27 NOTE — Progress Notes (Signed)
Pulmonary Individual Treatment Plan  Patient Details  Name: Lyndy Powles MRN: [id number] Date of Birth: Jul 21, 1960 Referring Provider:   Flowsheet Row Pulmonary Rehab from 09/16/2022 in [facility] Cardiac and Pulmonary Rehab  Referring Provider Dr. Raechel Chute, MD       Initial Encounter Date:  Flowsheet Row Pulmonary Rehab from 09/16/2022 in [facility] Cardiac and Pulmonary Rehab  Date 09/16/22       Visit Diagnosis: Pulmonary emphysema, unspecified emphysema type (HCC)  Patient's Home Medications on Admission:  Current Outpatient Medications:    albuterol (PROVENTIL) (2.5 MG/3ML) 0.083% nebulizer solution, Take 2.5 mg by nebulization 4 (four) times daily., Disp: , Rfl:    aspirin 81 MG chewable tablet, Chew 81 mg by mouth daily., Disp: , Rfl:    budesonide-formoterol (SYMBICORT) 160-4.5 MCG/ACT inhaler, Inhale 2 puffs into the lungs 2 (two) times daily., Disp: , Rfl:    buPROPion (WELLBUTRIN SR) 150 MG 12 hr tablet, Take 150 mg by mouth 2 (two) times daily., Disp: , Rfl:    clonazePAM (KLONOPIN) 1 MG tablet, Take 1 mg by mouth at bedtime., Disp: , Rfl:    cyanocobalamin (VITAMIN B12) 500 MCG tablet, Take 500 mcg by mouth daily., Disp: , Rfl:    ferrous sulfate 324 MG TBEC, Take 324 mg by mouth., Disp: , Rfl:    IPRATROPIUM BROMIDE HFA IN, Inhale 2 puffs into the lungs 4 (four) times daily., Disp: , Rfl:    isosorbide mononitrate (IMDUR) 30 MG 24 hr tablet, Take 0.5 tablets (15 mg total) by mouth daily. PLEASE CALL OFFICE TO SCHEDULE APPOINTMENT PRIOR TO NEXT REFILL, Disp: 45 tablet, Rfl: 0   levothyroxine (SYNTHROID) 50 MCG tablet, Take 50 mcg by mouth daily., Disp: , Rfl:    meloxicam (MOBIC) 15 MG tablet, Take 15 mg by mouth at bedtime., Disp: , Rfl:    naloxone (NARCAN) nasal spray 4 mg/0.1 mL, SMARTSIG:Both Nares, Disp: , Rfl:    nicotine (NICODERM CQ - DOSED IN MG/24 HOURS) 14 mg/24hr patch, Place 14 mg onto the skin daily., Disp: , Rfl:    nitrofurantoin, macrocrystal-monohydrate,  (MACROBID) 100 MG capsule, Take 100 mg by mouth 2 (two) times daily., Disp: , Rfl:    nitroGLYCERIN (NITROSTAT) 0.3 MG SL tablet, Place 0.3 mg under the tongue every 5 (five) minutes as needed for chest pain., Disp: , Rfl:    omeprazole (PRILOSEC) 40 MG capsule, Take 40 mg by mouth 2 (two) times daily., Disp: , Rfl:    PARoxetine (PAXIL) 20 MG tablet, Take 20 mg by mouth at bedtime., Disp: , Rfl:    pravastatin (PRAVACHOL) 10 MG tablet, Take 10 mg by mouth daily., Disp: , Rfl:    QUEtiapine (SEROQUEL) 400 MG tablet, Take 400 mg by mouth at bedtime., Disp: , Rfl:    sucralfate (CARAFATE) 1 GM/10ML suspension, Take 2 g by mouth 3 times/day as needed-between meals & bedtime., Disp: , Rfl:    SUMAtriptan (IMITREX) 100 MG tablet, Take 100 mg by mouth every 2 (two) hours as needed for migraine. May repeat in 2 hours if headache persists or recurs., Disp: , Rfl:    Tiotropium Bromide Monohydrate (SPIRIVA RESPIMAT) 2.5 MCG/ACT AERS, Inhale 2 puffs into the lungs daily., Disp: 4 g, Rfl: 11   traMADol (ULTRAM) 50 MG tablet, Take 50 mg by mouth 4 (four) times daily., Disp: , Rfl:    Vitamin D, Ergocalciferol, (DRISDOL) 1.25 MG (50000 UNIT) CAPS capsule, Take 50,000 Units by mouth once a week., Disp: , Rfl:   Past  Medical History: Past Medical History:  Diagnosis Date   Anemia    Arthritis    Bronchitis    Chest pain    COPD (chronic obstructive pulmonary disease) (HCC)    Depression    Dysrhythmia    GERD (gastroesophageal reflux disease)    Headache    Heart murmur    History of hiatal hernia    Pneumonia    Pneumothorax    Wears dentures    full upper    Tobacco Use: Social History   Tobacco Use  Smoking Status Every Day   Current packs/day: 0.50   Average packs/day: 0.5 packs/day for 47.0 years (23.5 ttl pk-yrs)   Types: Cigarettes  Smokeless Tobacco Never  Tobacco Comments   2 cigs daily- 07/23/2022    Labs: Review Flowsheet       Latest Ref Rng & Units 02/13/2022  Labs for  [facility] Cardiac and Pulmonary Rehab  Cholestrol 0 - 200 mg/dL 191   LDL (calc) 0 - 99 mg/dL 97   HDL-C >47 mg/dL 73   Trlycerides <829 mg/dL 87     Details             Pulmonary Assessment Scores:  Pulmonary Assessment Scores     Row Name 09/16/22 1336         ADL UCSD   ADL Phase Entry     SOB Score total 82     Rest 3     Walk 4     Stairs 5     Bath 4     Dress 3     Shop 0       CAT Score   CAT Score 36       mMRC Score   mMRC Score 3              UCSD: Self-administered rating of dyspnea associated with activities of daily living (ADLs) 6-point scale (0 = "not at all" to 5 = "maximal or unable to do because of breathlessness")  Scoring Scores range from 0 to 120.  Minimally important difference is 5 units  CAT: CAT can identify the health impairment of COPD patients and is better correlated with disease progression.  CAT has a scoring range of zero to 40. The CAT score is classified into four groups of low (less than 10), medium (10 - 20), high (21-30) and very high (31-40) based on the impact level of disease on health status. A CAT score over 10 suggests significant symptoms.  A worsening CAT score could be explained by an exacerbation, poor medication adherence, poor inhaler technique, or progression of COPD or comorbid conditions.  CAT MCID is 2 points  mMRC: mMRC (Modified Medical Research Council) Dyspnea Scale is used to assess the degree of baseline functional disability in patients of respiratory disease due to dyspnea. No minimal important difference is established. A decrease in score of 1 point or greater is considered a positive change.   Pulmonary Function Assessment:   Exercise Target Goals: Exercise Program Goal: Individual exercise prescription set using results from initial 6 min walk test and THRR while considering  patient's activity barriers and safety.   Exercise Prescription Goal: Initial exercise prescription builds to 30-45  minutes a day of aerobic activity, 2-3 days per week.  Home exercise guidelines will be given to patient during program as part of exercise prescription that the participant will acknowledge.  Education: Aerobic Exercise: - Group verbal and visual presentation on the components of  exercise prescription. Introduces F.I.T.T principle from ACSM for exercise prescriptions.  Reviews F.I.T.T. principles of aerobic exercise including progression. Written material given at graduation. Flowsheet Row Pulmonary Rehab from 11/07/2022 in [facility] Cardiac and Pulmonary Rehab  Education need identified 09/16/22  Date 11/07/22  Educator MB  Instruction Review Code 1- [employer] Understanding       Education: Resistance Exercise: - Group verbal and visual presentation on the components of exercise prescription. Introduces F.I.T.T principle from ACSM for exercise prescriptions  Reviews F.I.T.T. principles of resistance exercise including progression. Written material given at graduation.    Education: Exercise & Equipment Safety: - Individual verbal instruction and demonstration of equipment use and safety with use of the equipment. Flowsheet Row Pulmonary Rehab from 11/07/2022 in [facility] Cardiac and Pulmonary Rehab  Date 09/16/22  Educator NT  Instruction Review Code 1- Verbalizes Understanding       Education: Exercise Physiology & General Exercise Guidelines: - Group verbal and written instruction with models to review the exercise physiology of the cardiovascular system and associated critical values. Provides general exercise guidelines with specific guidelines to those with heart or lung disease.  Flowsheet Row Pulmonary Rehab from 11/07/2022 in [facility] Cardiac and Pulmonary Rehab  Date 10/24/22  Educator MB  Instruction Review Code 1- [employer] Understanding       Education: Flexibility, Balance, Mind/Body Relaxation: - Group verbal and visual presentation with interactive activity on the  components of exercise prescription. Introduces F.I.T.T principle from ACSM for exercise prescriptions. Reviews F.I.T.T. principles of flexibility and balance exercise training including progression. Also discusses the mind body connection.  Reviews various relaxation techniques to help reduce and manage stress (i.e. Deep breathing, progressive muscle relaxation, and visualization). Balance handout provided to take home. Written material given at graduation.   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 HR 107 bpm     Max Ex. BP 124/72     2 Minute Post BP 112/68       Interval HR   1 Minute HR 101     2 Minute HR 105     3 Minute HR 106     4 Minute HR 106     5 Minute HR 107     6 Minute HR 106     2 Minute Post HR 98     Interval Heart Rate? Yes  Interval Oxygen   Interval Oxygen? Yes     Baseline Oxygen Saturation % 99 %     1 Minute Oxygen Saturation % 99 %     1 Minute Liters of Oxygen 3 L     2 Minute Oxygen Saturation % 97 %     2 Minute Liters of Oxygen 3 L     3 Minute Oxygen Saturation % 97 %     3 Minute Liters of Oxygen 3 L     4 Minute Oxygen Saturation % 98 %     4 Minute Liters of Oxygen 3 L     5 Minute Oxygen Saturation % 97 %     5  Minute Liters of Oxygen 3 L     6 Minute Oxygen Saturation % 94 %     6 Minute Liters of Oxygen 3 L     2 Minute Post Oxygen Saturation % 95 %     2 Minute Post Liters of Oxygen 3 L             Oxygen Initial Assessment:  Oxygen Initial Assessment - 08/26/22 1552       Home Oxygen   Home Oxygen Device Home Concentrator;E-Tanks    Sleep Oxygen Prescription Continuous    Liters per minute 2.5    Home Exercise Oxygen Prescription Pulsed    Liters per minute 2.5    Home Resting Oxygen Prescription Pulsed    Liters per minute 2.5    Compliance with Home Oxygen Use Yes      Intervention   Short Term Goals To learn and exhibit compliance with exercise, home and travel O2 prescription;To learn and understand importance of maintaining oxygen saturations>88%;To learn and demonstrate proper use of respiratory medications;To learn and understand importance of monitoring SPO2 with pulse oximeter and demonstrate accurate use of the pulse oximeter.;To learn and demonstrate proper pursed lip breathing techniques or other breathing techniques.     Long  Term Goals Exhibits compliance with exercise, home  and travel O2 prescription;Maintenance of O2 saturations>88%;Compliance with respiratory medication;Verbalizes importance of monitoring SPO2 with pulse oximeter and return demonstration;Exhibits proper breathing techniques, such as pursed lip breathing or other method taught during program session             Oxygen Re-Evaluation:  Oxygen Re-Evaluation     Row Name 09/19/22 1009 10/24/22 0940           Program Oxygen Prescription   Program Oxygen Prescription -- Continuous;E-Tanks      Liters per minute -- 2        Home Oxygen   Home Oxygen Device -- Home Concentrator;E-Tanks      Sleep Oxygen Prescription -- Continuous      Liters per minute -- 2.5      Home Exercise Oxygen Prescription -- Continuous      Liters per minute -- 2.5      Home Resting Oxygen Prescription --  Continuous      Liters per minute -- 2.5      Compliance with Home Oxygen Use -- Yes        Goals/Expected Outcomes   Short Term Goals -- To learn and demonstrate proper pursed lip breathing techniques or other breathing techniques.       Long  Term Goals -- Exhibits proper breathing techniques, such as pursed lip breathing or other method taught during program session      Comments Reviewed PLB technique with pt.  Talked about  how it works and it's importance in maintaining their exercise saturations. Informed patient how to perform the Pursed Lipped breathing technique. Told patient to Inhale through the nose and out the mouth with pursed lips to keep their airways open, help oxygenate them better, practice when at rest or doing strenuous activity. Patient Verbalizes understanding of technique and will work on and be reiterated during [facility].      Goals/Expected Outcomes Short: Become more profiecient at using PLB.   Long: Become independent at using PLB. Short: use PLB with exertion. Long: use PLB on exertion proficiently and independently.               Oxygen Discharge (Final Oxygen Re-Evaluation):  Oxygen Re-Evaluation - 10/24/22 0940       Program Oxygen Prescription   Program Oxygen Prescription Continuous;E-Tanks    Liters per minute 2      Home Oxygen   Home Oxygen Device Home Concentrator;E-Tanks    Sleep Oxygen Prescription Continuous    Liters per minute 2.5    Home Exercise Oxygen Prescription Continuous    Liters per minute 2.5    Home Resting Oxygen Prescription Continuous    Liters per minute 2.5    Compliance with Home Oxygen Use Yes      Goals/Expected Outcomes   Short Term Goals To learn and demonstrate proper pursed lip breathing techniques or other breathing techniques.     Long  Term Goals Exhibits proper breathing techniques, such as pursed lip breathing or other method taught during program session    Comments Informed patient how to perform the Pursed  Lipped breathing technique. Told patient to Inhale through the nose and out the mouth with pursed lips to keep their airways open, help oxygenate them better, practice when at rest or doing strenuous activity. Patient Verbalizes understanding of technique and will work on and be reiterated during [facility].    Goals/Expected Outcomes Short: use PLB with exertion. Long: use PLB on exertion proficiently and independently.             Initial Exercise Prescription:  Initial Exercise Prescription - 09/16/22 1300       Date of Initial Exercise RX and Referring Provider   Date 09/16/22    Referring Provider Dr. Raechel Chute, MD      Oxygen   Oxygen Continuous    Liters 3    Maintain Oxygen Saturation 88% or higher      Treadmill   MPH 1.8    Grade 0.5    Minutes 15    METs 2.5      Recumbant Bike   Level 2    RPM 50    Watts 25    Minutes 15    METs 3.66      NuStep   Level 2    SPM 80    Minutes 15    METs 3.66      Prescription Details   Frequency (times per week) 2    Duration Progress to 30 minutes of continuous aerobic without signs/symptoms of physical distress      Intensity   THRR 40-80% of Max Heartrate 109-143    Ratings of Perceived Exertion 11-13    Perceived Dyspnea 0-4      Progression   Progression Continue to progress workloads to maintain intensity without signs/symptoms of physical distress.      Resistance Training   Training Prescription Yes    Weight 3 lb    Reps 10-15  Perform Capillary Blood Glucose checks as needed.  Exercise Prescription Changes:   Exercise Prescription Changes     Row Name 09/16/22 1300 09/24/22 1400 10/11/22 0900 10/25/22 0900 11/07/22 1500     Response to Exercise   Blood Pressure (Admit) 116/70 100/62 100/60 120/72 96/60   Blood Pressure (Exercise) 124/72 108/62 134/72 126/66 108/62   Blood Pressure (Exit) 112/68 98/62 102/60 102/60 102/64   Heart Rate (Admit) 75 bpm 75 bpm 87 bpm 69 bpm 80  bpm   Heart Rate (Exercise) 107 bpm 92 bpm 96 bpm 91 bpm 100 bpm   Heart Rate (Exit) 98 bpm 80 bpm 80 bpm 69 bpm 84 bpm   Oxygen Saturation (Admit) 99 % 97 % 97 % 97 % 96 %   Oxygen Saturation (Exercise) 94 % 97 % 90 % 85 % 84 %   Oxygen Saturation (Exit) 95 % 98 % 96 % 98 % 92 %   Rating of Perceived Exertion (Exercise) 9 9 13 13 14    Perceived Dyspnea (Exercise) 1 1 1 1 2    Symptoms Fatigue none none none none   Comments Results -- -- -- --   Duration -- Progress to 30 minutes of  aerobic without signs/symptoms of physical distress Progress to 30 minutes of  aerobic without signs/symptoms of physical distress Progress to 30 minutes of  aerobic without signs/symptoms of physical distress Progress to 30 minutes of  aerobic without signs/symptoms of physical distress   Intensity -- THRR unchanged THRR unchanged THRR unchanged THRR unchanged     Progression   Progression -- Continue to progress workloads to maintain intensity without signs/symptoms of physical distress. Continue to progress workloads to maintain intensity without signs/symptoms of physical distress. Continue to progress workloads to maintain intensity without signs/symptoms of physical distress. Continue to progress workloads to maintain intensity without signs/symptoms of physical distress.   Average METs -- 2.5 2.31 2.27 2.03     Resistance Training   Training Prescription -- Yes Yes Yes Yes   Weight -- 3 lb 3 lb 3 lb 3 lb   Reps -- 10-15 10-15 10-15 10-15     Interval Training   Interval Training -- No No No No     Oxygen   Oxygen -- Continuous Continuous Continuous Continuous   Liters -- 3 3 3 2      Treadmill   MPH -- -- -- 1.2 1.4   Grade -- -- -- 0 0   Minutes -- -- -- 15 15   METs -- -- -- 1.92 2.07     Recumbant Bike   Level -- 1 1 2  --   Watts -- 12 12 12  --   Minutes -- 15 15 15  --   METs -- 2.5 2.77 2.78 --     NuStep   Level -- 1 2 3 3    Minutes -- 15 15 15 15    METs -- 2.5 1.4 2.1 2.1      Biostep-RELP   Level -- -- -- 1 1   Minutes -- -- -- 15 15   METs -- -- -- -- 2     Oxygen   Maintain Oxygen Saturation -- 88% or higher 88% or higher 88% or higher 88% or higher            Exercise Comments:   Exercise Comments     Row Name 09/19/22 1008           Exercise Comments First full day of exercise!  Patient was oriented to gym and equipment including functions, settings, policies, and procedures.  Patient's individual exercise prescription and treatment plan were reviewed.  All starting workloads were established based on the results of the 6 minute walk test done at initial orientation visit.  The plan for exercise progression was also introduced and progression will be customized based on patient's performance and goals.                Exercise Goals and Review:   Exercise Goals     Row Name 09/16/22 1342             Exercise Goals   Increase Physical Activity Yes       Intervention Provide advice, education, support and counseling about physical activity/exercise needs.;Develop an individualized exercise prescription for aerobic and resistive training based on initial evaluation findings, risk stratification, comorbidities and participant's personal goals.       Expected Outcomes Short Term: Attend rehab on a regular basis to increase amount of physical activity.;Long Term: Add in home exercise to make exercise part of routine and to increase amount of physical activity.;Long Term: Exercising regularly at least 3-5 days a week.       Increase Strength and Stamina Yes       Intervention Provide advice, education, support and counseling about physical activity/exercise needs.;Develop an individualized exercise prescription for aerobic and resistive training based on initial evaluation findings, risk stratification, comorbidities and participant's personal goals.       Expected Outcomes Short Term: Increase workloads from initial exercise prescription for  resistance, speed, and METs.;Short Term: Perform resistance training exercises routinely during rehab and add in resistance training at home;Long Term: Improve cardiorespiratory fitness, muscular endurance and strength as measured by increased METs and functional capacity ( )       Able to understand and use rate of perceived exertion (RPE) scale Yes       Intervention Provide education and explanation on how to use RPE scale       Expected Outcomes Short Term: Able to use RPE daily in rehab to express subjective intensity level;Long Term:  Able to use RPE to guide intensity level when exercising independently       Able to understand and use Dyspnea scale Yes       Intervention Provide education and explanation on how to use Dyspnea scale       Expected Outcomes Short Term: Able to use Dyspnea scale daily in rehab to express subjective sense of shortness of breath during exertion;Long Term: Able to use Dyspnea scale to guide intensity level when exercising independently       Knowledge and understanding of Target Heart Rate Range (THRR) Yes       Intervention Provide education and explanation of THRR including how the numbers were predicted and where they are located for reference       Expected Outcomes Short Term: Able to state/look up THRR;Long Term: Able to use THRR to govern intensity when exercising independently;Short Term: Able to use daily as guideline for intensity in rehab       Able to check pulse independently Yes       Intervention Provide education and demonstration on how to check pulse in carotid and radial arteries.;Review the importance of being able to check your own pulse for safety during independent exercise       Expected Outcomes Short Term: Able to explain why pulse checking is important during independent exercise;Long Term: Able to check pulse  independently and accurately       Understanding of Exercise Prescription Yes       Intervention Provide education, explanation,  and written materials on patient's individual exercise prescription       Expected Outcomes Short Term: Able to explain program exercise prescription;Long Term: Able to explain home exercise prescription to exercise independently                Exercise Goals Re-Evaluation :  Exercise Goals Re-Evaluation     Row Name 09/19/22 1008 09/24/22 1437 10/11/22 0934 10/25/22 0943 11/07/22 1545     Exercise Goal Re-Evaluation   Exercise Goals Review Increase Physical Activity;Able to understand and use rate of perceived exertion (RPE) scale;Knowledge and understanding of Target Heart Rate Range (THRR);Understanding of Exercise Prescription;Increase Strength and Stamina;Able to check pulse independently;Able to understand and use Dyspnea scale Increase Physical Activity;Increase Strength and Stamina;Understanding of Exercise Prescription Increase Physical Activity;Increase Strength and Stamina;Understanding of Exercise Prescription Increase Physical Activity;Increase Strength and Stamina;Understanding of Exercise Prescription Increase Physical Activity;Increase Strength and Stamina;Understanding of Exercise Prescription   Comments Reviewed RPE  and dyspnea scale, THR and program prescription with pt today.  Pt voiced understanding and was given a copy of goals to take home. Hope is off to a good start in the program. She attended her first and only appointment during this review. During the session she used the T4 nustep and recumbent bike at level 1. We will continue to monitor her progress in the program. Hope is doing well in the program. She continues to do well at level 1 on the recumbent bike and improved to level 2 on the T4 nustep. She also continues to do well with 3 lb hand weights for resistance training. We will continue to monitor her progress in the program. Hope continues to do well in the program. She recently improved to level 2 on the recumbent bike and level 3 on the T4 nustep. She also  began using the treadmill at a speed of 1.2 mph with no incline. We will continue to monitor her progress in the program. Hope is doing well in rehab. She increased her treadmill workload to a speed of 1.4 mph with no incline. She also has stayed consistent on the biostep at level 1 and T4 nustep at level 3. We will continue to monitor her progress in the program.   Expected Outcomes Short: Use RPE daily to regulate intensity.  Long: Follow program prescription in THR. Short: Continue to follow current exercise prescription. Long: Continue exercise to improve strength and stamina. Short: Try level 2 on the recumbent bike. Long: Continue exercise to improve strength and stamina. Short: Continue to walk on treadmill. Long: Continue exercise to improve strength and stamina. Short: Increase to level 2 on the biostep. Long: Continue exercise to improve strength and stamina.            Discharge Exercise Prescription (Final Exercise Prescription Changes):  Exercise Prescription Changes - 11/07/22 1500       Response to Exercise   Blood Pressure (Admit) 96/60    Blood Pressure (Exercise) 108/62    Blood Pressure (Exit) 102/64    Heart Rate (Admit) 80 bpm    Heart Rate (Exercise) 100 bpm    Heart Rate (Exit) 84 bpm    Oxygen Saturation (Admit) 96 %    Oxygen Saturation (Exercise) 84 %    Oxygen Saturation (Exit) 92 %    Rating of Perceived Exertion (Exercise) 14  Perceived Dyspnea (Exercise) 2    Symptoms none    Duration Progress to 30 minutes of  aerobic without signs/symptoms of physical distress    Intensity THRR unchanged      Progression   Progression Continue to progress workloads to maintain intensity without signs/symptoms of physical distress.    Average METs 2.03      Resistance Training   Training Prescription Yes    Weight 3 lb    Reps 10-15      Interval Training   Interval Training No      Oxygen   Oxygen Continuous    Liters 2      Treadmill   MPH 1.4    Grade  0    Minutes 15    METs 2.07      NuStep   Level 3    Minutes 15    METs 2.1      Biostep-RELP   Level 1    Minutes 15    METs 2      Oxygen   Maintain Oxygen Saturation 88% or higher             Nutrition:  Target Goals: Understanding of nutrition guidelines, daily intake of sodium 1500mg , cholesterol 200mg , calories 30% from fat and 7% or less from saturated fats, daily to have 5 or more servings of fruits and vegetables.  Education: All About Nutrition: -Group instruction provided by verbal, written material, interactive activities, discussions, models, and posters to present general guidelines for heart healthy nutrition including fat, fiber, MyPlate, the role of sodium in heart healthy nutrition, utilization of the nutrition label, and utilization of this knowledge for meal planning. Follow up email sent as well. Written material given at graduation.   Biometrics:  Pre Biometrics - 09/16/22 1343       Pre Biometrics   Height 5\' 8"  (1.727 m)    Weight 106 lb 1.6 oz (48.1 kg)    Waist Circumference 26.5 inches    Hip Circumference 33 inches    Waist to Hip Ratio 0.8 %    BMI (Calculated) 16.14    Single Leg Stand 4.8 seconds              Nutrition Therapy Plan and Nutrition Goals:  Nutrition Therapy & Goals - 09/16/22 1332       Nutrition Therapy   RD appointment deferred Yes      Intervention Plan   Intervention Prescribe, educate and counsel regarding individualized specific dietary modifications aiming towards targeted core components such as weight, hypertension, lipid management, diabetes, heart failure and other comorbidities.    Expected Outcomes Short Term Goal: Understand basic principles of dietary content, such as calories, fat, sodium, cholesterol and nutrients.;Short Term Goal: A plan has been developed with personal nutrition goals set during dietitian appointment.;Long Term Goal: Adherence to prescribed nutrition plan.              Nutrition Assessments:  MEDIFICTS Score Key: >=70 Need to make dietary changes  40-70 Heart Healthy Diet <= 40 Therapeutic Level Cholesterol Diet  Flowsheet Row Pulmonary Rehab from 09/16/2022 in [facility] Cardiac and Pulmonary Rehab  Picture Your Plate Total Score on Admission 52      Picture Your Plate Scores: <14 Unhealthy dietary pattern with much room for improvement. 41-50 Dietary pattern unlikely to meet recommendations for good health and room for improvement. 51-60 More healthful dietary pattern, with some room for improvement.  >60 Healthy dietary pattern, although there may be  some specific behaviors that could be improved.   Nutrition Goals Re-Evaluation:  Nutrition Goals Re-Evaluation     Row Name 10/24/22 0941             Goals   Current Weight 111 lb (50.3 kg)       Comment Patient was informed on why it is important to maintain a balanced diet when dealing with Respiratory issues. Explained that it takes a lot of energy to breath and when they are short of breath often they will need to have a good diet to help keep up with the calories they are expending for breathing.       Expected Outcome Short: Choose and plan snacks accordingly to patients caloric intake to improve breathing. Long: Maintain a diet independently that meets their caloric intake to aid in daily shortness of breath.                Nutrition Goals Discharge (Final Nutrition Goals Re-Evaluation):  Nutrition Goals Re-Evaluation - 10/24/22 0941       Goals   Current Weight 111 lb (50.3 kg)    Comment Patient was informed on why it is important to maintain a balanced diet when dealing with Respiratory issues. Explained that it takes a lot of energy to breath and when they are short of breath often they will need to have a good diet to help keep up with the calories they are expending for breathing.    Expected Outcome Short: Choose and plan snacks accordingly to patients caloric intake to  improve breathing. Long: Maintain a diet independently that meets their caloric intake to aid in daily shortness of breath.             Psychosocial: Target Goals: Acknowledge presence or absence of significant depression and/or stress, maximize coping skills, provide positive support system. Participant is able to verbalize types and ability to use techniques and skills needed for reducing stress and depression.   Education: Stress, Anxiety, and Depression - Group verbal and visual presentation to define topics covered.  Reviews how body is impacted by stress, anxiety, and depression.  Also discusses healthy ways to reduce stress and to treat/manage anxiety and depression.  Written material given at graduation.   Education: Sleep Hygiene -Provides group verbal and written instruction about how sleep can affect your health.  Define sleep hygiene, discuss sleep cycles and impact of sleep habits. Review good sleep hygiene tips.    Initial Review & Psychosocial Screening:  Initial Psych Review & Screening - 08/26/22 1631       Initial Review   Current issues with Current Psychotropic Meds      Family Dynamics   Good Support System? Yes   Lives alone but her son lives close by and is great support!     Screening Interventions   Interventions Encouraged to exercise    Expected Outcomes Short Term goal: Utilizing psychosocial counselor, staff and physician to assist with identification of specific Stressors or current issues interfering with healing process. Setting desired goal for each stressor or current issue identified.;Long Term Goal: Stressors or current issues are controlled or eliminated.;Short Term goal: Identification and review with participant of any Quality of Life or Depression concerns found by scoring the questionnaire.;Long Term goal: The participant improves quality of Life and PHQ9 Scores as seen by post scores and/or verbalization of changes             Quality  of Life Scores:  Scores of 19  and below usually indicate a poorer quality of life in these areas.  A difference of  2-3 points is a clinically meaningful difference.  A difference of 2-3 points in the total score of the Quality of Life Index has been associated with significant improvement in overall quality of life, self-image, physical symptoms, and general health in studies assessing change in quality of life.  PHQ-9: Review Flowsheet       10/24/2022 09/16/2022 03/18/2022  Depression screen PHQ 2/9  Decreased Interest 2 2 1   Down, Depressed, Hopeless 1 2 1   PHQ - 2 Score 3 4 2   Altered sleeping 1 1 -  Tired, decreased energy 2 2 -  Change in appetite 0 1 -  Feeling bad or failure about yourself  2 2 -  Trouble concentrating 1 1 -  Moving slowly or fidgety/restless 1 1 -  Suicidal thoughts 0 0 -  PHQ-9 Score 10 12 -  Difficult doing work/chores Somewhat difficult Somewhat difficult -    Details           Interpretation of Total Score  Total Score Depression Severity:  1-4 = Minimal depression, 5-9 = Mild depression, 10-14 = Moderate depression, 15-19 = Moderately severe depression, 20-27 = Severe depression   Psychosocial Evaluation and Intervention:  Psychosocial Evaluation - 08/26/22 1635       Psychosocial Evaluation & Interventions   Interventions Encouraged to exercise with the program and follow exercise prescription    Comments Cassidey "Hope" is coming to pulmonary rehab post emphysema.  She has decreased her tobacco use to now 2 cigarettes per day. She is using nicotene patch and already has cessation material and declines further information at this time. Hope is looking to improve her breathing as she has been having progressive dyspnea, reduce shortness of breath with ADL's and improve muscle strength from rehab. She lives alone and has a son that lives close by that is great support. Hope currently uses home oxygen @ 2.5L/min pulsed. She does have some balance  concerns and uses a cane with ambulation. Advised she bring cane with her to rehab. She has had one fall in the past year, with only injury to her toe. 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 She has no barriers to attending the program and is looking forward to starting rehab.    Expected Outcomes Short: Attend pulmonary rehab for education and exercise.  Long: Develop and maintain positive self care habits.    Continue Psychosocial Services  Follow up required by staff             Psychosocial Re-Evaluation:  Psychosocial Re-Evaluation     Row Name 10/24/22 0945             Psychosocial Re-Evaluation   Current issues with Current Stress Concerns;History of Depression;Current Psychotropic Meds       Comments Reviewed patient health questionnaire (PHQ-9) with patient for follow up. Previously, patients score indicated signs/symptoms of depression.  Reviewed to see if patient is improving symptom wise while in program.  Score improved and patient states that it is because she is able to come to exercise class.       Expected Outcomes Short: Continue to attend [facility] regularly for regular exercise and social engagement. Long: Continue to improve symptoms and manage a positive mental state.       Interventions Encouraged to attend Pulmonary Rehabilitation for the exercise  Continue Psychosocial Services  Follow up required by staff                Psychosocial Discharge (Final Psychosocial Re-Evaluation):  Psychosocial Re-Evaluation - 10/24/22 0945       Psychosocial Re-Evaluation   Current issues with Current Stress Concerns;History of Depression;Current Psychotropic Meds    Comments Reviewed patient health questionnaire (PHQ-9) with patient for follow up. Previously, patients score indicated signs/symptoms of depression.  Reviewed to see if patient is improving symptom wise while in program.  Score improved and  patient states that it is because she is able to come to exercise class.    Expected Outcomes Short: Continue to attend [facility] regularly for regular exercise and social engagement. Long: Continue to improve symptoms and manage a positive mental state.    Interventions Encouraged to attend Pulmonary Rehabilitation for the exercise    Continue Psychosocial Services  Follow up required by staff             Education: Education Goals: Education classes will be provided on a weekly basis, covering required topics. Participant will state understanding/return demonstration of topics presented.  Learning Barriers/Preferences:   General Pulmonary Education Topics:  Infection Prevention: - Provides verbal and written material to individual with discussion of infection control including proper hand washing and proper equipment cleaning during exercise session. Flowsheet Row Pulmonary Rehab from 11/07/2022 in [facility] Cardiac and Pulmonary Rehab  Date 09/16/22  Educator NT  Instruction Review Code 1- Verbalizes Understanding       Falls Prevention: - Provides verbal and written material to individual with discussion of falls prevention and safety. Flowsheet Row Pulmonary Rehab from 11/07/2022 in [facility] Cardiac and Pulmonary Rehab  Date 09/16/22  Educator NT  Instruction Review Code 1- Verbalizes Understanding       Chronic Lung Disease Review: - Group verbal instruction with posters, models, PowerPoint presentations and videos,  to review new updates, new respiratory medications, new advancements in procedures and treatments. Providing information on websites and "800" numbers for continued self-education. Includes information about supplement oxygen, available portable oxygen systems, continuous and intermittent flow rates, oxygen safety, concentrators, and Medicare reimbursement for oxygen. Explanation of Pulmonary Drugs, including class, frequency, complications, importance of spacers,  rinsing mouth after steroid MDI's, and proper cleaning methods for nebulizers. Review of basic lung anatomy and physiology related to function, structure, and complications of lung disease. Review of risk factors. Discussion about methods for diagnosing sleep apnea and types of masks and machines for OSA. Includes a review of the use of types of environmental controls: home humidity, furnaces, filters, dust mite/pet prevention, HEPA vacuums. Discussion about weather changes, air quality and the benefits of nasal washing. Instruction on Warning signs, infection symptoms, calling MD promptly, preventive modes, and value of vaccinations. Review of effective airway clearance, coughing and/or vibration techniques. Emphasizing that all should Create an Action Plan. Written material given at graduation. Flowsheet Row Pulmonary Rehab from 11/07/2022 in [facility] Cardiac and Pulmonary Rehab  Education need identified 09/16/22  Date 10/03/22  Educator [facility]  Instruction Review Code 1- Verbalizes Understanding       AED/CPR: - Group verbal and written instruction with the use of models to demonstrate the basic use of the AED with the basic ABC's of resuscitation.    Anatomy and Cardiac Procedures: - Group verbal and visual presentation and models provide information about basic cardiac anatomy and function. Reviews the testing methods done to diagnose heart disease and the outcomes of the test results. Describes  the treatment choices: Medical Management, Angioplasty, or Coronary Bypass Surgery for treating various heart conditions including Myocardial Infarction, Angina, Valve Disease, and Cardiac Arrhythmias.  Written material given at graduation. Flowsheet Row Pulmonary Rehab from 11/07/2022 in [facility] Cardiac and Pulmonary Rehab  Education need identified 09/16/22  Date 09/19/22  Educator SB  Instruction Review Code 1- Verbalizes Understanding       Medication Safety: - Group verbal and visual instruction to  review commonly prescribed medications for heart and lung disease. Reviews the medication, class of the drug, and side effects. Includes the steps to properly store meds and maintain the prescription regimen.  Written material given at graduation. Flowsheet Row Pulmonary Rehab from 11/07/2022 in [facility] Cardiac and Pulmonary Rehab  Education need identified 09/16/22       Other: -Provides group and verbal instruction on various topics (see comments)   Knowledge Questionnaire Score:  Knowledge Questionnaire Score - 09/16/22 1331       Knowledge Questionnaire Score   Pre Score 12/18              Core Components/Risk Factors/Patient Goals at Admission:  Personal Goals and Risk Factors at Admission - 08/26/22 1633       Core Components/Risk Factors/Patient Goals on Admission    Weight Management Yes    Intervention Weight Management: Develop a combined nutrition and exercise program designed to reach desired caloric intake, while maintaining appropriate intake of nutrient and fiber, sodium and fats, and appropriate energy expenditure required for the weight goal.;Weight Management: Provide education and appropriate resources to help participant work on and attain dietary goals.    Admit Weight 109 lb (49.4 kg)    Expected Outcomes Short Term: Continue to assess and modify interventions until short term weight is achieved;Long Term: Adherence to nutrition and physical activity/exercise program aimed toward attainment of established weight goal;Weight Maintenance: Understanding of the daily nutrition guidelines, which includes 25-35% calories from fat, 7% or less cal from saturated fats, less than 200mg  cholesterol, less than 1.5gm of sodium, & 5 or more servings of fruits and vegetables daily;Understanding recommendations for meals to include 15-35% energy as protein, 25-35% energy from fat, 35-60% energy from carbohydrates, less than 200mg  of dietary cholesterol, 20-35 gm of total fiber  daily;Understanding of distribution of calorie intake throughout the day with the consumption of 4-5 meals/snacks    Tobacco Cessation Yes    Number of packs per day 2 cigarettes per day    Intervention Assist the participant in steps to quit. Provide individualized education and counseling about committing to Tobacco Cessation, relapse prevention, and pharmacological support that can be provided by physician.    Improve shortness of breath with ADL's Yes    Intervention Provide education, individualized exercise plan and daily activity instruction to help decrease symptoms of SOB with activities of daily living.    Expected Outcomes Short Term: Improve cardiorespiratory fitness to achieve a reduction of symptoms when performing ADLs             Education:Diabetes - Individual verbal and written instruction to review signs/symptoms of diabetes, desired ranges of glucose level fasting, after meals and with exercise. Acknowledge that pre and post exercise glucose checks will be done for 3 sessions at entry of program.   Know Your Numbers and Heart Failure: - Group verbal and visual instruction to discuss disease risk factors for cardiac and pulmonary disease and treatment options.  Reviews associated critical values for Overweight/Obesity, Hypertension, Cholesterol, and Diabetes.  Discusses basics of heart  failure: signs/symptoms and treatments.  Introduces Heart Failure Zone chart for action plan for heart failure.  Written material given at graduation. Flowsheet Row Pulmonary Rehab from 11/07/2022 in [facility] Cardiac and Pulmonary Rehab  Date 10/10/22  Educator SB  Instruction Review Code 1- Verbalizes Understanding       Core Components/Risk Factors/Patient Goals Review:   Goals and Risk Factor Review     Row Name 10/24/22 [phone]             Core Components/Risk Factors/Patient Goals Review   Personal Goals Review Improve shortness of breath with ADL's       Review Spoke to patient  about their shortness of breath and what they can do to improve. Patient has been informed of breathing techniques when starting the program. Patient is informed to tell staff if they have had any med changes and that certain meds they are taking or not taking can be causing shortness of breath.       Expected Outcomes Short: Attend [facility] regularly to improve shortness of breath with ADL's. Long: maintain independence with ADL's                Core Components/Risk Factors/Patient Goals at Discharge (Final Review):   Goals and Risk Factor Review - 10/24/22 0942       Core Components/Risk Factors/Patient Goals Review   Personal Goals Review Improve shortness of breath with ADL's    Review Spoke to patient about their shortness of breath and what they can do to improve. Patient has been informed of breathing techniques when starting the program. Patient is informed to tell staff if they have had any med changes and that certain meds they are taking or not taking can be causing shortness of breath.    Expected Outcomes Short: Attend [facility] regularly to improve shortness of breath with ADL's. Long: maintain independence with ADL's             ITP Comments:  ITP Comments     Row Name 08/26/22 1628 09/16/22 1329 09/19/22 1008 10/09/22 1239 11/06/22 0854   ITP Comments Initial phone call completed. Dx can be found in [facility] 7/16. EP orientation scheduled for 8/22 at 2:30 pm. Completed and gym orientation. Initial ITP created and sent for review to Dr. Vida Rigger, Medical Director. First full day of exercise!  Patient was oriented to gym and equipment including functions, settings, policies, and procedures.  Patient's individual exercise prescription and treatment plan were reviewed.  All starting workloads were established based on the results of the 6 minute walk test done at initial orientation visit.  The plan for exercise progression was also introduced and progression will be  customized based on patient's performance and goals. 30 Day review completed. Medical Director ITP review done, changes made as directed, and signed approval by Medical Director.    new to program 30 Day review completed. Medical Director ITP review done, changes made as directed, and signed approval by Medical Director.    Row Name 11/27/22 0932           ITP Comments 30 Day review completed. Medical Director ITP review done, changes made as directed, and signed approval by Medical Director.                Comments:

## 2022-12-03 ENCOUNTER — Encounter: Payer: 59 | Admitting: *Deleted

## 2022-12-03 DIAGNOSIS — J439 Emphysema, unspecified: Secondary | ICD-10-CM | POA: Diagnosis not present

## 2022-12-03 NOTE — Progress Notes (Signed)
Daily Session Note  Patient Details  Name: Tammy Maxwell MRN: [id number] Date of Birth: Oct 30, 1960 Referring Provider:   Flowsheet Row Pulmonary Rehab from 09/16/2022 in [facility] Cardiac and Pulmonary Rehab  Referring Provider Dr. Raechel Chute, MD       Encounter Date: 12/03/2022  Check In:  Session Check In - 12/03/22 0949       Check-In   Supervising physician immediately available to respond to emergencies See telemetry face sheet for immediately available ER MD    Location [facility]-Cardiac & Pulmonary Rehab    Staff Present Cora Collum, RN, BSN, CCRP;Noah Tickle, BS, Exercise Physiologist;Meredith Jewel Baize, RN BSN;Margaret Best, MS, Exercise Physiologist;Maxon Conetta BS, , Exercise Physiologist    Virtual Visit No    Medication changes reported     No    Fall or balance concerns reported    No    Warm-up and Cool-down Performed on first and last piece of equipment    Resistance Training Performed Yes    VAD Patient? No    PAD/SET Patient? No      Pain Assessment   Currently in Pain? No/denies                Social History   Tobacco Use  Smoking Status Every Day   Current packs/day: 0.50   Average packs/day: 0.5 packs/day for 47.0 years (23.5 ttl pk-yrs)   Types: Cigarettes  Smokeless Tobacco Never  Tobacco Comments   2 cigs daily- 07/23/2022    Goals Met:  Proper associated with RPD/PD & O2 Sat Independence with exercise equipment Exercise tolerated well No report of concerns or symptoms today  Goals Unmet:  Not Applicable  Comments: Pt able to follow exercise prescription today without complaint.  Will continue to monitor for progression.    Dr. Bethann Punches is Medical Director for [facility] Cardiac Rehabilitation.  Dr. Vida Rigger is Medical Director for [facility] Pulmonary Rehabilitation.

## 2022-12-10 ENCOUNTER — Encounter: Payer: 59 | Attending: Student in an Organized Health Care Education/Training Program | Admitting: *Deleted

## 2022-12-10 DIAGNOSIS — J439 Emphysema, unspecified: Secondary | ICD-10-CM | POA: Diagnosis present

## 2022-12-10 NOTE — Progress Notes (Signed)
Daily Session Note  Patient Details  Name: Tammy Maxwell MRN: [id number] Date of Birth: 1960-10-27 Referring Provider:   Flowsheet Row Pulmonary Rehab from 09/16/2022 in [facility] Cardiac and Pulmonary Rehab  Referring Provider Dr. Raechel Chute, MD       Encounter Date: 12/10/2022  Check In:  Session Check In - 12/10/22 0937       Check-In   Supervising physician immediately available to respond to emergencies See telemetry face sheet for immediately available ER MD    Location [facility]-Cardiac & Pulmonary Rehab    Staff Present Cora Collum, RN, BSN, CCRP;Margaret Best, MS, Exercise Physiologist;Maxon Conetta BS, , Exercise Physiologist;Noah Tickle, BS, Exercise Physiologist    Virtual Visit No    Medication changes reported     No    Fall or balance concerns reported    No    Warm-up and Cool-down Performed on first and last piece of equipment    Resistance Training Performed Yes    VAD Patient? No    PAD/SET Patient? No      Pain Assessment   Currently in Pain? No/denies                Social History   Tobacco Use  Smoking Status Every Day   Current packs/day: 0.50   Average packs/day: 0.5 packs/day for 47.0 years (23.5 ttl pk-yrs)   Types: Cigarettes  Smokeless Tobacco Never  Tobacco Comments   2 cigs daily- 07/23/2022    Goals Met:  Proper associated with RPD/PD & O2 Sat [place] with exercise equipment Exercise tolerated well No report of concerns or symptoms today  Goals Unmet:  Not Applicable  Comments: Pt able to follow exercise prescription today without complaint.  Will continue to monitor for progression.    Dr. Bethann Punches is Medical Director for [facility] Cardiac Rehabilitation.  Dr. Vida Rigger is Medical Director for [facility] Pulmonary Rehabilitation.

## 2022-12-12 ENCOUNTER — Telehealth: Payer: Self-pay | Admitting: *Deleted

## 2022-12-12 ENCOUNTER — Encounter: Payer: 59 | Admitting: *Deleted

## 2022-12-12 DIAGNOSIS — J439 Emphysema, unspecified: Secondary | ICD-10-CM

## 2022-12-12 NOTE — Telephone Encounter (Signed)
I received a staff message that pt had not received her CT results from 09/2022. A result letter was sent to pt on 10/14/22.  I attempted to contact pt but had to leave a message with our call back number to discuss results.

## 2022-12-12 NOTE — Progress Notes (Signed)
Daily Session Note  Patient Details  Name: Tammy Maxwell MRN: [id number] Date of Birth: 1960-04-07 Referring Provider:   Flowsheet Row Pulmonary Rehab from 09/16/2022 in [facility] Cardiac and Pulmonary Rehab  Referring Provider Dr. Raechel Chute, MD       Encounter Date: 12/12/2022  Check In:  Session Check In - 12/12/22 1045       Check-In   Supervising physician immediately available to respond to emergencies See telemetry face sheet for immediately available ER MD    Location [facility]-Cardiac & Pulmonary Rehab    Staff Present Cora Collum, RN, BSN, CCRP;Joseph Hood, RCP,RRT,BSRT;Noah Tickle, [place], Exercise Physiologist    Virtual Visit No    Medication changes reported     No    Fall or balance concerns reported    No    Warm-up and Cool-down Performed on first and last piece of equipment    Resistance Training Performed Yes    VAD Patient? No      Pain Assessment   Currently in Pain? No/denies               Exercise Prescription Changes - 12/12/22 1000       Home Exercise Plan   Plans to continue exercise at Home (comment)   Walking outside and using 3 lb hand weights   Frequency Add 3 additional days to program exercise sessions.    Initial Home Exercises Provided 12/12/22      Oxygen   Maintain Oxygen Saturation 88% or higher             Social History   Tobacco Use  Smoking Status Every Day   Current packs/day: 0.50   Average packs/day: 0.5 packs/day for 47.0 years (23.5 ttl pk-yrs)   Types: Cigarettes  Smokeless Tobacco Never  Tobacco Comments   2 cigs daily- 07/23/2022    Goals Met:  Proper associated with RPD/PD & O2 Sat [place] with exercise equipment Exercise tolerated well No report of concerns or symptoms today  Goals Unmet:  Not Applicable  Comments: Pt able to follow exercise prescription today without complaint.  Will continue to monitor for progression.    Dr. Bethann Punches is Medical Director for [facility] Cardiac  Rehabilitation.  Dr. Vida Rigger is Medical Director for [facility] Pulmonary Rehabilitation.

## 2022-12-17 ENCOUNTER — Encounter: Payer: 59 | Admitting: *Deleted

## 2022-12-17 DIAGNOSIS — J439 Emphysema, unspecified: Secondary | ICD-10-CM | POA: Diagnosis not present

## 2022-12-17 NOTE — Progress Notes (Signed)
Daily Session Note  Patient Details  Name: Tovah Loughridge MRN: [id number] Date of Birth: [birth year] Referring Provider:   Flowsheet Row Pulmonary Rehab from 09/16/2022 in [facility] Cardiac and Pulmonary Rehab  Referring Provider Dr. Raechel Chute, MD       Encounter Date: 12/17/2022  Check In:  Session Check In - 12/17/22 1002       Check-In   Supervising physician immediately available to respond to emergencies See telemetry face sheet for immediately available ER MD    Location [facility]-Cardiac & Pulmonary Rehab    Staff Present Cora Collum, RN, BSN, CCRP;Margaret Best, MS, Exercise Physiologist;Maxon Conetta BS, , Exercise Physiologist;Noah Tickle, BS, Exercise Physiologist    Virtual Visit No    Medication changes reported     No    Fall or balance concerns reported    No    Warm-up and Cool-down Performed on first and last piece of equipment    Resistance Training Performed Yes    VAD Patient? No    PAD/SET Patient? No      Pain Assessment   Currently in Pain? No/denies                Social History   Tobacco Use  Smoking Status Every Day   Current packs/day: 0.50   Average packs/day: 0.5 packs/day for 47.0 years (23.5 ttl pk-yrs)   Types: Cigarettes  Smokeless Tobacco Never  Tobacco Comments   2 cigs daily- 07/23/2022    Goals Met:  Proper associated with RPD/PD & O2 Sat [place] with exercise equipment Exercise tolerated well No report of concerns or symptoms today  Goals Unmet:  Not Applicable  Comments: Pt able to follow exercise prescription today without complaint.  Will continue to monitor for progression.    Dr. Bethann Punches is Medical Director for [facility] Cardiac Rehabilitation.  Dr. Vida Rigger is Medical Director for [facility] Pulmonary Rehabilitation.

## 2022-12-19 ENCOUNTER — Encounter: Payer: 59 | Admitting: *Deleted

## 2022-12-23 ENCOUNTER — Ambulatory Visit: Payer: 59 | Admitting: Student in an Organized Health Care Education/Training Program

## 2022-12-25 ENCOUNTER — Encounter: Payer: Self-pay | Admitting: *Deleted

## 2022-12-25 DIAGNOSIS — J439 Emphysema, unspecified: Secondary | ICD-10-CM

## 2022-12-25 NOTE — Progress Notes (Signed)
Pulmonary Individual Treatment Plan  Patient Details  Name: Tammy Maxwell MRN: [id number] Date of Birth: July 18, 1960 Referring Provider:   Flowsheet Row Pulmonary Rehab from 09/16/2022 in [facility] Cardiac and Pulmonary Rehab  Referring Provider Dr. Raechel Chute, MD       Initial Encounter Date:  Flowsheet Row Pulmonary Rehab from 09/16/2022 in [facility] Cardiac and Pulmonary Rehab  Date 09/16/22       Visit Diagnosis: Pulmonary emphysema, unspecified emphysema type (HCC)  Patient's Home Medications on Admission:  Current Outpatient Medications:    albuterol (PROVENTIL) (2.5 MG/3ML) 0.083% nebulizer solution, Take 2.5 mg by nebulization 4 (four) times daily., Disp: , Rfl:    aspirin 81 MG chewable tablet, Chew 81 mg by mouth daily., Disp: , Rfl:    budesonide-formoterol (SYMBICORT) 160-4.5 MCG/ACT inhaler, Inhale 2 puffs into the lungs 2 (two) times daily., Disp: , Rfl:    buPROPion (WELLBUTRIN SR) 150 MG 12 hr tablet, Take 150 mg by mouth 2 (two) times daily., Disp: , Rfl:    clonazePAM (KLONOPIN) 1 MG tablet, Take 1 mg by mouth at bedtime., Disp: , Rfl:    cyanocobalamin (VITAMIN B12) 500 MCG tablet, Take 500 mcg by mouth daily., Disp: , Rfl:    ferrous sulfate 324 MG TBEC, Take 324 mg by mouth., Disp: , Rfl:    IPRATROPIUM BROMIDE HFA IN, Inhale 2 puffs into the lungs 4 (four) times daily., Disp: , Rfl:    isosorbide mononitrate (IMDUR) 30 MG 24 hr tablet, Take 0.5 tablets (15 mg total) by mouth daily. PLEASE CALL OFFICE TO SCHEDULE APPOINTMENT PRIOR TO NEXT REFILL, Disp: 45 tablet, Rfl: 0   levothyroxine (SYNTHROID) 50 MCG tablet, Take 50 mcg by mouth daily., Disp: , Rfl:    meloxicam (MOBIC) 15 MG tablet, Take 15 mg by mouth at bedtime., Disp: , Rfl:    naloxone (NARCAN) nasal spray 4 mg/0.1 mL, SMARTSIG:Both Nares, Disp: , Rfl:    nicotine (NICODERM CQ - DOSED IN MG/24 HOURS) 14 mg/24hr patch, Place 14 mg onto the skin daily., Disp: , Rfl:    nitrofurantoin, macrocrystal-monohydrate,  (MACROBID) 100 MG capsule, Take 100 mg by mouth 2 (two) times daily., Disp: , Rfl:    nitroGLYCERIN (NITROSTAT) 0.3 MG SL tablet, Place 0.3 mg under the tongue every 5 (five) minutes as needed for chest pain., Disp: , Rfl:    omeprazole (PRILOSEC) 40 MG capsule, Take 40 mg by mouth 2 (two) times daily., Disp: , Rfl:    PARoxetine (PAXIL) 20 MG tablet, Take 20 mg by mouth at bedtime., Disp: , Rfl:    pravastatin (PRAVACHOL) 10 MG tablet, Take 10 mg by mouth daily., Disp: , Rfl:    QUEtiapine (SEROQUEL) 400 MG tablet, Take 400 mg by mouth at bedtime., Disp: , Rfl:    sucralfate (CARAFATE) 1 GM/10ML suspension, Take 2 g by mouth 3 times/day as needed-between meals & bedtime., Disp: , Rfl:    SUMAtriptan (IMITREX) 100 MG tablet, Take 100 mg by mouth every 2 (two) hours as needed for migraine. May repeat in 2 hours if headache persists or recurs., Disp: , Rfl:    Tiotropium Bromide Monohydrate (SPIRIVA RESPIMAT) 2.5 MCG/ACT AERS, Inhale 2 puffs into the lungs daily., Disp: 4 g, Rfl: 11   traMADol (ULTRAM) 50 MG tablet, Take 50 mg by mouth 4 (four) times daily., Disp: , Rfl:    Vitamin D, Ergocalciferol, (DRISDOL) 1.25 MG (50000 UNIT) CAPS capsule, Take 50,000 Units by mouth once a week., Disp: , Rfl:   Past  Medical History: Past Medical History:  Diagnosis Date   Anemia    Arthritis    Bronchitis    Chest pain    COPD (chronic obstructive pulmonary disease) (HCC)    Depression    Dysrhythmia    GERD (gastroesophageal reflux disease)    Headache    Heart murmur    History of hiatal hernia    Pneumonia    Pneumothorax    Wears dentures    full upper    Tobacco Use: Social History   Tobacco Use  Smoking Status Every Day   Current packs/day: 0.50   Average packs/day: 0.5 packs/day for 47.0 years (23.5 ttl pk-yrs)   Types: Cigarettes  Smokeless Tobacco Never  Tobacco Comments   2 cigs daily- 07/23/2022    Labs: Review Flowsheet       Latest Ref Rng & Units 02/13/2022  Labs for  [facility] Cardiac and Pulmonary Rehab  Cholestrol 0 - 200 mg/dL 366   LDL (calc) 0 - 99 mg/dL 97   HDL-C >44 mg/dL 73   Trlycerides <034 mg/dL 87      Pulmonary Assessment Scores:  Pulmonary Assessment Scores     Row Name 09/16/22 1336         ADL UCSD   ADL Phase Entry     SOB Score total 82     Rest 3     Walk 4     Stairs 5     Bath 4     Dress 3     Shop 0       CAT Score   CAT Score 36       mMRC Score   mMRC Score 3              UCSD: Self-administered rating of dyspnea associated with activities of daily living (ADLs) 6-point scale (0 = "not at all" to 5 = "maximal or unable to do because of breathlessness")  Scoring Scores range from 0 to 120.  Minimally important difference is 5 units  CAT: CAT can identify the health impairment of COPD patients and is better correlated with disease progression.  CAT has a scoring range of zero to 40. The CAT score is classified into four groups of low (less than 10), medium (10 - 20), high (21-30) and very high (31-40) based on the impact level of disease on health status. A CAT score over 10 suggests significant symptoms.  A worsening CAT score could be explained by an exacerbation, poor medication adherence, poor inhaler technique, or progression of COPD or comorbid conditions.  CAT MCID is 2 points  mMRC: mMRC (Modified Medical Research Council) Dyspnea Scale is used to assess the degree of baseline functional disability in patients of respiratory disease due to dyspnea. No minimal important difference is established. A decrease in score of 1 point or greater is considered a positive change.   Pulmonary Function Assessment:   Exercise Target Goals: Exercise Program Goal: Individual exercise prescription set using results from initial 6 min walk test and THRR while considering  patient's activity barriers and safety.   Exercise Prescription Goal: Initial exercise prescription builds to 30-45 minutes a day of aerobic  activity, 2-3 days per week.  Home exercise guidelines will be given to patient during program as part of exercise prescription that the participant will acknowledge.  Education: Aerobic Exercise: - Group verbal and visual presentation on the components of exercise prescription. Introduces F.I.T.T principle from ACSM for exercise prescriptions.  Reviews  F.I.T.T. principles of aerobic exercise including progression. Written material given at graduation. Flowsheet Row Pulmonary Rehab from 12/03/2022 in [facility] Cardiac and Pulmonary Rehab  Education need identified 09/16/22  Date 11/07/22  Educator MB  Instruction Review Code 1- [employer] Understanding       Education: Resistance Exercise: - Group verbal and visual presentation on the components of exercise prescription. Introduces F.I.T.T principle from ACSM for exercise prescriptions  Reviews F.I.T.T. principles of resistance exercise including progression. Written material given at graduation.    Education: Exercise & Equipment Safety: - Individual verbal instruction and demonstration of equipment use and safety with use of the equipment. Flowsheet Row Pulmonary Rehab from 12/03/2022 in [facility] Cardiac and Pulmonary Rehab  Date 09/16/22  Educator NT  Instruction Review Code 1- Verbalizes Understanding       Education: Exercise Physiology & General Exercise Guidelines: - Group verbal and written instruction with models to review the exercise physiology of the cardiovascular system and associated critical values. Provides general exercise guidelines with specific guidelines to those with heart or lung disease.  Flowsheet Row Pulmonary Rehab from 12/03/2022 in [facility] Cardiac and Pulmonary Rehab  Date 10/24/22  Educator MB  Instruction Review Code 1- [employer] Understanding       Education: Flexibility, Balance, Mind/Body Relaxation: - Group verbal and visual presentation with interactive activity on the components of exercise  prescription. Introduces F.I.T.T principle from ACSM for exercise prescriptions. Reviews F.I.T.T. principles of flexibility and balance exercise training including progression. Also discusses the mind body connection.  Reviews various relaxation techniques to help reduce and manage stress (i.e. Deep breathing, progressive muscle relaxation, and visualization). Balance handout provided to take home. Written material given at graduation.   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 HR 107 bpm     Max Ex. BP 124/72     2 Minute Post BP 112/68       Interval HR   1 Minute HR 101     2 Minute HR 105     3 Minute HR 106     4 Minute HR 106     5 Minute HR 107     6 Minute HR 106     2 Minute Post HR 98     Interval Heart Rate? Yes       Interval Oxygen   Interval Oxygen?  Yes     Baseline Oxygen Saturation % 99 %     1 Minute Oxygen Saturation % 99 %     1 Minute Liters of Oxygen 3 L     2 Minute Oxygen Saturation % 97 %     2 Minute Liters of Oxygen 3 L     3 Minute Oxygen Saturation % 97 %     3 Minute Liters of Oxygen 3 L     4 Minute Oxygen Saturation % 98 %     4 Minute Liters of Oxygen 3 L     5 Minute Oxygen Saturation % 97 %     5 Minute Liters of Oxygen  3 L     6 Minute Oxygen Saturation % 94 %     6 Minute Liters of Oxygen 3 L     2 Minute Post Oxygen Saturation % 95 %     2 Minute Post Liters of Oxygen 3 L             Oxygen Initial Assessment:  Oxygen Initial Assessment - 08/26/22 1552       Home Oxygen   Home Oxygen Device Home Concentrator;E-Tanks    Sleep Oxygen Prescription Continuous    Liters per minute 2.5    Home Exercise Oxygen Prescription Pulsed    Liters per minute 2.5    Home Resting Oxygen Prescription Pulsed    Liters per minute 2.5    Compliance with Home Oxygen Use Yes      Intervention   Short Term Goals To learn and exhibit compliance with exercise, home and travel O2 prescription;To learn and understand importance of maintaining oxygen saturations>88%;To learn and demonstrate proper use of respiratory medications;To learn and understand importance of monitoring SPO2 with pulse oximeter and demonstrate accurate use of the pulse oximeter.;To learn and demonstrate proper pursed lip breathing techniques or other breathing techniques.     Long  Term Goals Exhibits compliance with exercise, home  and travel O2 prescription;Maintenance of O2 saturations>88%;Compliance with respiratory medication;Verbalizes importance of monitoring SPO2 with pulse oximeter and return demonstration;Exhibits proper breathing techniques, such as pursed lip breathing or other method taught during program session             Oxygen Re-Evaluation:  Oxygen Re-Evaluation     Row Name 09/19/22 1009 10/24/22 0940           Program Oxygen Prescription   Program Oxygen Prescription -- Continuous;E-Tanks      Liters per minute -- 2        Home Oxygen   Home Oxygen Device -- Home Concentrator;E-Tanks      Sleep Oxygen Prescription -- Continuous      Liters per minute -- 2.5      Home Exercise Oxygen Prescription -- Continuous      Liters per minute -- 2.5      Home Resting Oxygen Prescription -- Continuous      Liters per  minute -- 2.5      Compliance with Home Oxygen Use -- Yes        Goals/Expected Outcomes   Short Term Goals -- To learn and demonstrate proper pursed lip breathing techniques or other breathing techniques.       Long  Term Goals -- Exhibits proper breathing techniques, such as pursed lip breathing or other method taught during program session      Comments Reviewed PLB technique with pt.  Talked about how it works and it's importance  in maintaining their exercise saturations. Informed patient how to perform the Pursed Lipped breathing technique. Told patient to Inhale through the nose and out the mouth with pursed lips to keep their airways open, help oxygenate them better, practice when at rest or doing strenuous activity. Patient Verbalizes understanding of technique and will work on and be reiterated during [facility].      Goals/Expected Outcomes Short: Become more profiecient at using PLB.   Long: Become independent at using PLB. Short: use PLB with exertion. Long: use PLB on exertion proficiently and independently.               Oxygen Discharge (Final Oxygen Re-Evaluation):  Oxygen Re-Evaluation - 10/24/22 0940       Program Oxygen Prescription   Program Oxygen Prescription Continuous;E-Tanks    Liters per minute 2      Home Oxygen   Home Oxygen Device Home Concentrator;E-Tanks    Sleep Oxygen Prescription Continuous    Liters per minute 2.5    Home Exercise Oxygen Prescription Continuous    Liters per minute 2.5    Home Resting Oxygen Prescription Continuous    Liters per minute 2.5    Compliance with Home Oxygen Use Yes      Goals/Expected Outcomes   Short Term Goals To learn and demonstrate proper pursed lip breathing techniques or other breathing techniques.     Long  Term Goals Exhibits proper breathing techniques, such as pursed lip breathing or other method taught during program session    Comments Informed patient how to perform the Pursed Lipped breathing technique.  Told patient to Inhale through the nose and out the mouth with pursed lips to keep their airways open, help oxygenate them better, practice when at rest or doing strenuous activity. Patient Verbalizes understanding of technique and will work on and be reiterated during [facility].    Goals/Expected Outcomes Short: use PLB with exertion. Long: use PLB on exertion proficiently and independently.             Initial Exercise Prescription:  Initial Exercise Prescription - 09/16/22 1300       Date of Initial Exercise RX and Referring Provider   Date 09/16/22    Referring Provider Dr. Raechel Chute, MD      Oxygen   Oxygen Continuous    Liters 3    Maintain Oxygen Saturation 88% or higher      Treadmill   MPH 1.8    Grade 0.5    Minutes 15    METs 2.5      Recumbant Bike   Level 2    RPM 50    Watts 25    Minutes 15    METs 3.66      NuStep   Level 2    SPM 80    Minutes 15    METs 3.66      Prescription Details   Frequency (times per week) 2    Duration Progress to 30 minutes of continuous aerobic without signs/symptoms of physical distress      Intensity   THRR 40-80% of Max Heartrate 109-143    Ratings of Perceived Exertion 11-13    Perceived Dyspnea 0-4      Progression   Progression Continue to progress workloads to maintain intensity without signs/symptoms of physical distress.      Resistance Training   Training Prescription Yes    Weight 3 lb    Reps 10-15  Perform Capillary Blood Glucose checks as needed.  Exercise Prescription Changes:   Exercise Prescription Changes     Row Name 09/16/22 1300 09/24/22 1400 10/11/22 0900 10/25/22 0900 11/07/22 1500     Response to Exercise   Blood Pressure (Admit) 116/70 100/62 100/60 120/72 96/60   Blood Pressure (Exercise) 124/72 108/62 134/72 126/66 108/62   Blood Pressure (Exit) 112/68 98/62 102/60 102/60 102/64   Heart Rate (Admit) 75 bpm 75 bpm 87 bpm 69 bpm 80 bpm   Heart Rate  (Exercise) 107 bpm 92 bpm 96 bpm 91 bpm 100 bpm   Heart Rate (Exit) 98 bpm 80 bpm 80 bpm 69 bpm 84 bpm   Oxygen Saturation (Admit) 99 % 97 % 97 % 97 % 96 %   Oxygen Saturation (Exercise) 94 % 97 % 90 % 85 % 84 %   Oxygen Saturation (Exit) 95 % 98 % 96 % 98 % 92 %   Rating of Perceived Exertion (Exercise) 9 9 13 13 14    Perceived Dyspnea (Exercise) 1 1 1 1 2    Symptoms Fatigue none none none none   Comments Results -- -- -- --   Duration -- Progress to 30 minutes of  aerobic without signs/symptoms of physical distress Progress to 30 minutes of  aerobic without signs/symptoms of physical distress Progress to 30 minutes of  aerobic without signs/symptoms of physical distress Progress to 30 minutes of  aerobic without signs/symptoms of physical distress   Intensity -- THRR unchanged THRR unchanged THRR unchanged THRR unchanged     Progression   Progression -- Continue to progress workloads to maintain intensity without signs/symptoms of physical distress. Continue to progress workloads to maintain intensity without signs/symptoms of physical distress. Continue to progress workloads to maintain intensity without signs/symptoms of physical distress. Continue to progress workloads to maintain intensity without signs/symptoms of physical distress.   Average METs -- 2.5 2.31 2.27 2.03     Resistance Training   Training Prescription -- Yes Yes Yes Yes   Weight -- 3 lb 3 lb 3 lb 3 lb   Reps -- 10-15 10-15 10-15 10-15     Interval Training   Interval Training -- No No No No     Oxygen   Oxygen -- Continuous Continuous Continuous Continuous   Liters -- 3 3 3 2      Treadmill   MPH -- -- -- 1.2 1.4   Grade -- -- -- 0 0   Minutes -- -- -- 15 15   METs -- -- -- 1.92 2.07     Recumbant Bike   Level -- 1 1 2  --   Watts -- 12 12 12  --   Minutes -- 15 15 15  --   METs -- 2.5 2.77 2.78 --     NuStep   Level -- 1 2 3 3    Minutes -- 15 15 15 15    METs -- 2.5 1.4 2.1 2.1     Biostep-RELP    Level -- -- -- 1 1   Minutes -- -- -- 15 15   METs -- -- -- -- 2     Oxygen   Maintain Oxygen Saturation -- 88% or higher 88% or higher 88% or higher 88% or higher    Row Name 12/04/22 1000 12/12/22 1000 12/19/22 1600         Response to Exercise   Blood Pressure (Admit) 122/68 -- 110/70     Blood Pressure (Exit) -- -- 102/54  Heart Rate (Admit) 84 bpm -- 83 bpm     Heart Rate (Exercise) 83 bpm -- 104 bpm     Heart Rate (Exit) 76 bpm -- 81 bpm     Oxygen Saturation (Admit) 97 % -- 92 %     Oxygen Saturation (Exercise) 89 % -- 91 %     Oxygen Saturation (Exit) 96 % -- 96 %     Rating of Perceived Exertion (Exercise) 13 -- 13     Perceived Dyspnea (Exercise) 1 -- 1     Symptoms none -- none     Duration Progress to 30 minutes of  aerobic without signs/symptoms of physical distress -- Progress to 30 minutes of  aerobic without signs/symptoms of physical distress     Intensity THRR unchanged -- THRR unchanged       Progression   Progression Continue to progress workloads to maintain intensity without signs/symptoms of physical distress. -- Continue to progress workloads to maintain intensity without signs/symptoms of physical distress.     Average METs 1.85 -- 2.25       Resistance Training   Training Prescription Yes -- Yes     Weight 3 lb -- 3 lb     Reps 10-15 -- 10-15       Interval Training   Interval Training No -- No       Oxygen   Oxygen Continuous -- Continuous     Liters 2 -- 2       Treadmill   MPH -- -- 1.8     Grade -- -- 0     Minutes -- -- 15     METs -- -- 2.38       NuStep   Level 2 -- 5     Minutes 15 -- 30     METs 2.1 -- 2.7       Home Exercise Plan   Plans to continue exercise at -- Home (comment)  Walking outside and using 3 lb hand weights Home (comment)  Walking outside and using 3 lb hand weights     Frequency -- Add 3 additional days to program exercise sessions. Add 3 additional days to program exercise sessions.     Initial Home  Exercises Provided -- 12/12/22 12/12/22       Oxygen   Maintain Oxygen Saturation 88% or higher 88% or higher 88% or higher              Exercise Comments:   Exercise Comments     Row Name 09/19/22 1008           Exercise Comments First full day of exercise!  Patient was oriented to gym and equipment including functions, settings, policies, and procedures.  Patient's individual exercise prescription and treatment plan were reviewed.  All starting workloads were established based on the results of the 6 minute walk test done at initial orientation visit.  The plan for exercise progression was also introduced and progression will be customized based on patient's performance and goals.                Exercise Goals and Review:   Exercise Goals     Row Name 09/16/22 1342             Exercise Goals   Increase Physical Activity Yes       Intervention Provide advice, education, support and counseling about physical activity/exercise needs.;Develop an individualized exercise prescription for aerobic and resistive training based on initial  evaluation findings, risk stratification, comorbidities and participant's personal goals.       Expected Outcomes Short Term: Attend rehab on a regular basis to increase amount of physical activity.;Long Term: Add in home exercise to make exercise part of routine and to increase amount of physical activity.;Long Term: Exercising regularly at least 3-5 days a week.       Increase Strength and Stamina Yes       Intervention Provide advice, education, support and counseling about physical activity/exercise needs.;Develop an individualized exercise prescription for aerobic and resistive training based on initial evaluation findings, risk stratification, comorbidities and participant's personal goals.       Expected Outcomes Short Term: Increase workloads from initial exercise prescription for resistance, speed, and METs.;Short Term: Perform  resistance training exercises routinely during rehab and add in resistance training at home;Long Term: Improve cardiorespiratory fitness, muscular endurance and strength as measured by increased METs and functional capacity ( )       Able to understand and use rate of perceived exertion (RPE) scale Yes       Intervention Provide education and explanation on how to use RPE scale       Expected Outcomes Short Term: Able to use RPE daily in rehab to express subjective intensity level;Long Term:  Able to use RPE to guide intensity level when exercising independently       Able to understand and use Dyspnea scale Yes       Intervention Provide education and explanation on how to use Dyspnea scale       Expected Outcomes Short Term: Able to use Dyspnea scale daily in rehab to express subjective sense of shortness of breath during exertion;Long Term: Able to use Dyspnea scale to guide intensity level when exercising independently       Knowledge and understanding of Target Heart Rate Range (THRR) Yes       Intervention Provide education and explanation of THRR including how the numbers were predicted and where they are located for reference       Expected Outcomes Short Term: Able to state/look up THRR;Long Term: Able to use THRR to govern intensity when exercising independently;Short Term: Able to use daily as guideline for intensity in rehab       Able to check pulse independently Yes       Intervention Provide education and demonstration on how to check pulse in carotid and radial arteries.;Review the importance of being able to check your own pulse for safety during independent exercise       Expected Outcomes Short Term: Able to explain why pulse checking is important during independent exercise;Long Term: Able to check pulse independently and accurately       Understanding of Exercise Prescription Yes       Intervention Provide education, explanation, and written materials on patient's individual  exercise prescription       Expected Outcomes Short Term: Able to explain program exercise prescription;Long Term: Able to explain home exercise prescription to exercise independently                Exercise Goals Re-Evaluation :  Exercise Goals Re-Evaluation     Row Name 09/19/22 1008 09/24/22 1437 10/11/22 0934 10/25/22 0943 11/07/22 1545     Exercise Goal Re-Evaluation   Exercise Goals Review Increase Physical Activity;Able to understand and use rate of perceived exertion (RPE) scale;Knowledge and understanding of Target Heart Rate Range (THRR);Understanding of Exercise Prescription;Increase Strength and Stamina;Able to check pulse independently;Able to understand and  use Dyspnea scale Increase Physical Activity;Increase Strength and Stamina;Understanding of Exercise Prescription Increase Physical Activity;Increase Strength and Stamina;Understanding of Exercise Prescription Increase Physical Activity;Increase Strength and Stamina;Understanding of Exercise Prescription Increase Physical Activity;Increase Strength and Stamina;Understanding of Exercise Prescription   Comments Reviewed RPE  and dyspnea scale, THR and program prescription with pt today.  Pt voiced understanding and was given a copy of goals to take home. Hope is off to a good start in the program. She attended her first and only appointment during this review. During the session she used the T4 nustep and recumbent bike at level 1. We will continue to monitor her progress in the program. Hope is doing well in the program. She continues to do well at level 1 on the recumbent bike and improved to level 2 on the T4 nustep. She also continues to do well with 3 lb hand weights for resistance training. We will continue to monitor her progress in the program. Hope continues to do well in the program. She recently improved to level 2 on the recumbent bike and level 3 on the T4 nustep. She also began using the treadmill at a speed of 1.2 mph  with no incline. We will continue to monitor her progress in the program. Hope is doing well in rehab. She increased her treadmill workload to a speed of 1.4 mph with no incline. She also has stayed consistent on the biostep at level 1 and T4 nustep at level 3. We will continue to monitor her progress in the program.   Expected Outcomes Short: Use RPE daily to regulate intensity.  Long: Follow program prescription in THR. Short: Continue to follow current exercise prescription. Long: Continue exercise to improve strength and stamina. Short: Try level 2 on the recumbent bike. Long: Continue exercise to improve strength and stamina. Short: Continue to walk on treadmill. Long: Continue exercise to improve strength and stamina. Short: Increase to level 2 on the biostep. Long: Continue exercise to improve strength and stamina.    Row Name 12/04/22 1052 12/12/22 1013 12/19/22 1607         Exercise Goal Re-Evaluation   Exercise Goals Review Increase Physical Activity;Increase Strength and Stamina;Understanding of Exercise Prescription -- Increase Physical Activity;Increase Strength and Stamina;Understanding of Exercise Prescription     Comments Hope continues to do well in rehab. She was only able to attend one session during this review. During the one session she only did the T4 nustep at level 2. We will continue to monitor her progress in the program. Reviewed home exercise with pt today from 09:39 to 09:50.  Pt plans to walk outside at home under the right weather conditions and use 3 lb hand weights for resistance training as well.  Reviewed THR, pulse, RPE, sign and symptoms, pulse oximetery and when to call 911 or MD.  Also discussed weather considerations and indoor options.  Pt voiced understanding. Hope continues to do well in the program. She continues to walk on the treadmill at a speed of 1.8 mph with no incline. She also improved to level 5 on the T4 nustep and was able to work for 30 minutes on  this machine. We will continue to monitor her progress in the program.     Expected Outcomes Short; attend rehab more regularyly. Long: Continue exercise to improve strength and stamina. Short: Begin walking at home on days away from rehab. Long: Continue exercise to improve strength and stamina. Short: Begin to progressively increase treadmill workload. Long: Continue exercise  to improve strength and stamina.              Discharge Exercise Prescription (Final Exercise Prescription Changes):  Exercise Prescription Changes - 12/19/22 1600       Response to Exercise   Blood Pressure (Admit) 110/70    Blood Pressure (Exit) 102/54    Heart Rate (Admit) 83 bpm    Heart Rate (Exercise) 104 bpm    Heart Rate (Exit) 81 bpm    Oxygen Saturation (Admit) 92 %    Oxygen Saturation (Exercise) 91 %    Oxygen Saturation (Exit) 96 %    Rating of Perceived Exertion (Exercise) 13    Perceived Dyspnea (Exercise) 1    Symptoms none    Duration Progress to 30 minutes of  aerobic without signs/symptoms of physical distress    Intensity THRR unchanged      Progression   Progression Continue to progress workloads to maintain intensity without signs/symptoms of physical distress.    Average METs 2.25      Resistance Training   Training Prescription Yes    Weight 3 lb    Reps 10-15      Interval Training   Interval Training No      Oxygen   Oxygen Continuous    Liters 2      Treadmill   MPH 1.8    Grade 0    Minutes 15    METs 2.38      NuStep   Level 5    Minutes 30    METs 2.7      Home Exercise Plan   Plans to continue exercise at Home (comment)   Walking outside and using 3 lb hand weights   Frequency Add 3 additional days to program exercise sessions.    Initial Home Exercises Provided 12/12/22      Oxygen   Maintain Oxygen Saturation 88% or higher             Nutrition:  Target Goals: Understanding of nutrition guidelines, daily intake of sodium 1500mg , cholesterol  200mg , calories 30% from fat and 7% or less from saturated fats, daily to have 5 or more servings of fruits and vegetables.  Education: All About Nutrition: -Group instruction provided by verbal, written material, interactive activities, discussions, models, and posters to present general guidelines for heart healthy nutrition including fat, fiber, MyPlate, the role of sodium in heart healthy nutrition, utilization of the nutrition label, and utilization of this knowledge for meal planning. Follow up email sent as well. Written material given at graduation.   Biometrics:  Pre Biometrics - 09/16/22 1343       Pre Biometrics   Height 5\' 8"  (1.727 m)    Weight 106 lb 1.6 oz (48.1 kg)    Waist Circumference 26.5 inches    Hip Circumference 33 inches    Waist to Hip Ratio 0.8 %    BMI (Calculated) 16.14    Single Leg Stand 4.8 seconds              Nutrition Therapy Plan and Nutrition Goals:  Nutrition Therapy & Goals - 09/16/22 1332       Nutrition Therapy   RD appointment deferred Yes      Intervention Plan   Intervention Prescribe, educate and counsel regarding individualized specific dietary modifications aiming towards targeted core components such as weight, hypertension, lipid management, diabetes, heart failure and other comorbidities.    Expected Outcomes Short Term Goal: Understand basic principles of dietary  content, such as calories, fat, sodium, cholesterol and nutrients.;Short Term Goal: A plan has been developed with personal nutrition goals set during dietitian appointment.;Long Term Goal: Adherence to prescribed nutrition plan.             Nutrition Assessments:  MEDIFICTS Score Key: >=70 Need to make dietary changes  40-70 Heart Healthy Diet <= 40 Therapeutic Level Cholesterol Diet  Flowsheet Row Pulmonary Rehab from 09/16/2022 in [facility] Card[facility] Pulmonary Rehab  Picture Your Plate Total Score on Admission 52      Picture Your Plate Scores: <11  Unhealthy dietary pattern with much room for improvement. 41-50 Dietary pattern unlikely to meet recommendations for good health and room for improvement. 51-60 More healthful dietary pattern, with some room for improvement.  >60 Healthy dietary pattern, although there may be some specific behaviors that could be improved.   Nutrition Goals Re-Evaluation:  Nutrition Goals Re-Evaluation     Row Name 10/24/22 0941             Goals   Current Weight 111 lb (50.3 kg)       Comment Patient was informed on why it is important to maintain a balanced diet when dealing with Respiratory issues. Explained that it takes a lot of energy to breath and when they are short of breath often they will need to have a good diet to help keep up with the calories they are expending for breathing.       Expected Outcome Short: Choose and plan snacks accordingly to patients caloric intake to improve breathing. Long: Maintain a diet independently that meets their caloric intake to aid in daily shortness of breath.                Nutrition Goals Discharge (Final Nutrition Goals Re-Evaluation):  Nutrition Goals Re-Evaluation - 10/24/22 0941       Goals   Current Weight 111 lb (50.3 kg)    Comment Patient was informed on why it is important to maintain a balanced diet when dealing with Respiratory issues. Explained that it takes a lot of energy to breath and when they are short of breath often they will need to have a good diet to help keep up with the calories they are expending for breathing.    Expected Outcome Short: Choose and plan snacks accordingly to patients caloric intake to improve breathing. Long: Maintain a diet independently that meets their caloric intake to aid in daily shortness of breath.             Psychosocial: Target Goals: Acknowledge presence or absence of significant depression and/or stress, maximize coping skills, provide positive support system. Participant is able to  verbalize types and ability to use techniques and skills needed for reducing stress and depression.   Education: Stress, Anxiety, and Depression - Group verbal and visual presentation to define topics covered.  Reviews how body is impacted by stress, anxiety, and depression.  Also discusses healthy ways to reduce stress and to treat/manage anxiety and depression.  Written material given at graduation.   Education: Sleep Hygiene -Provides group verbal and written instruction about how sleep can affect your health.  Define sleep hygiene, discuss sleep cycles and impact of sleep habits. Review good sleep hygiene tips.    Initial Review & Psychosocial Screening:  Initial Psych Review & Screening - 08/26/22 1631       Initial Review   Current issues with Current Psychotropic Meds      Family Dynamics  Good Support System? Yes   Lives alone but her son lives close by and is great support!     Screening Interventions   Interventions Encouraged to exercise    Expected Outcomes Short Term goal: Utilizing psychosocial counselor, staff and physician to assist with identification of specific Stressors or current issues interfering with healing process. Setting desired goal for each stressor or current issue identified.;Long Term Goal: Stressors or current issues are controlled or eliminated.;Short Term goal: Identification and review with participant of any Quality of Life or Depression concerns found by scoring the questionnaire.;Long Term goal: The participant improves quality of Life and PHQ9 Scores as seen by post scores and/or verbalization of changes             Quality of Life Scores:  Scores of 19 and below usually indicate a poorer quality of life in these areas.  A difference of  2-3 points is a clinically meaningful difference.  A difference of 2-3 points in the total score of the Quality of Life Index has been associated with significant improvement in overall quality of life,  self-image, physical symptoms, and general health in studies assessing change in quality of life.  PHQ-9: Review Flowsheet       10/24/2022 09/16/2022 03/18/2022  Depression screen PHQ 2/9  Decreased Interest 2 2 1   Down, Depressed, Hopeless 1 2 1   PHQ - 2 Score 3 4 2   Altered sleeping 1 1 -  Tired, decreased energy 2 2 -  Change in appetite 0 1 -  Feeling bad or failure about yourself  2 2 -  Trouble concentrating 1 1 -  Moving slowly or fidgety/restless 1 1 -  Suicidal thoughts 0 0 -  PHQ-9 Score 10 12 -  Difficult doing work/chores Somewhat difficult Somewhat difficult -   Interpretation of Total Score  Total Score Depression Severity:  1-4 = Minimal depression, 5-9 = Mild depression, 10-14 = Moderate depression, 15-19 = Moderately severe depression, 20-27 = Severe depression   Psychosocial Evaluation and Intervention:  Psychosocial Evaluation - 08/26/22 1635       Psychosocial Evaluation & Interventions   Interventions Encouraged to exercise with the program and follow exercise prescription    Comments Laren "Hope" is coming to pulmonary rehab post emphysema.  She has decreased her tobacco use to now 2 cigarettes per day. She is using nicotene patch and already has cessation material and declines further information at this time. Hope is looking to improve her breathing as she has been having progressive dyspnea, reduce shortness of breath with ADL's and improve muscle strength from rehab. She lives alone and has a son that lives close by that is great support. Hope currently uses home oxygen @ 2.5L/min pulsed. She does have some balance concerns and uses a cane with ambulation. Advised she bring cane with her to rehab. She has had one fall in the past year, with only injury to her toe. 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 She has no barriers to attending the program and is looking forward to starting rehab.    Expected  Outcomes Short: Attend pulmonary rehab for education and exercise.  Long: Develop and maintain positive self care habits.    Continue Psychosocial Services  Follow up required by staff             Psychosocial Re-Evaluation:  Psychosocial Re-Evaluation     Row Name 10/24/22 [phone]  Psychosocial Re-Evaluation   Current issues with Current Stress Concerns;History of Depression;Current Psychotropic Meds       Comments Reviewed patient health questionnaire (PHQ-9) with patient for follow up. Previously, patients score indicated signs/symptoms of depression.  Reviewed to see if patient is improving symptom wise while in program.  Score improved and patient states that it is because she is able to come to exercise class.       Expected Outcomes Short: Continue to attend [facility] regularly for regular exercise and social engagement. Long: Continue to improve symptoms and manage a positive mental state.       Interventions Encouraged to attend Pulmonary Rehabilitation for the exercise       Continue Psychosocial Services  Follow up required by staff                Psychosocial Discharge (Final Psychosocial Re-Evaluation):  Psychosocial Re-Evaluation - 10/24/22 0945       Psychosocial Re-Evaluation   Current issues with Current Stress Concerns;History of Depression;Current Psychotropic Meds    Comments Reviewed patient health questionnaire (PHQ-9) with patient for follow up. Previously, patients score indicated signs/symptoms of depression.  Reviewed to see if patient is improving symptom wise while in program.  Score improved and patient states that it is because she is able to come to exercise class.    Expected Outcomes Short: Continue to attend [facility] regularly for regular exercise and social engagement. Long: Continue to improve symptoms and manage a positive mental state.    Interventions Encouraged to attend Pulmonary Rehabilitation for the exercise    Continue  Psychosocial Services  Follow up required by staff             Education: Education Goals: Education classes will be provided on a weekly basis, covering required topics. Participant will state understanding/return demonstration of topics presented.  Learning Barriers/Preferences:   General Pulmonary Education Topics:  Infection Prevention: - Provides verbal and written material to individual with discussion of infection control including proper hand washing and proper equipment cleaning during exercise session. Flowsheet Row Pulmonary Rehab from 12/03/2022 in [facility] Cardiac and Pulmonary Rehab  Date 09/16/22  Educator NT  Instruction Review Code 1- Verbalizes Understanding       Falls Prevention: - Provides verbal and written material to individual with discussion of falls prevention and safety. Flowsheet Row Pulmonary Rehab from 12/03/2022 in [facility] Cardiac and Pulmonary Rehab  Date 09/16/22  Educator NT  Instruction Review Code 1- Verbalizes Understanding       Chronic Lung Disease Review: - Group verbal instruction with posters, models, PowerPoint presentations and videos,  to review new updates, new respiratory medications, new advancements in procedures and treatments. Providing information on websites and "800" numbers for continued self-education. Includes information about supplement oxygen, available portable oxygen systems, continuous and intermittent flow rates, oxygen safety, concentrators, and Medicare reimbursement for oxygen. Explanation of Pulmonary Drugs, including class, frequency, complications, importance of spacers, rinsing mouth after steroid MDI's, and proper cleaning methods for nebulizers. Review of basic lung anatomy and physiology related to function, structure, and complications of lung disease. Review of risk factors. Discussion about methods for diagnosing sleep apnea and types of masks and machines for OSA. Includes a review of the use of types of  environmental controls: home humidity, furnaces, filters, dust mite/pet prevention, HEPA vacuums. Discussion about weather changes, air quality and the benefits of nasal washing. Instruction on Warning signs, infection symptoms, calling MD promptly, preventive modes, and value of vaccinations. Review of effective  airway clearance, coughing and/or vibration techniques. Emphasizing that all should Create an Action Plan. Written material given at graduation. Flowsheet Row Pulmonary Rehab from 12/03/2022 in [facility] Cardiac and Pulmonary Rehab  Education need identified 09/16/22  Date 10/03/22  Educator [facility]  Instruction Review Code 1- Verbalizes Understanding       AED/CPR: - Group verbal and written instruction with the use of models to demonstrate the basic use of the AED with the basic ABC's of resuscitation.    Anatomy and Cardiac Procedures: - Group verbal and visual presentation and models provide information about basic cardiac anatomy and function. Reviews the testing methods done to diagnose heart disease and the outcomes of the test results. Describes the treatment choices: Medical Management, Angioplasty, or Coronary Bypass Surgery for treating various heart conditions including Myocardial Infarction, Angina, Valve Disease, and Cardiac Arrhythmias.  Written material given at graduation. Flowsheet Row Pulmonary Rehab from 12/03/2022 in [facility] Cardiac and Pulmonary Rehab  Education need identified 09/16/22  Date 09/19/22  Educator SB  Instruction Review Code 1- Verbalizes Understanding       Medication Safety: - Group verbal and visual instruction to review commonly prescribed medications for heart and lung disease. Reviews the medication, class of the drug, and side effects. Includes the steps to properly store meds and maintain the prescription regimen.  Written material given at graduation. Flowsheet Row Pulmonary Rehab from 12/03/2022 in [facility] Cardiac and Pulmonary Rehab  Education need  identified 09/16/22  Date 12/03/22  Educator [facility]  Instruction Review Code 1- Verbalizes Understanding       Other: -Provides group and verbal instruction on various topics (see comments)   Knowledge Questionnaire Score:  Knowledge Questionnaire Score - 09/16/22 1331       Knowledge Questionnaire Score   Pre Score 12/18              Core Components/Risk Factors/Patient Goals at Admission:  Personal Goals and Risk Factors at Admission - 08/26/22 1633       Core Components/Risk Factors/Patient Goals on Admission    Weight Management Yes    Intervention Weight Management: Develop a combined nutrition and exercise program designed to reach desired caloric intake, while maintaining appropriate intake of nutrient and fiber, sodium and fats, and appropriate energy expenditure required for the weight goal.;Weight Management: Provide education and appropriate resources to help participant work on and attain dietary goals.    Admit Weight 109 lb (49.4 kg)    Expected Outcomes Short Term: Continue to assess and modify interventions until short term weight is achieved;Long Term: Adherence to nutrition and physical activity/exercise program aimed toward attainment of established weight goal;Weight Maintenance: Understanding of the daily nutrition guidelines, which includes 25-35% calories from fat, 7% or less cal from saturated fats, less than 200mg  cholesterol, less than 1.5gm of sodium, & 5 or more servings of fruits and vegetables daily;Understanding recommendations for meals to include 15-35% energy as protein, 25-35% energy from fat, 35-60% energy from carbohydrates, less than 200mg  of dietary cholesterol, 20-35 gm of total fiber daily;Understanding of distribution of calorie intake throughout the day with the consumption of 4-5 meals/snacks    Tobacco Cessation Yes    Number of packs per day 2 cigarettes per day    Intervention Assist the participant in steps to quit. Provide  individualized education and counseling about committing to Tobacco Cessation, relapse prevention, and pharmacological support that can be provided by physician.    Improve shortness of breath with ADL's Yes    Intervention Provide  education, individualized exercise plan and daily activity instruction to help decrease symptoms of SOB with activities of daily living.    Expected Outcomes Short Term: Improve cardiorespiratory fitness to achieve a reduction of symptoms when performing ADLs             Education:Diabetes - Individual verbal and written instruction to review signs/symptoms of diabetes, desired ranges of glucose level fasting, after meals and with exercise. Acknowledge that pre and post exercise glucose checks will be done for 3 sessions at entry of program.   Know Your Numbers and Heart Failure: - Group verbal and visual instruction to discuss disease risk factors for cardiac and pulmonary disease and treatment options.  Reviews associated critical values for Overweight/Obesity, Hypertension, Cholesterol, and Diabetes.  Discusses basics of heart failure: signs/symptoms and treatments.  Introduces Heart Failure Zone chart for action plan for heart failure.  Written material given at graduation. Flowsheet Row Pulmonary Rehab from 12/03/2022 in [facility] Cardiac and Pulmonary Rehab  Date 10/10/22  Educator SB  Instruction Review Code 1- Verbalizes Understanding       Core Components/Risk Factors/Patient Goals Review:   Goals and Risk Factor Review     Row Name 10/24/22 [phone]             Core Components/Risk Factors/Patient Goals Review   Personal Goals Review Improve shortness of breath with ADL's       Review Spoke to patient about their shortness of breath and what they can do to improve. Patient has been informed of breathing techniques when starting the program. Patient is informed to tell staff if they have had any med changes and that certain meds they are taking or not  taking can be causing shortness of breath.       Expected Outcomes Short: Attend [facility] regularly to improve shortness of breath with ADL's. Long: maintain independence with ADL's                Core Components/Risk Factors/Patient Goals at Discharge (Final Review):   Goals and Risk Factor Review - 10/24/22 0942       Core Components/Risk Factors/Patient Goals Review   Personal Goals Review Improve shortness of breath with ADL's    Review Spoke to patient about their shortness of breath and what they can do to improve. Patient has been informed of breathing techniques when starting the program. Patient is informed to tell staff if they have had any med changes and that certain meds they are taking or not taking can be causing shortness of breath.    Expected Outcomes Short: Attend [facility] regularly to improve shortness of breath with ADL's. Long: maintain independence with ADL's             ITP Comments:  ITP Comments     Row Name 08/26/22 1628 09/16/22 1329 09/19/22 1008 10/09/22 1239 11/06/22 0854   ITP Comments Initial phone call completed. Dx can be found in [facility] 7/16. EP orientation scheduled for 8/22 at 2:30 pm. Completed and gym orientation. Initial ITP created and sent for review to Dr. Vida Rigger, Medical Director. First full day of exercise!  Patient was oriented to gym and equipment including functions, settings, policies, and procedures.  Patient's individual exercise prescription and treatment plan were reviewed.  All starting workloads were established based on the results of the 6 minute walk test done at initial orientation visit.  The plan for exercise progression was also introduced and progression will be customized based on patient's performance and  goals. 30 Day review completed. Medical Director ITP review done, changes made as directed, and signed approval by Medical Director.    new to program 30 Day review completed. Medical Director ITP review done,  changes made as directed, and signed approval by Medical Director.    Row Name 11/27/22 0932 12/25/22 1147         ITP Comments 30 Day review completed. Medical Director ITP review done, changes made as directed, and signed approval by Medical Director. 30 Day review completed. Medical Director ITP review done, changes made as directed, and signed approval by Medical Director.               Comments:

## 2022-12-26 ENCOUNTER — Ambulatory Visit: Payer: 59 | Attending: Student in an Organized Health Care Education/Training Program

## 2022-12-26 DIAGNOSIS — J432 Centrilobular emphysema: Secondary | ICD-10-CM | POA: Diagnosis not present

## 2022-12-26 DIAGNOSIS — R0602 Shortness of breath: Secondary | ICD-10-CM | POA: Diagnosis present

## 2022-12-26 DIAGNOSIS — Z87891 Personal history of nicotine dependence: Secondary | ICD-10-CM

## 2022-12-26 LAB — PULMONARY FUNCTION TEST ARMC ONLY
DL/VA % pred: 58 %
DL/VA: 2.38 ml/min/mmHg/L
DLCO unc % pred: 44 %
DLCO unc: 10.54 ml/min/mmHg
FEF 25-75 Post: 2.02 L/s
FEF 25-75 Pre: 0.84 L/s
FEF2575-%Change-Post: 142 %
FEF2575-%Pred-Post: 77 %
FEF2575-%Pred-Pre: 31 %
FEV1-%Change-Post: 32 %
FEV1-%Pred-Post: 68 %
FEV1-%Pred-Pre: 51 %
FEV1-Post: 2.06 L
FEV1-Pre: 1.56 L
FEV1FVC-%Change-Post: 7 %
FEV1FVC-%Pred-Pre: 80 %
FEV6-%Change-Post: 23 %
FEV6-%Pred-Post: 80 %
FEV6-%Pred-Pre: 65 %
FEV6-Post: 3.01 L
FEV6-Pre: 2.43 L
FEV6FVC-%Change-Post: 0 %
FEV6FVC-%Pred-Post: 101 %
FEV6FVC-%Pred-Pre: 101 %
FVC-%Change-Post: 23 %
FVC-%Pred-Post: 79 %
FVC-%Pred-Pre: 63 %
FVC-Post: 3.06 L
FVC-Pre: 2.48 L
Post FEV1/FVC ratio: 67 %
Post FEV6/FVC ratio: 98 %
Pre FEV1/FVC ratio: 63 %
Pre FEV6/FVC Ratio: 98 %
RV % pred: 140 %
RV: 3.1 L
TLC % pred: 96 %
TLC: 5.54 L

## 2022-12-26 MED ORDER — ALBUTEROL SULFATE (2.5 MG/3ML) 0.083% IN NEBU
2.5000 mg | INHALATION_SOLUTION | Freq: Once | RESPIRATORY_TRACT | Status: AC
  Filled 2022-12-26: qty 3

## 2022-12-26 MED ORDER — ALBUTEROL SULFATE (2.5 MG/3ML) 0.083% IN NEBU
2.5000 mg | INHALATION_SOLUTION | Freq: Once | RESPIRATORY_TRACT | Status: AC
  Administered 2022-12-26: 2.5 mg via RESPIRATORY_TRACT
  Filled 2022-12-26: qty 3

## 2022-12-27 ENCOUNTER — Ambulatory Visit: Payer: 59 | Admitting: Student in an Organized Health Care Education/Training Program

## 2022-12-27 ENCOUNTER — Encounter: Payer: Self-pay | Admitting: Student in an Organized Health Care Education/Training Program

## 2022-12-27 VITALS — BP 100/66 | HR 87 | Temp 97.6°F | Ht 68.0 in | Wt 113.8 lb

## 2022-12-27 DIAGNOSIS — Z87891 Personal history of nicotine dependence: Secondary | ICD-10-CM

## 2022-12-27 DIAGNOSIS — Z23 Encounter for immunization: Secondary | ICD-10-CM | POA: Diagnosis not present

## 2022-12-27 DIAGNOSIS — J4540 Moderate persistent asthma, uncomplicated: Secondary | ICD-10-CM | POA: Diagnosis not present

## 2022-12-27 DIAGNOSIS — J432 Centrilobular emphysema: Secondary | ICD-10-CM | POA: Diagnosis not present

## 2022-12-27 MED ORDER — NICOTINE POLACRILEX 2 MG MT LOZG
2.0000 mg | LOZENGE | OROMUCOSAL | 3 refills | Status: AC | PRN
Start: 2022-12-27 — End: 2023-03-27

## 2022-12-27 MED ORDER — FLUTICASONE-SALMETEROL 230-21 MCG/ACT IN AERO
2.0000 | INHALATION_SPRAY | Freq: Two times a day (BID) | RESPIRATORY_TRACT | 12 refills | Status: AC
Start: 2022-12-27 — End: ?

## 2022-12-27 NOTE — Progress Notes (Unsigned)
Synopsis: Referred in *** by Marliss Coots, MD  Assessment & Plan:   1. Centrilobular emphysema (HCC) *** - fluticasone-salmeterol (ADVAIR HFA) 230-21 MCG/ACT inhaler; Inhale 2 puffs into the lungs 2 (two) times daily.  Dispense: 1 each; Refill: 12 - nicotine polacrilex (NICOTINE MINI) 2 MG lozenge; Take 1 lozenge (2 mg total) by mouth every 2 (two) hours as needed for smoking cessation.  Dispense: 72 lozenge; Refill: 3  2. Moderate persistent asthma without complication (Primary) *** - fluticasone-salmeterol (ADVAIR HFA) 230-21 MCG/ACT inhaler; Inhale 2 puffs into the lungs 2 (two) times daily.  Dispense: 1 each; Refill: 12 - nicotine polacrilex (NICOTINE MINI) 2 MG lozenge; Take 1 lozenge (2 mg total) by mouth every 2 (two) hours as needed for smoking cessation.  Dispense: 72 lozenge; Refill: 3  3. Need for prophylactic vaccination against Streptococcus pneumoniae (pneumococcus) *** - Pneumococcal conjugate vaccine 20-valent (Prevnar-20)  COPD, PFT's with significant reversibility. Told me she wheezed growing up, cocnern for asthma. Will increase dose of ICS from symbicort to adviar. Prevnar today. 1 cig a day, sent nicotine lozenges.  Return in about 6 months (around 06/27/2023).  I spent *** minutes caring for this patient today, including {EM billing:28027}  Raechel Chute, MD [facility] 12/27/2022 10:27 AM    End of visit medications:  Meds ordered this encounter  Medications   fluticasone-salmeterol (ADVAIR HFA) 230-21 MCG/ACT inhaler    Sig: Inhale 2 puffs into the lungs 2 (two) times daily.    Dispense:  1 each    Refill:  12   nicotine polacrilex (NICOTINE MINI) 2 MG lozenge    Sig: Take 1 lozenge (2 mg total) by mouth every 2 (two) hours as needed for smoking cessation.    Dispense:  72 lozenge    Refill:  3     Current Outpatient Medications:    albuterol (PROVENTIL) (2.5 MG/3ML) 0.083% nebulizer solution, Take 2.5 mg by nebulization 4  (four) times daily., Disp: , Rfl:    aspirin 81 MG chewable tablet, Chew 81 mg by mouth daily., Disp: , Rfl:    buPROPion (WELLBUTRIN SR) 150 MG 12 hr tablet, Take 150 mg by mouth 2 (two) times daily., Disp: , Rfl:    clonazePAM (KLONOPIN) 1 MG tablet, Take 1 mg by mouth at bedtime., Disp: , Rfl:    cyanocobalamin (VITAMIN B12) 500 MCG tablet, Take 500 mcg by mouth daily., Disp: , Rfl:    ferrous sulfate 324 MG TBEC, Take 324 mg by mouth., Disp: , Rfl:    fluticasone-salmeterol (ADVAIR HFA) 230-21 MCG/ACT inhaler, Inhale 2 puffs into the lungs 2 (two) times daily., Disp: 1 each, Rfl: 12   levothyroxine (SYNTHROID) 50 MCG tablet, Take 50 mcg by mouth daily., Disp: , Rfl:    meloxicam (MOBIC) 15 MG tablet, Take 15 mg by mouth at bedtime., Disp: , Rfl:    naloxone (NARCAN) nasal spray 4 mg/0.1 mL, SMARTSIG:Both Nares, Disp: , Rfl:    nicotine (NICODERM CQ - DOSED IN MG/24 HOURS) 14 mg/24hr patch, Place 14 mg onto the skin daily., Disp: , Rfl:    nicotine polacrilex (NICOTINE MINI) 2 MG lozenge, Take 1 lozenge (2 mg total) by mouth every 2 (two) hours as needed for smoking cessation., Disp: 72 lozenge, Rfl: 3   nitroGLYCERIN (NITROSTAT) 0.3 MG SL tablet, Place 0.3 mg under the tongue every 5 (five) minutes as needed for chest pain., Disp: , Rfl:    omeprazole (PRILOSEC) 40 MG capsule, Take 40 mg  by mouth 2 (two) times daily., Disp: , Rfl:    PARoxetine (PAXIL) 20 MG tablet, Take 20 mg by mouth at bedtime., Disp: , Rfl:    pravastatin (PRAVACHOL) 10 MG tablet, Take 10 mg by mouth daily., Disp: , Rfl:    QUEtiapine (SEROQUEL) 400 MG tablet, Take 400 mg by mouth at bedtime., Disp: , Rfl:    sucralfate (CARAFATE) 1 GM/10ML suspension, Take 2 g by mouth 3 times/day as needed-between meals & bedtime., Disp: , Rfl:    SUMAtriptan (IMITREX) 100 MG tablet, Take 100 mg by mouth every 2 (two) hours as needed for migraine. May repeat in 2 hours if headache persists or recurs., Disp: , Rfl:    Tiotropium Bromide  Monohydrate (SPIRIVA RESPIMAT) 2.5 MCG/ACT AERS, Inhale 2 puffs into the lungs daily., Disp: 4 g, Rfl: 11   traMADol (ULTRAM) 50 MG tablet, Take 50 mg by mouth 4 (four) times daily., Disp: , Rfl:  No current facility-administered medications for this visit.  Facility-Administered Medications Ordered in Other Visits:    albuterol (PROVENTIL) (2.5 MG/3ML) 0.083% nebulizer solution 2.5 mg, 2.5 mg, Nebulization, Once, Raechel Chute, MD   Subjective:   PATIENT ID: Tammy Maxwell GENDER: female DOB: 1960/11/25, MRN: [id number]  Chief Complaint  Patient presents with   Follow-up    Cough, shortness of breath and wheezing.     HPI ***  Ancillary information including prior medications, full medical/surgical/family/social histories, and PFTs (when available) are listed below and have been reviewed.   ROS   Objective:   Vitals:   12/27/22 0957  BP: 100/66  Pulse: 87  Temp: 97.6 F (36.4 C)  TempSrc: Temporal  SpO2: 97%  Weight: 113 lb 12.8 oz (51.6 kg)  Height: 5\' 8"  (1.727 m)   97% on *** LPM *** RA BMI Readings from Last 3 Encounters:  12/27/22 17.30 kg/m  09/23/22 16.57 kg/m  09/16/22 16.13 kg/m   Wt Readings from Last 3 Encounters:  12/27/22 113 lb 12.8 oz (51.6 kg)  09/23/22 109 lb (49.4 kg)  09/16/22 106 lb 1.6 oz (48.1 kg)    Physical Exam    Ancillary Information    Past Medical History:  Diagnosis Date   Anemia    Arthritis    Bronchitis    Chest pain    COPD (chronic obstructive pulmonary disease) (HCC)    Depression    Dysrhythmia    GERD (gastroesophageal reflux disease)    Headache    Heart murmur    History of hiatal hernia    Pneumonia    Pneumothorax    Wears dentures    full upper     Family History  Problem Relation Age of Onset   Stroke Mother    Hypertension Mother    Diabetes Mother    Clotting disorder Mother    Cancer Mother    Anesthesia problems Mother    Stroke Father    Hypertension Father    Diabetes Father     Cancer Father    Clotting disorder Father    Anesthesia problems Father    Hypertension Sister    Diabetes Sister    Hypertension Brother    Parkinson's disease Maternal Grandfather      Past Surgical History:  Procedure Laterality Date   APPENDECTOMY     CATARACT EXTRACTION W/PHACO Right 08/19/2019   Procedure: CATARACT EXTRACTION PHACO AND INTRAOCULAR LENS PLACEMENT (IOC) RIGHT;  Surgeon: Nevada Crane, MD;  Location: [facility];  Service: Ophthalmology;  Laterality: Right;  [place] 00:14.5 CDE 0.90 Fluid Pack lot # [id number]   CATARACT EXTRACTION W/PHACO Left 09/20/2019   Procedure: CATARACT EXTRACTION PHACO AND INTRAOCULAR LENS PLACEMENT (IOC) LEFT 1.27  00:13.7;  Surgeon: Nevada Crane, MD;  Location: [facility];  Service: Ophthalmology;  Laterality: Left;   COLONOSCOPY     COLONOSCOPY WITH PROPOFOL N/A 02/15/2020   Procedure: COLONOSCOPY WITH PROPOFOL;  Surgeon: Wyline Mood, MD;  Location: [facility];  Service: Gastroenterology;  Laterality: N/A;   ESOPHAGOGASTRODUODENOSCOPY (EGD) WITH PROPOFOL N/A 02/15/2020   Procedure: ESOPHAGOGASTRODUODENOSCOPY (EGD) WITH PROPOFOL;  Surgeon: Wyline Mood, MD;  Location: [facility] END[facility];  Service: Gastroenterology;  Laterality: N/A;   LUNG LOBECTOMY Left    OOPHORECTOMY     ROTATOR CUFF REPAIR     SHOULDER ARTHROSCOPY Right     Social History   Socioeconomic History   Marital status: Single    Spouse name: Not on file   Number of children: Not on file   Years of education: Not on file   Highest education level: Not on file  Occupational History   Not on file  Tobacco Use   Smoking status: Every Day    Current packs/day: 2.00    Average packs/day: 2.0 packs/day for 49.0 years (97.9 ttl pk-yrs)    Types: Cigarettes    Start date: 1976   Smokeless tobacco: Never   Tobacco comments:    2 cigs daily- 07/23/2022  Vaping Use   Vaping status: Never Used  Substance and Sexual Activity   Alcohol use: Not Currently   Drug use:  Not Currently   Sexual activity: Not Currently  Other Topics Concern   Not on file  Social History Narrative   Not on file   Social Drivers of Health   Financial Resource Strain: Not on file  Food Insecurity: Food Insecurity Present (03/18/2022)   Hunger Vital Sign    Worried About Running Out of Food in the Last Year: Sometimes true    Ran Out of Food in the Last Year: Sometimes true  Transportation Needs: No Transportation Needs (03/18/2022)   PRAPARE - Administrator, Civil Service (Medical): No    Lack of Transportation (Non-Medical): No  Physical Activity: Not on file  Stress: Not on file  Social Connections: Unknown (03/18/2022)   Social Connection and Isolation Panel [NHANES]    Frequency of Communication with Friends and Family: Patient declined    Frequency of Social Gatherings with Friends and Family: Patient declined    Attends Religious Services: Patient declined    Active Member of Clubs or Organizations: Not on file    Attends Banker Meetings: Patient declined    Marital Status: Patient declined  Intimate Partner Violence: Not At Risk (03/18/2022)   Humiliation, Afraid, Rape, and Kick questionnaire    Fear of Current or Ex-Partner: No    Emotionally Abused: No    Physically Abused: No    Sexually Abused: No     Allergies  Allergen Reactions   Codeine Anaphylaxis   Penicillins Anaphylaxis     CBC    Component Value Date/Time   WBC 5.9 07/16/2022 1248   RBC 4.98 07/16/2022 1248   HGB 13.7 07/16/2022 1248   HCT 42.5 07/16/2022 1248   PLT 144 (L) 07/16/2022 1248   MCV 85.3 07/16/2022 1248   MCH 27.5 07/16/2022 1248   MCHC 32.2 07/16/2022 1248   RDW 19.3 (H) 07/16/2022 1248   LYMPHSABS 2.2 07/16/2022 1248   MONOABS 0.4 07/16/2022  1248   EOSABS 0.1 07/16/2022 1248   BASOSABS 0.1 07/16/2022 1248    Pulmonary Functions Testing Results:    Latest Ref Rng & Units 12/26/2022   11:44 AM  PFT Results  FVC-Pre L 2.48    FVC-Predicted Pre % 63   FVC-Post L 3.06   FVC-Predicted Post % 79   Pre FEV1/FVC % % 63   Post FEV1/FCV % % 67   FEV1-Pre L 1.56   FEV1-Predicted Pre % 51   FEV1-Post L 2.06   DLCO uncorrected ml/min/mmHg 10.54   DLCO UNC% % 44   DLVA Predicted % 58   TLC L 5.54   TLC % Predicted % 96   RV % Predicted % 140     Outpatient Medications Prior to Visit  Medication Sig Dispense Refill   albuterol (PROVENTIL) (2.5 MG/3ML) 0.083% nebulizer solution Take 2.5 mg by nebulization 4 (four) times daily.     aspirin 81 MG chewable tablet Chew 81 mg by mouth daily.     buPROPion (WELLBUTRIN SR) 150 MG 12 hr tablet Take 150 mg by mouth 2 (two) times daily.     clonazePAM (KLONOPIN) 1 MG tablet Take 1 mg by mouth at bedtime.     cyanocobalamin (VITAMIN B12) 500 MCG tablet Take 500 mcg by mouth daily.     ferrous sulfate 324 MG TBEC Take 324 mg by mouth.     levothyroxine (SYNTHROID) 50 MCG tablet Take 50 mcg by mouth daily.     meloxicam (MOBIC) 15 MG tablet Take 15 mg by mouth at bedtime.     naloxone (NARCAN) nasal spray 4 mg/0.1 mL SMARTSIG:Both Nares     nicotine (NICODERM CQ - DOSED IN MG/24 HOURS) 14 mg/24hr patch Place 14 mg onto the skin daily.     nitroGLYCERIN (NITROSTAT) 0.3 MG SL tablet Place 0.3 mg under the tongue every 5 (five) minutes as needed for chest pain.     omeprazole (PRILOSEC) 40 MG capsule Take 40 mg by mouth 2 (two) times daily.     PARoxetine (PAXIL) 20 MG tablet Take 20 mg by mouth at bedtime.     pravastatin (PRAVACHOL) 10 MG tablet Take 10 mg by mouth daily.     QUEtiapine (SEROQUEL) 400 MG tablet Take 400 mg by mouth at bedtime.     sucralfate (CARAFATE) 1 GM/10ML suspension Take 2 g by mouth 3 times/day as needed-between meals & bedtime.     SUMAtriptan (IMITREX) 100 MG tablet Take 100 mg by mouth every 2 (two) hours as needed for migraine. May repeat in 2 hours if headache persists or recurs.     Tiotropium Bromide Monohydrate (SPIRIVA RESPIMAT) 2.5 MCG/ACT AERS  Inhale 2 puffs into the lungs daily. 4 g 11   traMADol (ULTRAM) 50 MG tablet Take 50 mg by mouth 4 (four) times daily.     budesonide-formoterol (SYMBICORT) 160-4.5 MCG/ACT inhaler Inhale 2 puffs into the lungs 2 (two) times daily.     IPRATROPIUM BROMIDE HFA IN Inhale 2 puffs into the lungs 4 (four) times daily.     isosorbide mononitrate (IMDUR) 30 MG 24 hr tablet Take 0.5 tablets (15 mg total) by mouth daily. PLEASE CALL OFFICE TO SCHEDULE APPOINTMENT PRIOR TO NEXT REFILL 45 tablet 0   nitrofurantoin, macrocrystal-monohydrate, (MACROBID) 100 MG capsule Take 100 mg by mouth 2 (two) times daily.     Vitamin D, Ergocalciferol, (DRISDOL) 1.25 MG (50000 UNIT) CAPS capsule Take 50,000 Units by mouth once a week.  Facility-Administered Medications Prior to Visit  Medication Dose Route Frequency Provider Last Rate Last Admin   albuterol (PROVENTIL) (2.5 MG/3ML) 0.083% nebulizer solution 2.5 mg  2.5 mg Nebulization Once Raechel Chute, MD

## 2022-12-31 ENCOUNTER — Encounter: Payer: 59 | Admitting: *Deleted

## 2022-12-31 DIAGNOSIS — J439 Emphysema, unspecified: Secondary | ICD-10-CM | POA: Diagnosis not present

## 2022-12-31 NOTE — Progress Notes (Signed)
Daily Session Note  Patient Details  Name: Tammy Maxwell MRN: [id number] Date of Birth: 06-Feb-1960 Referring Provider:   Flowsheet Row Pulmonary Rehab from 09/16/2022 in [facility]  Referring Provider Dr. Raechel Chute, MD       Encounter Date: 12/31/2022  Check In:  Session Check In - 12/31/22 0939       Check-In   Supervising physician immediately available to respond to emergencies See telemetry face sheet for immediately available ER MD    Location [facility]    Staff Present Cora Collum, RN, BSN, CCRP;Kristen Coble, RN,BC,MSN;Jason Wallace Cullens, RDN, Luiz Iron, BS, Exercise Physiologist;Margaret Best, MS, Exercise Physiologist    Virtual Visit No    Medication changes reported     No    Fall or balance concerns reported    No    Warm-up and Cool-down Performed on first and last piece of equipment    Resistance Training Performed Yes    VAD Patient? No    PAD/SET Patient? No      Pain Assessment   Currently in Pain? No/denies                Social History   Tobacco Use  Smoking Status Every Day   Current packs/day: 2.00   Average packs/day: 2.0 packs/day for 49.0 years (98.0 ttl pk-yrs)   Types: Cigarettes   Start date: 1976  Smokeless Tobacco Never  Tobacco Comments   2 cigs daily- 07/23/2022    Goals Met:  Proper associated with RPD/PD & O2 Sat Independence with exercise equipment Exercise tolerated well No report of concerns or symptoms today  Goals Unmet:  Not Applicable  Comments: Pt able to follow exercise prescription today without complaint.  Will continue to monitor for progression.    Dr. Bethann Punches is Medical Director for [facility].  Dr. Vida Rigger is Medical Director for [facility].

## 2023-01-02 ENCOUNTER — Encounter: Payer: 59 | Admitting: *Deleted

## 2023-01-07 ENCOUNTER — Encounter: Payer: 59 | Admitting: *Deleted

## 2023-01-07 DIAGNOSIS — J439 Emphysema, unspecified: Secondary | ICD-10-CM

## 2023-01-07 NOTE — Progress Notes (Signed)
 Daily Session Note  Patient Details  Name: Tammy Maxwell MRN: [id number] Date of Birth: November 11, 1960 Referring Provider:   Flowsheet Row Pulmonary Rehab from 09/16/2022 in [facility]  Referring Provider Dr. Belva November, MD       Encounter Date: 01/07/2023  Check In:  Session Check In - 01/07/23 0956       Check-In   Supervising physician immediately available to respond to emergencies See telemetry face sheet for immediately available ER MD    Location [facility]    Staff Present Othel Durand, RN, BSN, CCRP;Margaret Best, MS, Exercise Physiologist;Noah Tickle, BS, Exercise Physiologist;Maxon Conetta BS, , Exercise Physiologist    Virtual Visit No    Medication changes reported     No    Fall or balance concerns reported    No    Warm-up and Cool-down Performed on first and last piece of equipment    Resistance Training Performed Yes    VAD Patient? No    PAD/SET Patient? No      Pain Assessment   Currently in Pain? No/denies            on antibiotic for abscessed tooth    Social History   Tobacco Use  Smoking Status Every Day   Current packs/day: 2.00   Average packs/day: 2.0 packs/day for 49.0 years (98.0 ttl pk-yrs)   Types: Cigarettes   Start date: 1976  Smokeless Tobacco Never  Tobacco Comments   2 cigs daily- 07/23/2022    Goals Met:  Proper associated with RPD/PD & O2 Sat Independence with exercise equipment Exercise tolerated well No report of concerns or symptoms today  Goals Unmet:  Not Applicable  Comments: Pt able to follow exercise prescription today without complaint.  Will continue to monitor for progression.    Dr. Oneil Pinal is Medical Director for [facility] Card[facility].  Dr. Fuad Aleskerov is Medical Director for [facility].

## 2023-01-14 ENCOUNTER — Encounter: Payer: 59 | Attending: Student in an Organized Health Care Education/Training Program

## 2023-01-14 DIAGNOSIS — J439 Emphysema, unspecified: Secondary | ICD-10-CM | POA: Insufficient documentation

## 2023-01-16 ENCOUNTER — Encounter: Payer: 59 | Admitting: *Deleted

## 2023-01-16 ENCOUNTER — Inpatient Hospital Stay: Payer: 59 | Attending: Internal Medicine

## 2023-01-16 ENCOUNTER — Inpatient Hospital Stay: Payer: 59 | Admitting: Internal Medicine

## 2023-01-21 ENCOUNTER — Ambulatory Visit: Payer: 59

## 2023-01-22 ENCOUNTER — Encounter: Payer: Self-pay | Admitting: Internal Medicine

## 2023-01-22 DIAGNOSIS — J439 Emphysema, unspecified: Secondary | ICD-10-CM

## 2023-01-22 NOTE — Progress Notes (Signed)
 Pulmonary Individual Treatment Plan  Patient Details  Name: Tammy Maxwell MRN: [id number] Date of Birth: January 04, 1961 Referring Provider:   Flowsheet Row Pulmonary Rehab from 09/16/2022 in [facility] Cardiac and Pulmonary Rehab  Referring Provider Dr. Vergia Glasgow, MD       Initial Encounter Date:  Flowsheet Row Pulmonary Rehab from 09/16/2022 in [facility] Cardiac and Pulmonary Rehab  Date 09/16/22       Visit Diagnosis: Pulmonary emphysema, unspecified emphysema type (HCC)  Patient's Home Medications on Admission:  Current Outpatient Medications:    albuterol  (PROVENTIL ) (2.5 MG/3ML) 0.083% nebulizer solution, Take 2.5 mg by nebulization 4 (four) times daily., Disp: , Rfl:    aspirin 81 MG chewable tablet, Chew 81 mg by mouth daily., Disp: , Rfl:    buPROPion (WELLBUTRIN SR) 150 MG 12 hr tablet, Take 150 mg by mouth 2 (two) times daily., Disp: , Rfl:    clonazePAM (KLONOPIN) 1 MG tablet, Take 1 mg by mouth at bedtime., Disp: , Rfl:    cyanocobalamin  (VITAMIN B12) 500 MCG tablet, Take 500 mcg by mouth daily., Disp: , Rfl:    ferrous sulfate 324 MG TBEC, Take 324 mg by mouth., Disp: , Rfl:    fluticasone -salmeterol (ADVAIR HFA) 230-21 MCG/ACT inhaler, Inhale 2 puffs into the lungs 2 (two) times daily., Disp: 1 each, Rfl: 12   levothyroxine (SYNTHROID) 50 MCG tablet, Take 50 mcg by mouth daily., Disp: , Rfl:    meloxicam  (MOBIC ) 15 MG tablet, Take 15 mg by mouth at bedtime., Disp: , Rfl:    naloxone (NARCAN) nasal spray 4 mg/0.1 mL, SMARTSIG:Both Nares, Disp: , Rfl:    nicotine  (NICODERM CQ  - DOSED IN MG/24 HOURS) 14 mg/24hr patch, Place 14 mg onto the skin daily., Disp: , Rfl:    nicotine  polacrilex (NICOTINE  MINI) 2 MG lozenge, Take 1 lozenge (2 mg total) by mouth every 2 (two) hours as needed for smoking cessation., Disp: 72 lozenge, Rfl: 3   nitroGLYCERIN  (NITROSTAT ) 0.3 MG SL tablet, Place 0.3 mg under the tongue every 5 (five) minutes as needed for chest pain., Disp: , Rfl:    omeprazole  (PRILOSEC) 40 MG capsule, Take 40 mg by mouth 2 (two) times daily., Disp: , Rfl:    PARoxetine (PAXIL) 20 MG tablet, Take 20 mg by mouth at bedtime., Disp: , Rfl:    pravastatin (PRAVACHOL) 10 MG tablet, Take 10 mg by mouth daily., Disp: , Rfl:    QUEtiapine (SEROQUEL) 400 MG tablet, Take 400 mg by mouth at bedtime., Disp: , Rfl:    sucralfate (CARAFATE) 1 GM/10ML suspension, Take 2 g by mouth 3 times/day as needed-between meals & bedtime., Disp: , Rfl:    SUMAtriptan (IMITREX) 100 MG tablet, Take 100 mg by mouth every 2 (two) hours as needed for migraine. May repeat in 2 hours if headache persists or recurs., Disp: , Rfl:    Tiotropium Bromide Monohydrate  (SPIRIVA  RESPIMAT) 2.5 MCG/ACT AERS, Inhale 2 puffs into the lungs daily., Disp: 4 g, Rfl: 11   traMADol (ULTRAM) 50 MG tablet, Take 50 mg by mouth 4 (four) times daily., Disp: , Rfl:  No current facility-administered medications for this visit.  Facility-Administered Medications Ordered in Other Visits:    albuterol  (PROVENTIL ) (2.5 MG/3ML) 0.083% nebulizer solution 2.5 mg, 2.5 mg, Nebulization, Once, Vergia Glasgow, MD  Past Medical History: Past Medical History:  Diagnosis Date   Anemia    Arthritis    Bronchitis    Chest pain    COPD (chronic obstructive pulmonary disease) (HCC)  Depression    Dysrhythmia    GERD (gastroesophageal reflux disease)    Headache    Heart murmur    History of hiatal hernia    Pneumonia    Pneumothorax    Wears dentures    full upper    Tobacco Use: Social History   Tobacco Use  Smoking Status Every Day   Current packs/day: 2.00   Average packs/day: 2.0 packs/day for 49.0 years (98.1 ttl pk-yrs)   Types: Cigarettes   Start date: 1976  Smokeless Tobacco Never  Tobacco Comments   2 cigs daily- 07/23/2022    Labs: Review Flowsheet       Latest Ref Rng & Units 02/13/2022  Labs for [facility]P Cardiac and Pulmonary Rehab  Cholestrol 0 - 200 mg/dL 329   LDL (calc) 0 - 99 mg/dL 97   HDL-C >51  mg/dL 73   Trlycerides <884 mg/dL 87      Pulmonary Assessment Scores:  Pulmonary Assessment Scores     Row Name 09/16/22 1336         ADL UCSD   ADL Phase Entry     SOB Score total 82     Rest 3     Walk 4     Stairs 5     Bath 4     Dress 3     Shop 0       CAT Score   CAT Score 36       mMRC Score   mMRC Score 3              UCSD: Self-administered rating of dyspnea associated with activities of daily living (ADLs) 6-point scale (0 = "not at all" to 5 = "maximal or unable to do because of breathlessness")  Scoring Scores range from 0 to 120.  Minimally important difference is 5 units  CAT: CAT can identify the health impairment of COPD patients and is better correlated with disease progression.  CAT has a scoring range of zero to 40. The CAT score is classified into four groups of low (less than 10), medium (10 - 20), high (21-30) and very high (31-40) based on the impact level of disease on health status. A CAT score over 10 suggests significant symptoms.  A worsening CAT score could be explained by an exacerbation, poor medication adherence, poor inhaler technique, or progression of COPD or comorbid conditions.  CAT MCID is 2 points  mMRC: mMRC (Modified Medical Research Council) Dyspnea Scale is used to assess the degree of baseline functional disability in patients of respiratory disease due to dyspnea. No minimal important difference is established. A decrease in score of 1 point or greater is considered a positive change.   Pulmonary Function Assessment:   Exercise Target Goals: Exercise Program Goal: Individual exercise prescription set using results from initial 6 min walk test and THRR while considering  patient's activity barriers and safety.   Exercise Prescription Goal: Initial exercise prescription builds to 30-45 minutes a day of aerobic activity, 2-3 days per week.  Home exercise guidelines will be given to patient during program as part of  exercise prescription that the participant will acknowledge.  Education: Aerobic Exercise: - Group verbal and visual presentation on the components of exercise prescription. Introduces F.I.T.T principle from ACSM for exercise prescriptions.  Reviews F.I.T.T. principles of aerobic exercise including progression. Written material given at graduation. Flowsheet Row Pulmonary Rehab from 12/03/2022 in [facility] Cardiac and Pulmonary Rehab  Education need identified 09/16/22  Date  11/07/22  Educator MB  Instruction Review Code 1- Verbalizes Understanding       Education: Resistance Exercise: - Group verbal and visual presentation on the components of exercise prescription. Introduces F.I.T.T principle from ACSM for exercise prescriptions  Reviews F.I.T.T. principles of resistance exercise including progression. Written material given at graduation.    Education: Exercise & Equipment Safety: - Individual verbal instruction and demonstration of equipment use and safety with use of the equipment. Flowsheet Row Pulmonary Rehab from 12/03/2022 in [facility] Cardiac and Pulmonary Rehab  Date 09/16/22  Educator NT  Instruction Review Code 1- Verbalizes Understanding       Education: Exercise Physiology & General Exercise Guidelines: - Group verbal and written instruction with models to review the exercise physiology of the cardiovascular system and associated critical values. Provides general exercise guidelines with specific guidelines to those with heart or lung disease.  Flowsheet Row Pulmonary Rehab from 12/03/2022 in [facility] Cardiac and Pulmonary Rehab  Date 10/24/22  Educator MB  Instruction Review Code 1- [employer] Understanding       Education: Flexibility, Balance, Mind/Body Relaxation: - Group verbal and visual presentation with interactive activity on the components of exercise prescription. Introduces F.I.T.T principle from ACSM for exercise prescriptions. Reviews F.I.T.T. principles of  flexibility and balance exercise training including progression. Also discusses the mind body connection.  Reviews various relaxation techniques to help reduce and manage stress (i.e. Deep breathing, progressive muscle relaxation, and visualization). Balance handout provided to take home. Written material given at graduation.   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 HR 107 bpm     Max Ex. BP 124/72     2 Minute Post BP 112/68       Interval HR   1 Minute HR 101     2 Minute HR 105     3 Minute HR 106     4 Minute HR 106     5 Minute HR 107     6 Minute HR 106     2 Minute Post HR 98     Interval Heart Rate? Yes       Interval Oxygen   Interval Oxygen? Yes     Baseline Oxygen Saturation % 99 %     1 Minute Oxygen Saturation % 99 %     1 Minute Liters of Oxygen  3 L     2 Minute Oxygen Saturation % 97 %     2 Minute Liters of Oxygen 3 L     3 Minute Oxygen Saturation % 97 %     3 Minute Liters of Oxygen 3 L     4 Minute Oxygen Saturation % 98 %     4 Minute Liters of Oxygen 3 L     5 Minute Oxygen Saturation % 97 %     5 Minute Liters of Oxygen 3 L     6 Minute Oxygen Saturation % 94 %     6 Minute Liters of Oxygen 3 L     2 Minute Post Oxygen  Saturation % 95 %     2 Minute Post Liters of Oxygen 3 L             Oxygen Initial Assessment:  Oxygen Initial Assessment - 08/26/22 1552       Home Oxygen   Home Oxygen Device Home Concentrator;E-Tanks    Sleep Oxygen Prescription Continuous    Liters per minute 2.5    Home Exercise Oxygen Prescription Pulsed    Liters per minute 2.5    Home Resting Oxygen Prescription Pulsed    Liters per minute 2.5    Compliance with Home Oxygen Use Yes      Intervention   Short Term Goals To learn and exhibit compliance with exercise, home and travel O2 prescription;To learn and understand importance of maintaining oxygen saturations>88%;To learn and demonstrate proper use of respiratory medications;To learn and understand importance of monitoring SPO2 with pulse oximeter and demonstrate accurate use of the pulse oximeter.;To learn and demonstrate proper pursed lip breathing techniques or other breathing techniques.     Long  Term Goals Exhibits compliance with exercise, home  and travel O2 prescription;Maintenance of O2 saturations>88%;Compliance with respiratory medication;Verbalizes importance of monitoring SPO2 with pulse oximeter and return demonstration;Exhibits proper breathing techniques, such as pursed lip breathing or other method taught during program session             Oxygen Re-Evaluation:  Oxygen Re-Evaluation     Row Name 09/19/22 1009 10/24/22 0940 12/31/22 0954         Program Oxygen Prescription   Program Oxygen Prescription -- Continuous;E-Tanks Continuous;E-Tanks     Liters per minute -- 2 2       Home Oxygen   Home Oxygen Device -- Home Concentrator;E-Tanks Home Concentrator;E-Tanks     Sleep Oxygen Prescription -- Continuous Continuous     Liters per minute -- 2.5 2.5     Home Exercise Oxygen Prescription -- Continuous Continuous     Liters per minute -- 2.5 2.5     Home Resting Oxygen Prescription -- Continuous Continuous     Liters per minute -- 2.5 --      Compliance with Home Oxygen Use -- Yes Yes       Goals/Expected Outcomes   Short Term Goals -- To learn and demonstrate proper pursed lip breathing techniques or other breathing techniques.  To learn and demonstrate proper pursed lip breathing techniques or other breathing techniques.      Long  Term Goals -- Exhibits proper breathing techniques, such as pursed lip breathing or other method taught during program session Exhibits proper breathing techniques, such as pursed lip breathing or other method taught during program session     Comments Reviewed PLB technique with pt.  Talked about how it works and it's importance in maintaining  their exercise saturations. Informed patient how to perform the Pursed Lipped breathing technique. Told patient to Inhale through the nose and out the mouth with pursed lips to keep their airways open, help oxygenate them better, practice when at rest or doing strenuous activity. Patient Verbalizes understanding of technique and will work on and be reiterated during [facility]. Hope states that she continues to practice PLB. She does believe that her breathing has improved with exercise since practining PLB. She continues to be consistent and compliant with all of her pulmonary medications.     Goals/Expected Outcomes Short: Become more profiecient at using PLB.   Long: Become independent at using PLB. Short: use PLB with exertion. Long: use PLB on exertion proficiently and independently. Short: use PLB with exertion. Long: use PLB on exertion proficiently and independently.              Oxygen Discharge (Final Oxygen Re-Evaluation):  Oxygen Re-Evaluation - 12/31/22 0954       Program Oxygen Prescription   Program Oxygen Prescription Continuous;E-Tanks    Liters per minute 2      Home Oxygen   Home Oxygen Device Home Concentrator;E-Tanks    Sleep Oxygen Prescription Continuous    Liters per minute 2.5    Home Exercise Oxygen Prescription Continuous     Liters per minute 2.5    Home Resting Oxygen Prescription Continuous    Compliance with Home Oxygen Use Yes      Goals/Expected Outcomes   Short Term Goals To learn and demonstrate proper pursed lip breathing techniques or other breathing techniques.     Long  Term Goals Exhibits proper breathing techniques, such as pursed lip breathing or other method taught during program session    Comments Hope states that she continues to practice PLB. She does believe that her breathing has improved with exercise since practining PLB. She continues to be consistent and compliant with all of her pulmonary medications.    Goals/Expected Outcomes Short: use PLB with exertion. Long: use PLB on exertion proficiently and independently.             Initial Exercise Prescription:  Initial Exercise Prescription - 09/16/22 1300       Date of Initial Exercise RX and Referring Provider   Date 09/16/22    Referring Provider Dr. Vergia Glasgow, MD      Oxygen   Oxygen Continuous    Liters 3    Maintain Oxygen Saturation 88% or higher      Treadmill   MPH 1.8    Grade 0.5    Minutes 15    METs 2.5      Recumbant Bike   Level 2    RPM 50    Watts 25    Minutes 15    METs 3.66      NuStep   Level 2    SPM 80    Minutes 15    METs 3.66      Prescription Details   Frequency (times per week) 2    Duration Progress to 30 minutes of continuous aerobic without signs/symptoms of physical distress      Intensity   THRR 40-80% of Max Heartrate 109-143    Ratings of Perceived Exertion 11-13    Perceived Dyspnea 0-4      Progression   Progression Continue to progress workloads to maintain intensity without signs/symptoms of physical distress.      Resistance Training   Training Prescription Yes    Weight  3 lb    Reps 10-15             Perform Capillary Blood Glucose checks as needed.  Exercise Prescription Changes:   Exercise Prescription Changes     Row Name 09/16/22 1300  09/24/22 1400 10/11/22 0900 10/25/22 0900 11/07/22 1500     Response to Exercise   Blood Pressure (Admit) 116/70 100/62 100/60 120/72 96/60   Blood Pressure (Exercise) 124/72 108/62 134/72 126/66 108/62   Blood Pressure (Exit) 112/68 98/62 102/60 102/60 102/64   Heart Rate (Admit) 75 bpm 75 bpm 87 bpm 69 bpm 80 bpm   Heart Rate (Exercise) 107 bpm 92 bpm 96 bpm 91 bpm 100 bpm   Heart Rate (Exit) 98 bpm 80 bpm 80 bpm 69 bpm 84 bpm   Oxygen Saturation (Admit) 99 % 97 % 97 % 97 % 96 %   Oxygen Saturation (Exercise) 94 % 97 % 90 % 85 % 84 %   Oxygen Saturation (Exit) 95 % 98 % 96 % 98 % 92 %   Rating of Perceived Exertion (Exercise) 9 9 13 13 14    Perceived Dyspnea (Exercise) 1 1 1 1 2    Symptoms Fatigue none none none none   Comments Results -- -- -- --   Duration -- Progress to 30 minutes of  aerobic without signs/symptoms of physical distress Progress to 30 minutes of  aerobic without signs/symptoms of physical distress Progress to 30 minutes of  aerobic without signs/symptoms of physical distress Progress to 30 minutes of  aerobic without signs/symptoms of physical distress   Intensity -- THRR unchanged THRR unchanged THRR unchanged THRR unchanged     Progression   Progression -- Continue to progress workloads to maintain intensity without signs/symptoms of physical distress. Continue to progress workloads to maintain intensity without signs/symptoms of physical distress. Continue to progress workloads to maintain intensity without signs/symptoms of physical distress. Continue to progress workloads to maintain intensity without signs/symptoms of physical distress.   Average METs -- 2.5 2.31 2.27 2.03     Resistance Training   Training Prescription -- Yes Yes Yes Yes   Weight -- 3 lb 3 lb 3 lb 3 lb   Reps -- 10-15 10-15 10-15 10-15     Interval Training   Interval Training -- No No No No     Oxygen   Oxygen -- Continuous Continuous Continuous Continuous   Liters -- 3 3 3 2       Treadmill   MPH -- -- -- 1.2 1.4   Grade -- -- -- 0 0   Minutes -- -- -- 15 15   METs -- -- -- 1.92 2.07     Recumbant Bike   Level -- 1 1 2  --   Watts -- 12 12 12  --   Minutes -- 15 15 15  --   METs -- 2.5 2.77 2.78 --     NuStep   Level -- 1 2 3 3    Minutes -- 15 15 15 15    METs -- 2.5 1.4 2.1 2.1     Biostep-RELP   Level -- -- -- 1 1   Minutes -- -- -- 15 15   METs -- -- -- -- 2     Oxygen   Maintain Oxygen Saturation -- 88% or higher 88% or higher 88% or higher 88% or higher    Row Name 12/04/22 1000 12/12/22 1000 12/19/22 1600 01/02/23 1500 01/17/23 0900     Response to Exercise  Blood Pressure (Admit) 122/68 -- 110/70 100/60 98/56   Blood Pressure (Exit) -- -- 102/54 122/66 102/60   Heart Rate (Admit) 84 bpm -- 83 bpm 74 bpm 74 bpm   Heart Rate (Exercise) 83 bpm -- 104 bpm 92 bpm 112 bpm   Heart Rate (Exit) 76 bpm -- 81 bpm 94 bpm 87 bpm   Oxygen Saturation (Admit) 97 % -- 92 % 95 % 98 %   Oxygen Saturation (Exercise) 89 % -- 91 % 90 % 95 %   Oxygen Saturation (Exit) 96 % -- 96 % 94 % 98 %   Rating of Perceived Exertion (Exercise) 13 -- 13 13 13    Perceived Dyspnea (Exercise) 1 -- 1 1 1    Symptoms none -- none none none   Duration Progress to 30 minutes of  aerobic without signs/symptoms of physical distress -- Progress to 30 minutes of  aerobic without signs/symptoms of physical distress Progress to 30 minutes of  aerobic without signs/symptoms of physical distress Progress to 30 minutes of  aerobic without signs/symptoms of physical distress   Intensity THRR unchanged -- THRR unchanged THRR unchanged THRR unchanged     Progression   Progression Continue to progress workloads to maintain intensity without signs/symptoms of physical distress. -- Continue to progress workloads to maintain intensity without signs/symptoms of physical distress. Continue to progress workloads to maintain intensity without signs/symptoms of physical distress. Continue to progress workloads  to maintain intensity without signs/symptoms of physical distress.   Average METs 1.85 -- 2.25 2.19 2.13     Resistance Training   Training Prescription Yes -- Yes Yes Yes   Weight 3 lb -- 3 lb 3 lb 3 lb   Reps 10-15 -- 10-15 10-15 10-15     Interval Training   Interval Training No -- No No No     Oxygen   Oxygen Continuous -- Continuous Continuous Continuous   Liters 2 -- 2 2 2      Treadmill   MPH -- -- 1.8 1.8 1.5   Grade -- -- 0 0 0   Minutes -- -- 15 15 15    METs -- -- 2.38 2.38 2.15     NuStep   Level 2 -- 5 3 3    Minutes 15 -- 30 15 15    METs 2.1 -- 2.7 2 2.1     Home Exercise Plan   Plans to continue exercise at -- Home (comment)  Walking outside and using 3 lb hand weights Home (comment)  Walking outside and using 3 lb hand weights Home (comment)  Walking outside and using 3 lb hand weights Home (comment)  Walking outside and using 3 lb hand weights   Frequency -- Add 3 additional days to program exercise sessions. Add 3 additional days to program exercise sessions. Add 3 additional days to program exercise sessions. Add 3 additional days to program exercise sessions.   Initial Home Exercises Provided -- 12/12/22 12/12/22 12/12/22 12/12/22     Oxygen   Maintain Oxygen Saturation 88% or higher 88% or higher 88% or higher 88% or higher 88% or higher            Exercise Comments:   Exercise Comments     Row Name 09/19/22 1008           Exercise Comments First full day of exercise!  Patient was oriented to gym and equipment including functions, settings, policies, and procedures.  Patient's individual exercise prescription and treatment plan were reviewed.  All starting  workloads were established based on the results of the 6 minute walk test done at initial orientation visit.  The plan for exercise progression was also introduced and progression will be customized based on patient's performance and goals.                Exercise Goals and Review:    Exercise Goals     Row Name 09/16/22 1342             Exercise Goals   Increase Physical Activity Yes       Intervention Provide advice, education, support and counseling about physical activity/exercise needs.;Develop an individualized exercise prescription for aerobic and resistive training based on initial evaluation findings, risk stratification, comorbidities and participant's personal goals.       Expected Outcomes Short Term: Attend rehab on a regular basis to increase amount of physical activity.;Long Term: Add in home exercise to make exercise part of routine and to increase amount of physical activity.;Long Term: Exercising regularly at least 3-5 days a week.       Increase Strength and Stamina Yes       Intervention Provide advice, education, support and counseling about physical activity/exercise needs.;Develop an individualized exercise prescription for aerobic and resistive training based on initial evaluation findings, risk stratification, comorbidities and participant's personal goals.       Expected Outcomes Short Term: Increase workloads from initial exercise prescription for resistance, speed, and METs.;Short Term: Perform resistance training exercises routinely during rehab and add in resistance training at home;Long Term: Improve cardiorespiratory fitness, muscular endurance and strength as measured by increased METs and functional capacity ( )       Able to understand and use rate of perceived exertion (RPE) scale Yes       Intervention Provide education and explanation on how to use RPE scale       Expected Outcomes Short Term: Able to use RPE daily in rehab to express subjective intensity level;Long Term:  Able to use RPE to guide intensity level when exercising independently       Able to understand and use Dyspnea scale Yes       Intervention Provide education and explanation on how to use Dyspnea scale       Expected Outcomes Short Term: Able to use Dyspnea scale  daily in rehab to express subjective sense of shortness of breath during exertion;Long Term: Able to use Dyspnea scale to guide intensity level when exercising independently       Knowledge and understanding of Target Heart Rate Range (THRR) Yes       Intervention Provide education and explanation of THRR including how the numbers were predicted and where they are located for reference       Expected Outcomes Short Term: Able to state/look up THRR;Long Term: Able to use THRR to govern intensity when exercising independently;Short Term: Able to use daily as guideline for intensity in rehab       Able to check pulse independently Yes       Intervention Provide education and demonstration on how to check pulse in carotid and radial arteries.;Review the importance of being able to check your own pulse for safety during independent exercise       Expected Outcomes Short Term: Able to explain why pulse checking is important during independent exercise;Long Term: Able to check pulse independently and accurately       Understanding of Exercise Prescription Yes       Intervention Provide education, explanation, and  written materials on patient's individual exercise prescription       Expected Outcomes Short Term: Able to explain program exercise prescription;Long Term: Able to explain home exercise prescription to exercise independently                Exercise Goals Re-Evaluation :  Exercise Goals Re-Evaluation     Row Name 09/19/22 1008 09/24/22 1437 10/11/22 0934 10/25/22 0943 11/07/22 1545     Exercise Goal Re-Evaluation   Exercise Goals Review Increase Physical Activity;Able to understand and use rate of perceived exertion (RPE) scale;Knowledge and understanding of Target Heart Rate Range (THRR);Understanding of Exercise Prescription;Increase Strength and Stamina;Able to check pulse independently;Able to understand and use Dyspnea scale Increase Physical Activity;Increase Strength and  Stamina;Understanding of Exercise Prescription Increase Physical Activity;Increase Strength and Stamina;Understanding of Exercise Prescription Increase Physical Activity;Increase Strength and Stamina;Understanding of Exercise Prescription Increase Physical Activity;Increase Strength and Stamina;Understanding of Exercise Prescription   Comments Reviewed RPE  and dyspnea scale, THR and program prescription with pt today.  Pt voiced understanding and was given a copy of goals to take home. Hope is off to a good start in the program. She attended her first and only appointment during this review. During the session she used the T4 nustep and recumbent bike at level 1. We will continue to monitor her progress in the program. Hope is doing well in the program. She continues to do well at level 1 on the recumbent bike and improved to level 2 on the T4 nustep. She also continues to do well with 3 lb hand weights for resistance training. We will continue to monitor her progress in the program. Hope continues to do well in the program. She recently improved to level 2 on the recumbent bike and level 3 on the T4 nustep. She also began using the treadmill at a speed of 1.2 mph with no incline. We will continue to monitor her progress in the program. Hope is doing well in rehab. She increased her treadmill workload to a speed of 1.4 mph with no incline. She also has stayed consistent on the biostep at level 1 and T4 nustep at level 3. We will continue to monitor her progress in the program.   Expected Outcomes Short: Use RPE daily to regulate intensity.  Long: Follow program prescription in THR. Short: Continue to follow current exercise prescription. Long: Continue exercise to improve strength and stamina. Short: Try level 2 on the recumbent bike. Long: Continue exercise to improve strength and stamina. Short: Continue to walk on treadmill. Long: Continue exercise to improve strength and stamina. Short: Increase to level 2 on  the biostep. Long: Continue exercise to improve strength and stamina.    Row Name 12/04/22 1052 12/12/22 1013 12/19/22 1607 12/31/22 0937 01/02/23 1508     Exercise Goal Re-Evaluation   Exercise Goals Review Increase Physical Activity;Increase Strength and Stamina;Understanding of Exercise Prescription -- Increase Physical Activity;Increase Strength and Stamina;Understanding of Exercise Prescription Increase Physical Activity;Increase Strength and Stamina;Understanding of Exercise Prescription Increase Physical Activity;Increase Strength and Stamina;Understanding of Exercise Prescription   Comments Hope continues to do well in rehab. She was only able to attend one session during this review. During the one session she only did the T4 nustep at level 2. We will continue to monitor her progress in the program. Reviewed home exercise with pt today from 09:39 to 09:50.  Pt plans to walk outside at home under the right weather conditions and use 3 lb hand weights for resistance  training as well.  Reviewed THR, pulse, RPE, sign and symptoms, pulse oximetery and when to call 911 or MD.  Also discussed weather considerations and indoor options.  Pt voiced understanding. Hope continues to do well in the program. She continues to walk on the treadmill at a speed of 1.8 mph with no incline. She also improved to level 5 on the T4 nustep and was able to work for 30 minutes on this machine. We will continue to monitor her progress in the program. Hope has been doing well with exercise. She states that she has not been walking at home on days away from rehab due to the cold weather. However, she does state that she has space at home inside where she can walk and she can walk her stairs as well. She also states that she has noticed some benefits from exercising in rehab, specifically that she has more energy and more stamina. Her pulmonologist also noticed some progress, and has encouraged her to continue with her exercise  as well. We will continue to monitor her progress. Hope was only able to attend one rehab session during this review period. During this one session she was able to maintain her workload on the treadmill at 1.35mph. She also used the T4 nustep but did decrease her level to 3. We will continue to monitor her progress in the program.   Expected Outcomes Short; attend rehab more regularyly. Long: Continue exercise to improve strength and stamina. Short: Begin walking at home on days away from rehab. Long: Continue exercise to improve strength and stamina. Short: Begin to progressively increase treadmill workload. Long: Continue exercise to improve strength and stamina. Short: Begin walking at home on days away from rehab. Long: Continue exercise to improve strength and stamina. Short: Increase T4 nustep level to 5. Long: continue exercise to increase strength and stamina.    Row Name 01/17/23 0932             Exercise Goal Re-Evaluation   Exercise Goals Review Increase Physical Activity;Increase Strength and Stamina;Understanding of Exercise Prescription       Comments Hope continues to do well in rehab. She is doing well on the treadmill at a speed of 1.5 mph with no incline. She also continues to do well at level 3 on the T4 nustep. We will continue to monitor her progress in the program.       Expected Outcomes Short: Increase T4 nustep level to 4. Long: continue exercise to increase strength and stamina.                Discharge Exercise Prescription (Final Exercise Prescription Changes):  Exercise Prescription Changes - 01/17/23 0900       Response to Exercise   Blood Pressure (Admit) 98/56    Blood Pressure (Exit) 102/60    Heart Rate (Admit) 74 bpm    Heart Rate (Exercise) 112 bpm    Heart Rate (Exit) 87 bpm    Oxygen Saturation (Admit) 98 %    Oxygen Saturation (Exercise) 95 %    Oxygen Saturation (Exit) 98 %    Rating of Perceived Exertion (Exercise) 13    Perceived Dyspnea  (Exercise) 1    Symptoms none    Duration Progress to 30 minutes of  aerobic without signs/symptoms of physical distress    Intensity THRR unchanged      Progression   Progression Continue to progress workloads to maintain intensity without signs/symptoms of physical distress.    Average METs  2.13      Resistance Training   Training Prescription Yes    Weight 3 lb    Reps 10-15      Interval Training   Interval Training No      Oxygen   Oxygen Continuous    Liters 2      Treadmill   MPH 1.5    Grade 0    Minutes 15    METs 2.15      NuStep   Level 3    Minutes 15    METs 2.1      Home Exercise Plan   Plans to continue exercise at Home (comment)   Walking outside and using 3 lb hand weights   Frequency Add 3 additional days to program exercise sessions.    Initial Home Exercises Provided 12/12/22      Oxygen   Maintain Oxygen Saturation 88% or higher             Nutrition:  Target Goals: Understanding of nutrition guidelines, daily intake of sodium 1500mg , cholesterol 200mg , calories 30% from fat and 7% or less from saturated fats, daily to have 5 or more servings of fruits and vegetables.  Education: All About Nutrition: -Group instruction provided by verbal, written material, interactive activities, discussions, models, and posters to present general guidelines for heart healthy nutrition including fat, fiber, MyPlate, the role of sodium in heart healthy nutrition, utilization of the nutrition label, and utilization of this knowledge for meal planning. Follow up email sent as well. Written material given at graduation.   Biometrics:  Pre Biometrics - 09/16/22 1343       Pre Biometrics   Height 5\' 8"  (1.727 m)    Weight 106 lb 1.6 oz (48.1 kg)    Waist Circumference 26.5 inches    Hip Circumference 33 inches    Waist to Hip Ratio 0.8 %    BMI (Calculated) 16.14    Single Leg Stand 4.8 seconds              Nutrition Therapy Plan and Nutrition  Goals:  Nutrition Therapy & Goals - 09/16/22 1332       Nutrition Therapy   RD appointment deferred Yes      Intervention Plan   Intervention Prescribe, educate and counsel regarding individualized specific dietary modifications aiming towards targeted core components such as weight, hypertension, lipid management, diabetes, heart failure and other comorbidities.    Expected Outcomes Short Term Goal: Understand basic principles of dietary content, such as calories, fat, sodium, cholesterol and nutrients.;Short Term Goal: A plan has been developed with personal nutrition goals set during dietitian appointment.;Long Term Goal: Adherence to prescribed nutrition plan.             Nutrition Assessments:  MEDIFICTS Score Key: >=70 Need to make dietary changes  40-70 Heart Healthy Diet <= 40 Therapeutic Level Cholesterol Diet  Flowsheet Row Pulmonary Rehab from 09/16/2022 in [facility] Cardiac and Pulmonary Rehab  Picture Your Plate Total Score on Admission 52      Picture Your Plate Scores: <24 Unhealthy dietary pattern with much room for improvement. 41-50 Dietary pattern unlikely to meet recommendations for good health and room for improvement. 51-60 More healthful dietary pattern, with some room for improvement.  >60 Healthy dietary pattern, although there may be some specific behaviors that could be improved.   Nutrition Goals Re-Evaluation:  Nutrition Goals Re-Evaluation     Row Name 10/24/22 [id number] 12/31/22 [phone]  Goals   Current Weight 111 lb (50.3 kg) 112 lb 4.8 oz (50.9 kg)      Comment Patient was informed on why it is important to maintain a balanced diet when dealing with Respiratory issues. Explained that it takes a lot of energy to breath and when they are short of breath often they will need to have a good diet to help keep up with the calories they are expending for breathing. Hope has not met with the RD but would like to. We will schedule her an appt to meet  with the RD. She states that she has a poor appetite and does not want to eat most of the time.      Expected Outcome Short: Choose and plan snacks accordingly to patients caloric intake to improve breathing. Long: Maintain a diet independently that meets their caloric intake to aid in daily shortness of breath. Short: Meet with RD. Long: Continue to maintain a diet that meets caloric intake to aid in daily SOB.               Nutrition Goals Discharge (Final Nutrition Goals Re-Evaluation):  Nutrition Goals Re-Evaluation - 12/31/22 0948       Goals   Current Weight 112 lb 4.8 oz (50.9 kg)    Comment Hope has not met with the RD but would like to. We will schedule her an appt to meet with the RD. She states that she has a poor appetite and does not want to eat most of the time.    Expected Outcome Short: Meet with RD. Long: Continue to maintain a diet that meets caloric intake to aid in daily SOB.             Psychosocial: Target Goals: Acknowledge presence or absence of significant depression and/or stress, maximize coping skills, provide positive support system. Participant is able to verbalize types and ability to use techniques and skills needed for reducing stress and depression.   Education: Stress, Anxiety, and Depression - Group verbal and visual presentation to define topics covered.  Reviews how body is impacted by stress, anxiety, and depression.  Also discusses healthy ways to reduce stress and to treat/manage anxiety and depression.  Written material given at graduation.   Education: Sleep Hygiene -Provides group verbal and written instruction about how sleep can affect your health.  Define sleep hygiene, discuss sleep cycles and impact of sleep habits. Review good sleep hygiene tips.    Initial Review & Psychosocial Screening:  Initial Psych Review & Screening - 08/26/22 1631       Initial Review   Current issues with Current Psychotropic Meds      Family  Dynamics   Good Support System? Yes   Lives alone but her son lives close by and is great support!     Screening Interventions   Interventions Encouraged to exercise    Expected Outcomes Short Term goal: Utilizing psychosocial counselor, staff and physician to assist with identification of specific Stressors or current issues interfering with healing process. Setting desired goal for each stressor or current issue identified.;Long Term Goal: Stressors or current issues are controlled or eliminated.;Short Term goal: Identification and review with participant of any Quality of Life or Depression concerns found by scoring the questionnaire.;Long Term goal: The participant improves quality of Life and PHQ9 Scores as seen by post scores and/or verbalization of changes             Quality of Life Scores:  Scores of  19 and below usually indicate a poorer quality of life in these areas.  A difference of  2-3 points is a clinically meaningful difference.  A difference of 2-3 points in the total score of the Quality of Life Index has been associated with significant improvement in overall quality of life, self-image, physical symptoms, and general health in studies assessing change in quality of life.  PHQ-9: Review Flowsheet       12/31/2022 10/24/2022 09/16/2022 03/18/2022  Depression screen PHQ 2/9  Decreased Interest 2 2 2 1   Down, Depressed, Hopeless 3 1 2 1   PHQ - 2 Score 5 3 4 2   Altered sleeping 3 1 1  -  Tired, decreased energy 2 2 2  -  Change in appetite 1 0 1 -  Feeling bad or failure about yourself  2 2 2  -  Trouble concentrating 2 1 1  -  Moving slowly or fidgety/restless 0 1 1 -  Suicidal thoughts 0 0 0 -  PHQ-9 Score 15 10 12  -  Difficult doing work/chores Very difficult Somewhat difficult Somewhat difficult -   Interpretation of Total Score  Total Score Depression Severity:  1-4 = Minimal depression, 5-9 = Mild depression, 10-14 = Moderate depression, 15-19 = Moderately severe  depression, 20-27 = Severe depression   Psychosocial Evaluation and Intervention:  Psychosocial Evaluation - 08/26/22 1635       Psychosocial Evaluation & Interventions   Interventions Encouraged to exercise with the program and follow exercise prescription    Comments [place] "Hope" is coming to pulmonary rehab post emphysema.  She has decreased her tobacco use to now 2 cigarettes per day. She is using nicotene patch and already has cessation material and declines further information at this time. Hope is looking to improve her breathing as she has been having progressive dyspnea, reduce shortness of breath with ADL's and improve muscle strength from rehab. She lives alone and has a son that lives close by that is great support. Hope currently uses home oxygen @ 2.5L/min pulsed. She does have some balance concerns and uses a cane with ambulation. Advised she bring cane with her to rehab. She has had one fall in the past year, with only injury to her toe. 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 She has no barriers to attending the program and is looking forward to starting rehab.    Expected Outcomes Short: Attend pulmonary rehab for education and exercise.  Long: Develop and maintain positive self care habits.    Continue Psychosocial Services  Follow up required by staff             Psychosocial Re-Evaluation:  Psychosocial Re-Evaluation     Row Name 10/24/22 0945 12/31/22 0941           Psychosocial Re-Evaluation   Current issues with Current Stress Concerns;History of Depression;Current Psychotropic Meds Current Stress Concerns;Current Depression;Current Sleep Concerns;Current Psychotropic Meds      Comments Reviewed patient health questionnaire (PHQ-9) with patient for follow up. Previously, patients score indicated signs/symptoms of depression.  Reviewed to see if patient is improving symptom wise while in program.  Score  improved and patient states that it is because she is able to come to exercise class. Hope states that she is depressed as she is frustrated with herself for not making lifestyle changes sooner to prevent current health concerns. And because of this she has stress with moving forward with her condition. She does report  that her son has been a good support for her and helps take good care of her. She does report that exercise in rehab has been a good stress reliever for her. She states that she is struggling with her sleep as well, as she has trouble falling asleep and staying asleep. We will continue to encourage her to maintain a positive outlook.      Expected Outcomes Short: Continue to attend [facility] regularly for regular exercise and social engagement. Long: Continue to improve symptoms and manage a positive mental state. Short: Continue to attend [facility] regularly for regular exercise and social engagement. Long: Continue to maintain positive mental state.      Interventions Encouraged to attend Pulmonary Rehabilitation for the exercise Encouraged to attend Pulmonary Rehabilitation for the exercise      Continue Psychosocial Services  Follow up required by staff Follow up required by staff               Psychosocial Discharge (Final Psychosocial Re-Evaluation):  Psychosocial Re-Evaluation - 12/31/22 0941       Psychosocial Re-Evaluation   Current issues with Current Stress Concerns;Current Depression;Current Sleep Concerns;Current Psychotropic Meds    Comments Hope states that she is depressed as she is frustrated with herself for not making lifestyle changes sooner to prevent current health concerns. And because of this she has stress with moving forward with her condition. She does report that her son has been a good support for her and helps take good care of her. She does report that exercise in rehab has been a good stress reliever for her. She states that she is struggling with her  sleep as well, as she has trouble falling asleep and staying asleep. We will continue to encourage her to maintain a positive outlook.    Expected Outcomes Short: Continue to attend [facility] regularly for regular exercise and social engagement. Long: Continue to maintain positive mental state.    Interventions Encouraged to attend Pulmonary Rehabilitation for the exercise    Continue Psychosocial Services  Follow up required by staff             Education: Education Goals: Education classes will be provided on a weekly basis, covering required topics. Participant will state understanding/return demonstration of topics presented.  Learning Barriers/Preferences:   General Pulmonary Education Topics:  Infection Prevention: - Provides verbal and written material to individual with discussion of infection control including proper hand washing and proper equipment cleaning during exercise session. Flowsheet Row Pulmonary Rehab from 12/03/2022 in [facility] Cardiac and Pulmonary Rehab  Date 09/16/22  Educator NT  Instruction Review Code 1- Verbalizes Understanding       Falls Prevention: - Provides verbal and written material to individual with discussion of falls prevention and safety. Flowsheet Row Pulmonary Rehab from 12/03/2022 in [facility] Cardiac and Pulmonary Rehab  Date 09/16/22  Educator NT  Instruction Review Code 1- Verbalizes Understanding       Chronic Lung Disease Review: - Group verbal instruction with posters, models, PowerPoint presentations and videos,  to review new updates, new respiratory medications, new advancements in procedures and treatments. Providing information on websites and "800" numbers for continued self-education. Includes information about supplement oxygen, available portable oxygen systems, continuous and intermittent flow rates, oxygen safety, concentrators, and Medicare reimbursement for oxygen. Explanation of Pulmonary Drugs, including class, frequency,  complications, importance of spacers, rinsing mouth after steroid MDI's, and proper cleaning methods for nebulizers. Review of basic lung anatomy and physiology related to function, structure, and  complications of lung disease. Review of risk factors. Discussion about methods for diagnosing sleep apnea and types of masks and machines for OSA. Includes a review of the use of types of environmental controls: home humidity, furnaces, filters, dust mite/pet prevention, HEPA vacuums. Discussion about weather changes, air quality and the benefits of nasal washing. Instruction on Warning signs, infection symptoms, calling MD promptly, preventive modes, and value of vaccinations. Review of effective airway clearance, coughing and/or vibration techniques. Emphasizing that all should Create an Action Plan. Written material given at graduation. Flowsheet Row Pulmonary Rehab from 12/03/2022 in [facility] Cardiac and Pulmonary Rehab  Education need identified 09/16/22  Date 10/03/22  Educator [facility]  Instruction Review Code 1- Verbalizes Understanding       AED/CPR: - Group verbal and written instruction with the use of models to demonstrate the basic use of the AED with the basic ABC's of resuscitation.    Anatomy and Cardiac Procedures: - Group verbal and visual presentation and models provide information about basic cardiac anatomy and function. Reviews the testing methods done to diagnose heart disease and the outcomes of the test results. Describes the treatment choices: Medical Management, Angioplasty, or Coronary Bypass Surgery for treating various heart conditions including Myocardial Infarction, Angina, Valve Disease, and Cardiac Arrhythmias.  Written material given at graduation. Flowsheet Row Pulmonary Rehab from 12/03/2022 in [facility] Cardiac and Pulmonary Rehab  Education need identified 09/16/22  Date 09/19/22  Educator SB  Instruction Review Code 1- Verbalizes Understanding       Medication Safety: -  Group verbal and visual instruction to review commonly prescribed medications for heart and lung disease. Reviews the medication, class of the drug, and side effects. Includes the steps to properly store meds and maintain the prescription regimen.  Written material given at graduation. Flowsheet Row Pulmonary Rehab from 12/03/2022 in [facility] Cardiac and Pulmonary Rehab  Education need identified 09/16/22  Date 12/03/22  Educator [facility]  Instruction Review Code 1- Verbalizes Understanding       Other: -Provides group and verbal instruction on various topics (see comments)   Knowledge Questionnaire Score:  Knowledge Questionnaire Score - 09/16/22 1331       Knowledge Questionnaire Score   Pre Score 12/18              Core Components/Risk Factors/Patient Goals at Admission:  Personal Goals and Risk Factors at Admission - 08/26/22 1633       Core Components/Risk Factors/Patient Goals on Admission    Weight Management Yes    Intervention Weight Management: Develop a combined nutrition and exercise program designed to reach desired caloric intake, while maintaining appropriate intake of nutrient and fiber, sodium and fats, and appropriate energy expenditure required for the weight goal.;Weight Management: Provide education and appropriate resources to help participant work on and attain dietary goals.    Admit Weight 109 lb (49.4 kg)    Expected Outcomes Short Term: Continue to assess and modify interventions until short term weight is achieved;Long Term: Adherence to nutrition and physical activity/exercise program aimed toward attainment of established weight goal;Weight Maintenance: Understanding of the daily nutrition guidelines, which includes 25-35% calories from fat, 7% or less cal from saturated fats, less than 200mg  cholesterol, less than 1.5gm of sodium, & 5 or more servings of fruits and vegetables daily;Understanding recommendations for meals to include 15-35% energy as protein,  25-35% energy from fat, 35-60% energy from carbohydrates, less than 200mg  of dietary cholesterol, 20-35 gm of total fiber daily;Understanding of distribution of calorie intake throughout  the day with the consumption of 4-5 meals/snacks    Tobacco Cessation Yes    Number of packs per day 2 cigarettes per day    Intervention Assist the participant in steps to quit. Provide individualized education and counseling about committing to Tobacco Cessation, relapse prevention, and pharmacological support that can be provided by physician.    Improve shortness of breath with ADL's Yes    Intervention Provide education, individualized exercise plan and daily activity instruction to help decrease symptoms of SOB with activities of daily living.    Expected Outcomes Short Term: Improve cardiorespiratory fitness to achieve a reduction of symptoms when performing ADLs             Education:Diabetes - Individual verbal and written instruction to review signs/symptoms of diabetes, desired ranges of glucose level fasting, after meals and with exercise. Acknowledge that pre and post exercise glucose checks will be done for 3 sessions at entry of program.   Know Your Numbers and Heart Failure: - Group verbal and visual instruction to discuss disease risk factors for cardiac and pulmonary disease and treatment options.  Reviews associated critical values for Overweight/Obesity, Hypertension, Cholesterol, and Diabetes.  Discusses basics of heart failure: signs/symptoms and treatments.  Introduces Heart Failure Zone chart for action plan for heart failure.  Written material given at graduation. Flowsheet Row [facility] from 12/03/2022 in [facility]  Date 10/10/22  Educator SB  Instruction Review Code 1- Verbalizes Understanding       Core Components/Risk Factors/Patient Goals Review:   Goals and Risk Factor Review     Row Name 10/24/22 [phone] 12/31/22 0950           Core  Components/Risk Factors/Patient Goals Review   Personal Goals Review Improve shortness of breath with ADL's Improve shortness of breath with ADL's;Tobacco Cessation      Review Spoke to patient about their shortness of breath and what they can do to improve. Patient has been informed of breathing techniques when starting the program. Patient is informed to tell staff if they have had any med changes and that certain meds they are taking or not taking can be causing shortness of breath. Hope states that she is still struggling with tobacco cessation. She reports that she has cut back to smoking one cigarette a day, but will smoke half of it at one time and then come back and smoke the rest later. Hope states that her SOB has been steadily improving since starting the program. She especially notices being less SOB during exercise.      Expected Outcomes Short: Attend [facility] regularly to improve shortness of breath with ADL's. Long: maintain independence with ADL's Short: Continue to work towards quitting smoking. Long: Continue to manage lifestyle risk factors.               Core Components/Risk Factors/Patient Goals at Discharge (Final Review):   Goals and Risk Factor Review - 12/31/22 0950       Core Components/Risk Factors/Patient Goals Review   Personal Goals Review Improve shortness of breath with ADL's;Tobacco Cessation    Review Hope states that she is still struggling with tobacco cessation. She reports that she has cut back to smoking one cigarette a day, but will smoke half of it at one time and then come back and smoke the rest later. Hope states that her SOB has been steadily improving since starting the program. She especially notices being less SOB during exercise.  Expected Outcomes Short: Continue to work towards quitting smoking. Long: Continue to manage lifestyle risk factors.             ITP Comments:  ITP Comments     Row Name 08/26/22 1628 09/16/22 1329 09/19/22  1008 10/09/22 1239 11/06/22 0854   ITP Comments Initial phone call completed. Dx can be found in [facility] 7/16. EP orientation scheduled for 8/22 at 2:30 pm. Completed and gym orientation. Initial ITP created and sent for review to Dr. Fuad Aleskerov, Medical Director. First full day of exercise!  Patient was oriented to gym and equipment including functions, settings, policies, and procedures.  Patient's individual exercise prescription and treatment plan were reviewed.  All starting workloads were established based on the results of the 6 minute walk test done at initial orientation visit.  The plan for exercise progression was also introduced and progression will be customized based on patient's performance and goals. 30 Day review completed. Medical Director ITP review done, changes made as directed, and signed approval by Medical Director.    new to program 30 Day review completed. Medical Director ITP review done, changes made as directed, and signed approval by Medical Director.    Row Name 11/27/22 0932 12/25/22 1147 01/22/23 1259       ITP Comments 30 Day review completed. Medical Director ITP review done, changes made as directed, and signed approval by Medical Director. 30 Day review completed. Medical Director ITP review done, changes made as directed, and signed approval by Medical Director. 30 Day review completed. Medical Director ITP review done, changes made as directed, and signed approval by Medical Director.              Comments: 30 day review

## 2023-01-23 ENCOUNTER — Ambulatory Visit: Payer: 59

## 2023-01-23 ENCOUNTER — Telehealth: Payer: Self-pay | Admitting: *Deleted

## 2023-01-23 NOTE — Telephone Encounter (Addendum)
Tammy Maxwell has broken a bone in her foot and is wearing a boot for at least 2-3 weeks.  She will call when she is able to return.    2/11  called LMOM to check on Tammy Maxwell returning to the program date.

## 2023-01-28 ENCOUNTER — Ambulatory Visit: Payer: 59

## 2023-01-30 ENCOUNTER — Ambulatory Visit: Payer: 59

## 2023-02-04 ENCOUNTER — Ambulatory Visit: Payer: 59

## 2023-02-06 ENCOUNTER — Ambulatory Visit: Payer: 59

## 2023-02-08 ENCOUNTER — Encounter: Payer: Self-pay | Admitting: Internal Medicine

## 2023-02-11 ENCOUNTER — Encounter: Payer: Self-pay | Admitting: *Deleted

## 2023-02-11 ENCOUNTER — Ambulatory Visit: Payer: 59

## 2023-02-13 ENCOUNTER — Ambulatory Visit: Payer: 59

## 2023-02-18 ENCOUNTER — Ambulatory Visit: Payer: 59

## 2023-02-19 DIAGNOSIS — J439 Emphysema, unspecified: Secondary | ICD-10-CM

## 2023-02-19 NOTE — Progress Notes (Signed)
 Pulmonary Individual Treatment Plan  Patient Details  Name: Tammy Maxwell MRN: [id number] Date of Birth: [birth year] Referring Provider:   Flowsheet Row Pulmonary Rehab from 09/16/2022 in [facility] Cardiac and Pulmonary Rehab  Referring Provider Dr. Raechel Chute, MD       Initial Encounter Date:  Flowsheet Row Pulmonary Rehab from 09/16/2022 in [facility] Cardiac and Pulmonary Rehab  Date 09/16/22       Visit Diagnosis: Pulmonary emphysema, unspecified emphysema type (HCC)  Patient's Home Medications on Admission:  Current Outpatient Medications:    albuterol (PROVENTIL) (2.5 MG/3ML) 0.083% nebulizer solution, Take 2.5 mg by nebulization 4 (four) times daily., Disp: , Rfl:    aspirin 81 MG chewable tablet, Chew 81 mg by mouth daily., Disp: , Rfl:    buPROPion (WELLBUTRIN SR) 150 MG 12 hr tablet, Take 150 mg by mouth 2 (two) times daily., Disp: , Rfl:    clonazePAM (KLONOPIN) 1 MG tablet, Take 1 mg by mouth at bedtime., Disp: , Rfl:    cyanocobalamin (VITAMIN B12) 500 MCG tablet, Take 500 mcg by mouth daily., Disp: , Rfl:    ferrous sulfate 324 MG TBEC, Take 324 mg by mouth., Disp: , Rfl:    fluticasone-salmeterol (ADVAIR HFA) 230-21 MCG/ACT inhaler, Inhale 2 puffs into the lungs 2 (two) times daily., Disp: 1 each, Rfl: 12   levothyroxine (SYNTHROID) 50 MCG tablet, Take 50 mcg by mouth daily., Disp: , Rfl:    meloxicam (MOBIC) 15 MG tablet, Take 15 mg by mouth at bedtime., Disp: , Rfl:    naloxone (NARCAN) nasal spray 4 mg/0.1 mL, SMARTSIG:Both Nares, Disp: , Rfl:    nicotine (NICODERM CQ - DOSED IN MG/24 HOURS) 14 mg/24hr patch, Place 14 mg onto the skin daily., Disp: , Rfl:    nicotine polacrilex (NICOTINE MINI) 2 MG lozenge, Take 1 lozenge (2 mg total) by mouth every 2 (two) hours as needed for smoking cessation., Disp: 72 lozenge, Rfl: 3   nitroGLYCERIN (NITROSTAT) 0.3 MG SL tablet, Place 0.3 mg under the tongue every 5 (five) minutes as needed for chest pain., Disp: , Rfl:    omeprazole  (PRILOSEC) 40 MG capsule, Take 40 mg by mouth 2 (two) times daily., Disp: , Rfl:    PARoxetine (PAXIL) 20 MG tablet, Take 20 mg by mouth at bedtime., Disp: , Rfl:    pravastatin (PRAVACHOL) 10 MG tablet, Take 10 mg by mouth daily., Disp: , Rfl:    QUEtiapine (SEROQUEL) 400 MG tablet, Take 400 mg by mouth at bedtime., Disp: , Rfl:    sucralfate (CARAFATE) 1 GM/10ML suspension, Take 2 g by mouth 3 times/day as needed-between meals & bedtime., Disp: , Rfl:    SUMAtriptan (IMITREX) 100 MG tablet, Take 100 mg by mouth every 2 (two) hours as needed for migraine. May repeat in 2 hours if headache persists or recurs., Disp: , Rfl:    Tiotropium Bromide Monohydrate (SPIRIVA RESPIMAT) 2.5 MCG/ACT AERS, Inhale 2 puffs into the lungs daily., Disp: 4 g, Rfl: 11   traMADol (ULTRAM) 50 MG tablet, Take 50 mg by mouth 4 (four) times daily., Disp: , Rfl:  No current facility-administered medications for this visit.  Facility-Administered Medications Ordered in Other Visits:    albuterol (PROVENTIL) (2.5 MG/3ML) 0.083% nebulizer solution 2.5 mg, 2.5 mg, Nebulization, Once, Raechel Chute, MD  Past Medical History: Past Medical History:  Diagnosis Date   Anemia    Arthritis    Bronchitis    Chest pain    COPD (chronic obstructive pulmonary disease) (HCC)  Depression    Dysrhythmia    GERD (gastroesophageal reflux disease)    Headache    Heart murmur    History of hiatal hernia    Pneumonia    Pneumothorax    Wears dentures    full upper    Tobacco Use: Social History   Tobacco Use  Smoking Status Every Day   Current packs/day: 2.00   Average packs/day: 2.0 packs/day for 49.1 years (98.2 ttl pk-yrs)   Types: Cigarettes   Start date: 1976  Smokeless Tobacco Never  Tobacco Comments   2 cigs daily- 07/23/2022    Labs: Review Flowsheet       Latest Ref Rng & Units 02/13/2022  Labs for ITP Cardiac and Pulmonary Rehab  Cholestrol 0 - 200 mg/dL 096   LDL (calc) 0 - 99 mg/dL 97   HDL-C >04  mg/dL 73   Trlycerides <540 mg/dL 87      Pulmonary Assessment Scores:  Pulmonary Assessment Scores     Row Name 09/16/22 1336         ADL UCSD   ADL Phase Entry     SOB Score total 82     Rest 3     Walk 4     Stairs 5     Bath 4     Dress 3     Shop 0       CAT Score   CAT Score 36       mMRC Score   mMRC Score 3              UCSD: Self-administered rating of dyspnea associated with activities of daily living (ADLs) 6-point scale (0 = "not at all" to 5 = "maximal or unable to do because of breathlessness")  Scoring Scores range from 0 to 120.  Minimally important difference is 5 units  CAT: CAT can identify the health impairment of COPD patients and is better correlated with disease progression.  CAT has a scoring range of zero to 40. The CAT score is classified into four groups of low (less than 10), medium (10 - 20), high (21-30) and very high (31-40) based on the impact level of disease on health status. A CAT score over 10 suggests significant symptoms.  A worsening CAT score could be explained by an exacerbation, poor medication adherence, poor inhaler technique, or progression of COPD or comorbid conditions.  CAT MCID is 2 points  mMRC: mMRC (Modified Medical Research Council) Dyspnea Scale is used to assess the degree of baseline functional disability in patients of respiratory disease due to dyspnea. No minimal important difference is established. A decrease in score of 1 point or greater is considered a positive change.   Pulmonary Function Assessment:   Exercise Target Goals: Exercise Program Goal: Individual exercise prescription set using results from initial 6 min walk test and THRR while considering  patient's activity barriers and safety.   Exercise Prescription Goal: Initial exercise prescription builds to 30-45 minutes a day of aerobic activity, 2-3 days per week.  Home exercise guidelines will be given to patient during program as part of  exercise prescription that the participant will acknowledge.  Education: Aerobic Exercise: - Group verbal and visual presentation on the components of exercise prescription. Introduces F.I.T.T principle from ACSM for exercise prescriptions.  Reviews F.I.T.T. principles of aerobic exercise including progression. Written material given at graduation. Flowsheet Row Pulmonary Rehab from 12/03/2022 in [facility] Cardiac and Pulmonary Rehab  Education need identified 09/16/22  Date  11/07/22  Educator MB  Instruction Review Code 1- Verbalizes Understanding       Education: Resistance Exercise: - Group verbal and visual presentation on the components of exercise prescription. Introduces F.I.T.T principle from ACSM for exercise prescriptions  Reviews F.I.T.T. principles of resistance exercise including progression. Written material given at graduation.    Education: Exercise & Equipment Safety: - Individual verbal instruction and demonstration of equipment use and safety with use of the equipment. Flowsheet Row Pulmonary Rehab from 12/03/2022 in [facility] Cardiac and Pulmonary Rehab  Date 09/16/22  Educator NT  Instruction Review Code 1- Verbalizes Understanding       Education: Exercise Physiology & General Exercise Guidelines: - Group verbal and written instruction with models to review the exercise physiology of the cardiovascular system and associated critical values. Provides general exercise guidelines with specific guidelines to those with heart or lung disease.  Flowsheet Row Pulmonary Rehab from 12/03/2022 in [facility] Cardiac and Pulmonary Rehab  Date 10/24/22  Educator MB  Instruction Review Code 1- [employer] Understanding       Education: Flexibility, Balance, Mind/Body Relaxation: - Group verbal and visual presentation with interactive activity on the components of exercise prescription. Introduces F.I.T.T principle from ACSM for exercise prescriptions. Reviews F.I.T.T. principles of  flexibility and balance exercise training including progression. Also discusses the mind body connection.  Reviews various relaxation techniques to help reduce and manage stress (i.e. Deep breathing, progressive muscle relaxation, and visualization). Balance handout provided to take home. Written material given at graduation.   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 HR 107 bpm     Max Ex. BP 124/72     2 Minute Post BP 112/68       Interval HR   1 Minute HR 101     2 Minute HR 105     3 Minute HR 106     4 Minute HR 106     5 Minute HR 107     6 Minute HR 106     2 Minute Post HR 98     Interval Heart Rate? Yes       Interval Oxygen   Interval Oxygen? Yes     Baseline Oxygen Saturation % 99 %     1 Minute Oxygen Saturation % 99 %     1 Minute Liters of Oxygen  3 L     2 Minute Oxygen Saturation % 97 %     2 Minute Liters of Oxygen 3 L     3 Minute Oxygen Saturation % 97 %     3 Minute Liters of Oxygen 3 L     4 Minute Oxygen Saturation % 98 %     4 Minute Liters of Oxygen 3 L     5 Minute Oxygen Saturation % 97 %     5 Minute Liters of Oxygen 3 L     6 Minute Oxygen Saturation % 94 %     6 Minute Liters of Oxygen 3 L     2 Minute Post Oxygen  Saturation % 95 %     2 Minute Post Liters of Oxygen 3 L             Oxygen Initial Assessment:  Oxygen Initial Assessment - 08/26/22 1552       Home Oxygen   Home Oxygen Device Home Concentrator;E-Tanks    Sleep Oxygen Prescription Continuous    Liters per minute 2.5    Home Exercise Oxygen Prescription Pulsed    Liters per minute 2.5    Home Resting Oxygen Prescription Pulsed    Liters per minute 2.5    Compliance with Home Oxygen Use Yes      Intervention   Short Term Goals To learn and exhibit compliance with exercise, home and travel O2 prescription;To learn and understand importance of maintaining oxygen saturations>88%;To learn and demonstrate proper use of respiratory medications;To learn and understand importance of monitoring SPO2 with pulse oximeter and demonstrate accurate use of the pulse oximeter.;To learn and demonstrate proper pursed lip breathing techniques or other breathing techniques.     Long  Term Goals Exhibits compliance with exercise, home  and travel O2 prescription;Maintenance of O2 saturations>88%;Compliance with respiratory medication;Verbalizes importance of monitoring SPO2 with pulse oximeter and return demonstration;Exhibits proper breathing techniques, such as pursed lip breathing or other method taught during program session             Oxygen Re-Evaluation:  Oxygen Re-Evaluation     Row Name 09/19/22 1009 10/24/22 0940 12/31/22 0954         Program Oxygen Prescription   Program Oxygen Prescription -- Continuous;E-Tanks Continuous;E-Tanks     Liters per minute -- 2 2       Home Oxygen   Home Oxygen Device -- Home Concentrator;E-Tanks Home Concentrator;E-Tanks     Sleep Oxygen Prescription -- Continuous Continuous     Liters per minute -- 2.5 2.5     Home Exercise Oxygen Prescription -- Continuous Continuous     Liters per minute -- 2.5 2.5     Home Resting Oxygen Prescription -- Continuous Continuous     Liters per minute -- 2.5 --      Compliance with Home Oxygen Use -- Yes Yes       Goals/Expected Outcomes   Short Term Goals -- To learn and demonstrate proper pursed lip breathing techniques or other breathing techniques.  To learn and demonstrate proper pursed lip breathing techniques or other breathing techniques.      Long  Term Goals -- Exhibits proper breathing techniques, such as pursed lip breathing or other method taught during program session Exhibits proper breathing techniques, such as pursed lip breathing or other method taught during program session     Comments Reviewed PLB technique with pt.  Talked about how it works and it's importance in maintaining  their exercise saturations. Informed patient how to perform the Pursed Lipped breathing technique. Told patient to Inhale through the nose and out the mouth with pursed lips to keep their airways open, help oxygenate them better, practice when at rest or doing strenuous activity. Patient Verbalizes understanding of technique and will work on and be reiterated during [facility]. Hope states that she continues to practice PLB. She does believe that her breathing has improved with exercise since practining PLB. She continues to be consistent and compliant with all of her pulmonary medications.     Goals/Expected Outcomes Short: Become more profiecient at using PLB.   Long: Become independent at using PLB. Short: use PLB with exertion. Long: use PLB on exertion proficiently and independently. Short: use PLB with exertion. Long: use PLB on exertion proficiently and independently.              Oxygen Discharge (Final Oxygen Re-Evaluation):  Oxygen Re-Evaluation - 12/31/22 0954       Program Oxygen Prescription   Program Oxygen Prescription Continuous;E-Tanks    Liters per minute 2      Home Oxygen   Home Oxygen Device Home Concentrator;E-Tanks    Sleep Oxygen Prescription Continuous    Liters per minute 2.5    Home Exercise Oxygen Prescription Continuous     Liters per minute 2.5    Home Resting Oxygen Prescription Continuous    Compliance with Home Oxygen Use Yes      Goals/Expected Outcomes   Short Term Goals To learn and demonstrate proper pursed lip breathing techniques or other breathing techniques.     Long  Term Goals Exhibits proper breathing techniques, such as pursed lip breathing or other method taught during program session    Comments Hope states that she continues to practice PLB. She does believe that her breathing has improved with exercise since practining PLB. She continues to be consistent and compliant with all of her pulmonary medications.    Goals/Expected Outcomes Short: use PLB with exertion. Long: use PLB on exertion proficiently and independently.             Initial Exercise Prescription:  Initial Exercise Prescription - 09/16/22 1300       Date of Initial Exercise RX and Referring Provider   Date 09/16/22    Referring Provider Dr. Raechel Chute, MD      Oxygen   Oxygen Continuous    Liters 3    Maintain Oxygen Saturation 88% or higher      Treadmill   MPH 1.8    Grade 0.5    Minutes 15    METs 2.5      Recumbant Bike   Level 2    RPM 50    Watts 25    Minutes 15    METs 3.66      NuStep   Level 2    SPM 80    Minutes 15    METs 3.66      Prescription Details   Frequency (times per week) 2    Duration Progress to 30 minutes of continuous aerobic without signs/symptoms of physical distress      Intensity   THRR 40-80% of Max Heartrate 109-143    Ratings of Perceived Exertion 11-13    Perceived Dyspnea 0-4      Progression   Progression Continue to progress workloads to maintain intensity without signs/symptoms of physical distress.      Resistance Training   Training Prescription Yes    Weight  3 lb    Reps 10-15             Perform Capillary Blood Glucose checks as needed.  Exercise Prescription Changes:   Exercise Prescription Changes     Row Name 09/16/22 1300  09/24/22 1400 10/11/22 0900 10/25/22 0900 11/07/22 1500     Response to Exercise   Blood Pressure (Admit) 116/70 100/62 100/60 120/72 96/60   Blood Pressure (Exercise) 124/72 108/62 134/72 126/66 108/62   Blood Pressure (Exit) 112/68 98/62 102/60 102/60 102/64   Heart Rate (Admit) 75 bpm 75 bpm 87 bpm 69 bpm 80 bpm   Heart Rate (Exercise) 107 bpm 92 bpm 96 bpm 91 bpm 100 bpm   Heart Rate (Exit) 98 bpm 80 bpm 80 bpm 69 bpm 84 bpm   Oxygen Saturation (Admit) 99 % 97 % 97 % 97 % 96 %   Oxygen Saturation (Exercise) 94 % 97 % 90 % 85 % 84 %   Oxygen Saturation (Exit) 95 % 98 % 96 % 98 % 92 %   Rating of Perceived Exertion (Exercise) 9 9 13 13 14    Perceived Dyspnea (Exercise) 1 1 1 1 2    Symptoms Fatigue none none none none   Comments Results -- -- -- --   Duration -- Progress to 30 minutes of  aerobic without signs/symptoms of physical distress Progress to 30 minutes of  aerobic without signs/symptoms of physical distress Progress to 30 minutes of  aerobic without signs/symptoms of physical distress Progress to 30 minutes of  aerobic without signs/symptoms of physical distress   Intensity -- THRR unchanged THRR unchanged THRR unchanged THRR unchanged     Progression   Progression -- Continue to progress workloads to maintain intensity without signs/symptoms of physical distress. Continue to progress workloads to maintain intensity without signs/symptoms of physical distress. Continue to progress workloads to maintain intensity without signs/symptoms of physical distress. Continue to progress workloads to maintain intensity without signs/symptoms of physical distress.   Average METs -- 2.5 2.31 2.27 2.03     Resistance Training   Training Prescription -- Yes Yes Yes Yes   Weight -- 3 lb 3 lb 3 lb 3 lb   Reps -- 10-15 10-15 10-15 10-15     Interval Training   Interval Training -- No No No No     Oxygen   Oxygen -- Continuous Continuous Continuous Continuous   Liters -- 3 3 3 2       Treadmill   MPH -- -- -- 1.2 1.4   Grade -- -- -- 0 0   Minutes -- -- -- 15 15   METs -- -- -- 1.92 2.07     Recumbant Bike   Level -- 1 1 2  --   Watts -- 12 12 12  --   Minutes -- 15 15 15  --   METs -- 2.5 2.77 2.78 --     NuStep   Level -- 1 2 3 3    Minutes -- 15 15 15 15    METs -- 2.5 1.4 2.1 2.1     Biostep-RELP   Level -- -- -- 1 1   Minutes -- -- -- 15 15   METs -- -- -- -- 2     Oxygen   Maintain Oxygen Saturation -- 88% or higher 88% or higher 88% or higher 88% or higher    Row Name 12/04/22 1000 12/12/22 1000 12/19/22 1600 01/02/23 1500 01/17/23 0900     Response to Exercise  Blood Pressure (Admit) 122/68 -- 110/70 100/60 98/56   Blood Pressure (Exit) -- -- 102/54 122/66 102/60   Heart Rate (Admit) 84 bpm -- 83 bpm 74 bpm 74 bpm   Heart Rate (Exercise) 83 bpm -- 104 bpm 92 bpm 112 bpm   Heart Rate (Exit) 76 bpm -- 81 bpm 94 bpm 87 bpm   Oxygen Saturation (Admit) 97 % -- 92 % 95 % 98 %   Oxygen Saturation (Exercise) 89 % -- 91 % 90 % 95 %   Oxygen Saturation (Exit) 96 % -- 96 % 94 % 98 %   Rating of Perceived Exertion (Exercise) 13 -- 13 13 13    Perceived Dyspnea (Exercise) 1 -- 1 1 1    Symptoms none -- none none none   Duration Progress to 30 minutes of  aerobic without signs/symptoms of physical distress -- Progress to 30 minutes of  aerobic without signs/symptoms of physical distress Progress to 30 minutes of  aerobic without signs/symptoms of physical distress Progress to 30 minutes of  aerobic without signs/symptoms of physical distress   Intensity THRR unchanged -- THRR unchanged THRR unchanged THRR unchanged     Progression   Progression Continue to progress workloads to maintain intensity without signs/symptoms of physical distress. -- Continue to progress workloads to maintain intensity without signs/symptoms of physical distress. Continue to progress workloads to maintain intensity without signs/symptoms of physical distress. Continue to progress workloads  to maintain intensity without signs/symptoms of physical distress.   Average METs 1.85 -- 2.25 2.19 2.13     Resistance Training   Training Prescription Yes -- Yes Yes Yes   Weight 3 lb -- 3 lb 3 lb 3 lb   Reps 10-15 -- 10-15 10-15 10-15     Interval Training   Interval Training No -- No No No     Oxygen   Oxygen Continuous -- Continuous Continuous Continuous   Liters 2 -- 2 2 2      Treadmill   MPH -- -- 1.8 1.8 1.5   Grade -- -- 0 0 0   Minutes -- -- 15 15 15    METs -- -- 2.38 2.38 2.15     NuStep   Level 2 -- 5 3 3    Minutes 15 -- 30 15 15    METs 2.1 -- 2.7 2 2.1     Home Exercise Plan   Plans to continue exercise at -- Home (comment)  Walking outside and using 3 lb hand weights Home (comment)  Walking outside and using 3 lb hand weights Home (comment)  Walking outside and using 3 lb hand weights Home (comment)  Walking outside and using 3 lb hand weights   Frequency -- Add 3 additional days to program exercise sessions. Add 3 additional days to program exercise sessions. Add 3 additional days to program exercise sessions. Add 3 additional days to program exercise sessions.   Initial Home Exercises Provided -- 12/12/22 12/12/22 12/12/22 12/12/22     Oxygen   Maintain Oxygen Saturation 88% or higher 88% or higher 88% or higher 88% or higher 88% or higher            Exercise Comments:   Exercise Comments     Row Name 09/19/22 1008           Exercise Comments First full day of exercise!  Patient was oriented to gym and equipment including functions, settings, policies, and procedures.  Patient's individual exercise prescription and treatment plan were reviewed.  All starting  workloads were established based on the results of the 6 minute walk test done at initial orientation visit.  The plan for exercise progression was also introduced and progression will be customized based on patient's performance and goals.                Exercise Goals and Review:    Exercise Goals     Row Name 09/16/22 1342             Exercise Goals   Increase Physical Activity Yes       Intervention Provide advice, education, support and counseling about physical activity/exercise needs.;Develop an individualized exercise prescription for aerobic and resistive training based on initial evaluation findings, risk stratification, comorbidities and participant's personal goals.       Expected Outcomes Short Term: Attend rehab on a regular basis to increase amount of physical activity.;Long Term: Add in home exercise to make exercise part of routine and to increase amount of physical activity.;Long Term: Exercising regularly at least 3-5 days a week.       Increase Strength and Stamina Yes       Intervention Provide advice, education, support and counseling about physical activity/exercise needs.;Develop an individualized exercise prescription for aerobic and resistive training based on initial evaluation findings, risk stratification, comorbidities and participant's personal goals.       Expected Outcomes Short Term: Increase workloads from initial exercise prescription for resistance, speed, and METs.;Short Term: Perform resistance training exercises routinely during rehab and add in resistance training at home;Long Term: Improve cardiorespiratory fitness, muscular endurance and strength as measured by increased METs and functional capacity ( )       Able to understand and use rate of perceived exertion (RPE) scale Yes       Intervention Provide education and explanation on how to use RPE scale       Expected Outcomes Short Term: Able to use RPE daily in rehab to express subjective intensity level;Long Term:  Able to use RPE to guide intensity level when exercising independently       Able to understand and use Dyspnea scale Yes       Intervention Provide education and explanation on how to use Dyspnea scale       Expected Outcomes Short Term: Able to use Dyspnea scale  daily in rehab to express subjective sense of shortness of breath during exertion;Long Term: Able to use Dyspnea scale to guide intensity level when exercising independently       Knowledge and understanding of Target Heart Rate Range (THRR) Yes       Intervention Provide education and explanation of THRR including how the numbers were predicted and where they are located for reference       Expected Outcomes Short Term: Able to state/look up THRR;Long Term: Able to use THRR to govern intensity when exercising independently;Short Term: Able to use daily as guideline for intensity in rehab       Able to check pulse independently Yes       Intervention Provide education and demonstration on how to check pulse in carotid and radial arteries.;Review the importance of being able to check your own pulse for safety during independent exercise       Expected Outcomes Short Term: Able to explain why pulse checking is important during independent exercise;Long Term: Able to check pulse independently and accurately       Understanding of Exercise Prescription Yes       Intervention Provide education, explanation, and  written materials on patient's individual exercise prescription       Expected Outcomes Short Term: Able to explain program exercise prescription;Long Term: Able to explain home exercise prescription to exercise independently                Exercise Goals Re-Evaluation :  Exercise Goals Re-Evaluation     Row Name 09/19/22 1008 09/24/22 1437 10/11/22 0934 10/25/22 0943 11/07/22 1545     Exercise Goal Re-Evaluation   Exercise Goals Review Increase Physical Activity;Able to understand and use rate of perceived exertion (RPE) scale;Knowledge and understanding of Target Heart Rate Range (THRR);Understanding of Exercise Prescription;Increase Strength and Stamina;Able to check pulse independently;Able to understand and use Dyspnea scale Increase Physical Activity;Increase Strength and  Stamina;Understanding of Exercise Prescription Increase Physical Activity;Increase Strength and Stamina;Understanding of Exercise Prescription Increase Physical Activity;Increase Strength and Stamina;Understanding of Exercise Prescription Increase Physical Activity;Increase Strength and Stamina;Understanding of Exercise Prescription   Comments Reviewed RPE  and dyspnea scale, THR and program prescription with pt today.  Pt voiced understanding and was given a copy of goals to take home. Hope is off to a good start in the program. She attended her first and only appointment during this review. During the session she used the T4 nustep and recumbent bike at level 1. We will continue to monitor her progress in the program. Hope is doing well in the program. She continues to do well at level 1 on the recumbent bike and improved to level 2 on the T4 nustep. She also continues to do well with 3 lb hand weights for resistance training. We will continue to monitor her progress in the program. Hope continues to do well in the program. She recently improved to level 2 on the recumbent bike and level 3 on the T4 nustep. She also began using the treadmill at a speed of 1.2 mph with no incline. We will continue to monitor her progress in the program. Hope is doing well in rehab. She increased her treadmill workload to a speed of 1.4 mph with no incline. She also has stayed consistent on the biostep at level 1 and T4 nustep at level 3. We will continue to monitor her progress in the program.   Expected Outcomes Short: Use RPE daily to regulate intensity.  Long: Follow program prescription in THR. Short: Continue to follow current exercise prescription. Long: Continue exercise to improve strength and stamina. Short: Try level 2 on the recumbent bike. Long: Continue exercise to improve strength and stamina. Short: Continue to walk on treadmill. Long: Continue exercise to improve strength and stamina. Short: Increase to level 2 on  the biostep. Long: Continue exercise to improve strength and stamina.    Row Name 12/04/22 1052 12/12/22 1013 12/19/22 1607 12/31/22 0937 01/02/23 1508     Exercise Goal Re-Evaluation   Exercise Goals Review Increase Physical Activity;Increase Strength and Stamina;Understanding of Exercise Prescription -- Increase Physical Activity;Increase Strength and Stamina;Understanding of Exercise Prescription Increase Physical Activity;Increase Strength and Stamina;Understanding of Exercise Prescription Increase Physical Activity;Increase Strength and Stamina;Understanding of Exercise Prescription   Comments Hope continues to do well in rehab. She was only able to attend one session during this review. During the one session she only did the T4 nustep at level 2. We will continue to monitor her progress in the program. Reviewed home exercise with pt today from 09:39 to 09:50.  Pt plans to walk outside at home under the right weather conditions and use 3 lb hand weights for resistance  training as well.  Reviewed THR, pulse, RPE, sign and symptoms, pulse oximetery and when to call 911 or MD.  Also discussed weather considerations and indoor options.  Pt voiced understanding. Hope continues to do well in the program. She continues to walk on the treadmill at a speed of 1.8 mph with no incline. She also improved to level 5 on the T4 nustep and was able to work for 30 minutes on this machine. We will continue to monitor her progress in the program. Hope has been doing well with exercise. She states that she has not been walking at home on days away from rehab due to the cold weather. However, she does state that she has space at home inside where she can walk and she can walk her stairs as well. She also states that she has noticed some benefits from exercising in rehab, specifically that she has more energy and more stamina. Her pulmonologist also noticed some progress, and has encouraged her to continue with her exercise  as well. We will continue to monitor her progress. Hope was only able to attend one rehab session during this review period. During this one session she was able to maintain her workload on the treadmill at 1.77mph. She also used the T4 nustep but did decrease her level to 3. We will continue to monitor her progress in the program.   Expected Outcomes Short; attend rehab more regularyly. Long: Continue exercise to improve strength and stamina. Short: Begin walking at home on days away from rehab. Long: Continue exercise to improve strength and stamina. Short: Begin to progressively increase treadmill workload. Long: Continue exercise to improve strength and stamina. Short: Begin walking at home on days away from rehab. Long: Continue exercise to improve strength and stamina. Short: Increase T4 nustep level to 5. Long: continue exercise to increase strength and stamina.    Row Name 01/17/23 0932 01/27/23 1612           Exercise Goal Re-Evaluation   Exercise Goals Review Increase Physical Activity;Increase Strength and Stamina;Understanding of Exercise Prescription Increase Physical Activity;Increase Strength and Stamina;Understanding of Exercise Prescription      Comments Hope continues to do well in rehab. She is doing well on the treadmill at a speed of 1.5 mph with no incline. She also continues to do well at level 3 on the T4 nustep. We will continue to monitor her progress in the program. Hope has not attended rehab since the last review. She did call to let staff know that she has broken a bone in her foot and will need to be out of rehab for two to three weeks. We will continue to monitor her progress when she returns to the program.      Expected Outcomes Short: Increase T4 nustep level to 4. Long: continue exercise to increase strength and stamina. Short: Return to rehab when appropriate. Long: Graduate from the program.               Discharge Exercise Prescription (Final Exercise  Prescription Changes):  Exercise Prescription Changes - 01/17/23 0900       Response to Exercise   Blood Pressure (Admit) 98/56    Blood Pressure (Exit) 102/60    Heart Rate (Admit) 74 bpm    Heart Rate (Exercise) 112 bpm    Heart Rate (Exit) 87 bpm    Oxygen Saturation (Admit) 98 %    Oxygen Saturation (Exercise) 95 %    Oxygen Saturation (Exit) 98 %  Rating of Perceived Exertion (Exercise) 13    Perceived Dyspnea (Exercise) 1    Symptoms none    Duration Progress to 30 minutes of  aerobic without signs/symptoms of physical distress    Intensity THRR unchanged      Progression   Progression Continue to progress workloads to maintain intensity without signs/symptoms of physical distress.    Average METs 2.13      Resistance Training   Training Prescription Yes    Weight 3 lb    Reps 10-15      Interval Training   Interval Training No      Oxygen   Oxygen Continuous    Liters 2      Treadmill   MPH 1.5    Grade 0    Minutes 15    METs 2.15      NuStep   Level 3    Minutes 15    METs 2.1      Home Exercise Plan   Plans to continue exercise at Home (comment)   Walking outside and using 3 lb hand weights   Frequency Add 3 additional days to program exercise sessions.    Initial Home Exercises Provided 12/12/22      Oxygen   Maintain Oxygen Saturation 88% or higher             Nutrition:  Target Goals: Understanding of nutrition guidelines, daily intake of sodium 1500mg , cholesterol 200mg , calories 30% from fat and 7% or less from saturated fats, daily to have 5 or more servings of fruits and vegetables.  Education: All About Nutrition: -Group instruction provided by verbal, written material, interactive activities, discussions, models, and posters to present general guidelines for heart healthy nutrition including fat, fiber, MyPlate, the role of sodium in heart healthy nutrition, utilization of the nutrition label, and utilization of this knowledge for  meal planning. Follow up email sent as well. Written material given at graduation.   Biometrics:  Pre Biometrics - 09/16/22 1343       Pre Biometrics   Height 5\' 8"  (1.727 m)    Weight 106 lb 1.6 oz (48.1 kg)    Waist Circumference 26.5 inches    Hip Circumference 33 inches    Waist to Hip Ratio 0.8 %    BMI (Calculated) 16.14    Single Leg Stand 4.8 seconds              Nutrition Therapy Plan and Nutrition Goals:  Nutrition Therapy & Goals - 09/16/22 1332       Nutrition Therapy   RD appointment deferred Yes      Intervention Plan   Intervention Prescribe, educate and counsel regarding individualized specific dietary modifications aiming towards targeted core components such as weight, hypertension, lipid management, diabetes, heart failure and other comorbidities.    Expected Outcomes Short Term Goal: Understand basic principles of dietary content, such as calories, fat, sodium, cholesterol and nutrients.;Short Term Goal: A plan has been developed with personal nutrition goals set during dietitian appointment.;Long Term Goal: Adherence to prescribed nutrition plan.             Nutrition Assessments:  MEDIFICTS Score Key: >=70 Need to make dietary changes  40-70 Heart Healthy Diet <= 40 Therapeutic Level Cholesterol Diet  Flowsheet Row Pulmonary Rehab from 09/16/2022 in [facility] Cardiac and Pulmonary Rehab  Picture Your Plate Total Score on Admission 52      Picture Your Plate Scores: <09 Unhealthy dietary pattern with much room for  improvement. 41-50 Dietary pattern unlikely to meet recommendations for good health and room for improvement. 51-60 More healthful dietary pattern, with some room for improvement.  >60 Healthy dietary pattern, although there may be some specific behaviors that could be improved.   Nutrition Goals Re-Evaluation:  Nutrition Goals Re-Evaluation     Row Name 10/24/22 0941 12/31/22 0948           Goals   Current Weight 111 lb (50.3  kg) 112 lb 4.8 oz (50.9 kg)      Comment Patient was informed on why it is important to maintain a balanced diet when dealing with Respiratory issues. Explained that it takes a lot of energy to breath and when they are short of breath often they will need to have a good diet to help keep up with the calories they are expending for breathing. Hope has not met with the RD but would like to. We will schedule her an appt to meet with the RD. She states that she has a poor appetite and does not want to eat most of the time.      Expected Outcome Short: Choose and plan snacks accordingly to patients caloric intake to improve breathing. Long: Maintain a diet independently that meets their caloric intake to aid in daily shortness of breath. Short: Meet with RD. Long: Continue to maintain a diet that meets caloric intake to aid in daily SOB.               Nutrition Goals Discharge (Final Nutrition Goals Re-Evaluation):  Nutrition Goals Re-Evaluation - 12/31/22 0948       Goals   Current Weight 112 lb 4.8 oz (50.9 kg)    Comment Hope has not met with the RD but would like to. We will schedule her an appt to meet with the RD. She states that she has a poor appetite and does not want to eat most of the time.    Expected Outcome Short: Meet with RD. Long: Continue to maintain a diet that meets caloric intake to aid in daily SOB.             Psychosocial: Target Goals: Acknowledge presence or absence of significant depression and/or stress, maximize coping skills, provide positive support system. Participant is able to verbalize types and ability to use techniques and skills needed for reducing stress and depression.   Education: Stress, Anxiety, and Depression - Group verbal and visual presentation to define topics covered.  Reviews how body is impacted by stress, anxiety, and depression.  Also discusses healthy ways to reduce stress and to treat/manage anxiety and depression.  Written material given  at graduation.   Education: Sleep Hygiene -Provides group verbal and written instruction about how sleep can affect your health.  Define sleep hygiene, discuss sleep cycles and impact of sleep habits. Review good sleep hygiene tips.    Initial Review & Psychosocial Screening:  Initial Psych Review & Screening - 08/26/22 1631       Initial Review   Current issues with Current Psychotropic Meds      Family Dynamics   Good Support System? Yes   Lives alone but her son lives close by and is great support!     Screening Interventions   Interventions Encouraged to exercise    Expected Outcomes Short Term goal: Utilizing psychosocial counselor, staff and physician to assist with identification of specific Stressors or current issues interfering with healing process. Setting desired goal for each stressor or current issue  identified.;Long Term Goal: Stressors or current issues are controlled or eliminated.;Short Term goal: Identification and review with participant of any Quality of Life or Depression concerns found by scoring the questionnaire.;Long Term goal: The participant improves quality of Life and PHQ9 Scores as seen by post scores and/or verbalization of changes             Quality of Life Scores:  Scores of 19 and below usually indicate a poorer quality of life in these areas.  A difference of  2-3 points is a clinically meaningful difference.  A difference of 2-3 points in the total score of the Quality of Life Index has been associated with significant improvement in overall quality of life, self-image, physical symptoms, and general health in studies assessing change in quality of life.  PHQ-9: Review Flowsheet       12/31/2022 10/24/2022 09/16/2022 03/18/2022  Depression screen PHQ 2/9  Decreased Interest 2 2 2 1   Down, Depressed, Hopeless 3 1 2 1   PHQ - 2 Score 5 3 4 2   Altered sleeping 3 1 1  -  Tired, decreased energy 2 2 2  -  Change in appetite 1 0 1 -  Feeling bad or  failure about yourself  2 2 2  -  Trouble concentrating 2 1 1  -  Moving slowly or fidgety/restless 0 1 1 -  Suicidal thoughts 0 0 0 -  PHQ-9 Score 15 10 12  -  Difficult doing work/chores Very difficult Somewhat difficult Somewhat difficult -   Interpretation of Total Score  Total Score Depression Severity:  1-4 = Minimal depression, 5-9 = Mild depression, 10-14 = Moderate depression, 15-19 = Moderately severe depression, 20-27 = Severe depression   Psychosocial Evaluation and Intervention:  Psychosocial Evaluation - 08/26/22 1635       Psychosocial Evaluation & Interventions   Interventions Encouraged to exercise with the program and follow exercise prescription    Comments Gailene "Hope" is coming to pulmonary rehab post emphysema.  She has decreased her tobacco use to now 2 cigarettes per day. She is using nicotene patch and already has cessation material and declines further information at this time. Hope is looking to improve her breathing as she has been having progressive dyspnea, reduce shortness of breath with ADL's and improve muscle strength from rehab. She lives alone and has a son that lives close by that is great support. Hope currently uses home oxygen @ 2.5L/min pulsed. She does have some balance concerns and uses a cane with ambulation. Advised she bring cane with her to rehab. She has had one fall in the past year, with only injury to her toe. 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 She has no barriers to attending the program and is looking forward to starting rehab.    Expected Outcomes Short: Attend pulmonary rehab for education and exercise.  Long: Develop and maintain positive self care habits.    Continue Psychosocial Services  Follow up required by staff             Psychosocial Re-Evaluation:  Psychosocial Re-Evaluation     Row Name 10/24/22 0945 12/31/22 0941           Psychosocial Re-Evaluation    Current issues with Current Stress Concerns;History of Depression;Current Psychotropic Meds Current Stress Concerns;Current Depression;Current Sleep Concerns;Current Psychotropic Meds      Comments Reviewed patient health questionnaire (PHQ-9) with patient for follow up. Previously, patients score indicated signs/symptoms of depression.  Reviewed to see if patient is improving symptom wise while in program.  Score improved and patient states that it is because she is able to come to exercise class. Hope states that she is depressed as she is frustrated with herself for not making lifestyle changes sooner to prevent current health concerns. And because of this she has stress with moving forward with her condition. She does report that her son has been a good support for her and helps take good care of her. She does report that exercise in rehab has been a good stress reliever for her. She states that she is struggling with her sleep as well, as she has trouble falling asleep and staying asleep. We will continue to encourage her to maintain a positive outlook.      Expected Outcomes Short: Continue to attend [facility] regularly for regular exercise and social engagement. Long: Continue to improve symptoms and manage a positive mental state. Short: Continue to attend [facility] regularly for regular exercise and social engagement. Long: Continue to maintain positive mental state.      Interventions Encouraged to attend Pulmonary Rehabilitation for the exercise Encouraged to attend Pulmonary Rehabilitation for the exercise      Continue Psychosocial Services  Follow up required by staff Follow up required by staff               Psychosocial Discharge (Final Psychosocial Re-Evaluation):  Psychosocial Re-Evaluation - 12/31/22 0941       Psychosocial Re-Evaluation   Current issues with Current Stress Concerns;Current Depression;Current Sleep Concerns;Current Psychotropic Meds    Comments Hope states that  she is depressed as she is frustrated with herself for not making lifestyle changes sooner to prevent current health concerns. And because of this she has stress with moving forward with her condition. She does report that her son has been a good support for her and helps take good care of her. She does report that exercise in rehab has been a good stress reliever for her. She states that she is struggling with her sleep as well, as she has trouble falling asleep and staying asleep. We will continue to encourage her to maintain a positive outlook.    Expected Outcomes Short: Continue to attend [facility] regularly for regular exercise and social engagement. Long: Continue to maintain positive mental state.    Interventions Encouraged to attend Pulmonary Rehabilitation for the exercise    Continue Psychosocial Services  Follow up required by staff             Education: Education Goals: Education classes will be provided on a weekly basis, covering required topics. Participant will state understanding/return demonstration of topics presented.  Learning Barriers/Preferences:   General Pulmonary Education Topics:  Infection Prevention: - Provides verbal and written material to individual with discussion of infection control including proper hand washing and proper equipment cleaning during exercise session. Flowsheet Row Pulmonary Rehab from 12/03/2022 in [facility] Cardiac and Pulmonary Rehab  Date 09/16/22  Educator NT  Instruction Review Code 1- Verbalizes Understanding       Falls Prevention: - Provides verbal and written material to individual with discussion of falls prevention and safety. Flowsheet Row Pulmonary Rehab from 12/03/2022 in [facility] Cardiac and Pulmonary Rehab  Date 09/16/22  Educator NT  Instruction Review Code 1- Verbalizes Understanding       Chronic Lung Disease Review: - Group verbal instruction with posters, models, PowerPoint presentations and videos,  to review  new updates, new respiratory medications, new advancements in  procedures and treatments. Providing information on websites and "800" numbers for continued self-education. Includes information about supplement oxygen, available portable oxygen systems, continuous and intermittent flow rates, oxygen safety, concentrators, and Medicare reimbursement for oxygen. Explanation of Pulmonary Drugs, including class, frequency, complications, importance of spacers, rinsing mouth after steroid MDI's, and proper cleaning methods for nebulizers. Review of basic lung anatomy and physiology related to function, structure, and complications of lung disease. Review of risk factors. Discussion about methods for diagnosing sleep apnea and types of masks and machines for OSA. Includes a review of the use of types of environmental controls: home humidity, furnaces, filters, dust mite/pet prevention, HEPA vacuums. Discussion about weather changes, air quality and the benefits of nasal washing. Instruction on Warning signs, infection symptoms, calling MD promptly, preventive modes, and value of vaccinations. Review of effective airway clearance, coughing and/or vibration techniques. Emphasizing that all should Create an Action Plan. Written material given at graduation. Flowsheet Row Pulmonary Rehab from 12/03/2022 in [facility] Cardiac and Pulmonary Rehab  Education need identified 09/16/22  Date 10/03/22  Educator [facility]  Instruction Review Code 1- Verbalizes Understanding       AED/CPR: - Group verbal and written instruction with the use of models to demonstrate the basic use of the AED with the basic ABC's of resuscitation.    Anatomy and Cardiac Procedures: - Group verbal and visual presentation and models provide information about basic cardiac anatomy and function. Reviews the testing methods done to diagnose heart disease and the outcomes of the test results. Describes the treatment choices: Medical Management, Angioplasty,  or Coronary Bypass Surgery for treating various heart conditions including Myocardial Infarction, Angina, Valve Disease, and Cardiac Arrhythmias.  Written material given at graduation. Flowsheet Row Pulmonary Rehab from 12/03/2022 in [facility] Cardiac and Pulmonary Rehab  Education need identified 09/16/22  Date 09/19/22  Educator SB  Instruction Review Code 1- Verbalizes Understanding       Medication Safety: - Group verbal and visual instruction to review commonly prescribed medications for heart and lung disease. Reviews the medication, class of the drug, and side effects. Includes the steps to properly store meds and maintain the prescription regimen.  Written material given at graduation. Flowsheet Row Pulmonary Rehab from 12/03/2022 in [facility] Cardiac and Pulmonary Rehab  Education need identified 09/16/22  Date 12/03/22  Educator MC  Instruction Review Code 1- Verbalizes Understanding       Other: -Provides group and verbal instruction on various topics (see comments)   Knowledge Questionnaire Score:  Knowledge Questionnaire Score - 09/16/22 1331       Knowledge Questionnaire Score   Pre Score 12/18              Core Components/Risk Factors/Patient Goals at Admission:  Personal Goals and Risk Factors at Admission - 08/26/22 1633       Core Components/Risk Factors/Patient Goals on Admission    Weight Management Yes    Intervention Weight Management: Develop a combined nutrition and exercise program designed to reach desired caloric intake, while maintaining appropriate intake of nutrient and fiber, sodium and fats, and appropriate energy expenditure required for the weight goal.;Weight Management: Provide education and appropriate resources to help participant work on and attain dietary goals.    Admit Weight 109 lb (49.4 kg)    Expected Outcomes Short Term: Continue to assess and modify interventions until short term weight is achieved;Long Term: Adherence to nutrition  and physical activity/exercise program aimed toward attainment of established weight goal;Weight Maintenance: Understanding of the daily  nutrition guidelines, which includes 25-35% calories from fat, 7% or less cal from saturated fats, less than 200mg  cholesterol, less than 1.5gm of sodium, & 5 or more servings of fruits and vegetables daily;Understanding recommendations for meals to include 15-35% energy as protein, 25-35% energy from fat, 35-60% energy from carbohydrates, less than 200mg  of dietary cholesterol, 20-35 gm of total fiber daily;Understanding of distribution of calorie intake throughout the day with the consumption of 4-5 meals/snacks    Tobacco Cessation Yes    Number of packs per day 2 cigarettes per day    Intervention Assist the participant in steps to quit. Provide individualized education and counseling about committing to Tobacco Cessation, relapse prevention, and pharmacological support that can be provided by physician.    Improve shortness of breath with ADL's Yes    Intervention Provide education, individualized exercise plan and daily activity instruction to help decrease symptoms of SOB with activities of daily living.    Expected Outcomes Short Term: Improve cardiorespiratory fitness to achieve a reduction of symptoms when performing ADLs             Education:Diabetes - Individual verbal and written instruction to review signs/symptoms of diabetes, desired ranges of glucose level fasting, after meals and with exercise. Acknowledge that pre and post exercise glucose checks will be done for 3 sessions at entry of program.   Know Your Numbers and Heart Failure: - Group verbal and visual instruction to discuss disease risk factors for cardiac and pulmonary disease and treatment options.  Reviews associated critical values for Overweight/Obesity, Hypertension, Cholesterol, and Diabetes.  Discusses basics of heart failure: signs/symptoms and treatments.  Introduces Heart  Failure Zone chart for action plan for heart failure.  Written material given at graduation. Flowsheet Row [facility]monary [facility] from 12/03/2022 in [facility]  Date 10/10/22  Educator SB  Instruction Review Code 1- Verbalizes Understanding       Core Components/Risk Factors/Patient Goals Review:   Goals and Risk Factor Review     Row Name 10/24/22 [phone] 12/31/22 0950           Core Components/Risk Factors/Patient Goals Review   Personal Goals Review Improve shortness of breath with ADL's Improve shortness of breath with ADL's;Tobacco Cessation      Review Spoke to patient about their shortness of breath and what they can do to improve. Patient has been informed of breathing techniques when starting the program. Patient is informed to tell staff if they have had any med changes and that certain meds they are taking or not taking can be causing shortness of breath. Hope states that she is still struggling with tobacco cessation. She reports that she has cut back to smoking one cigarette a day, but will smoke half of it at one time and then come back and smoke the rest later. Hope states that her SOB has been steadily improving since starting the program. She especially notices being less SOB during exercise.      Expected Outcomes Short: Attend [facility] regularly to improve shortness of breath with ADL's. Long: maintain independence with ADL's Short: Continue to work towards quitting smoking. Long: Continue to manage lifestyle risk factors.               Core Components/Risk Factors/Patient Goals at Discharge (Final Review):   Goals and Risk Factor Review - 12/31/22 0950       Core Components/Risk Factors/Patient Goals Review   Personal Goals Review Improve shortness of breath with ADL's;Tobacco Cessation  Review Hope states that she is still struggling with tobacco cessation. She reports that she has cut back to smoking one cigarette a day, but will smoke half of it  at one time and then come back and smoke the rest later. Hope states that her SOB has been steadily improving since starting the program. She especially notices being less SOB during exercise.    Expected Outcomes Short: Continue to work towards quitting smoking. Long: Continue to manage lifestyle risk factors.             ITP Comments:  ITP Comments     Row Name 08/26/22 1628 09/16/22 1329 09/19/22 1008 10/09/22 1239 11/06/22 0854   ITP Comments Initial phone call completed. Dx can be found in [facility] 7/16. EP orientation scheduled for 8/22 at 2:30 pm. Completed and gym orientation. Initial ITP created and sent for review to Dr. Vida Rigger, Medical Director. First full day of exercise!  Patient was oriented to gym and equipment including functions, settings, policies, and procedures.  Patient's individual exercise prescription and treatment plan were reviewed.  All starting workloads were established based on the results of the 6 minute walk test done at initial orientation visit.  The plan for exercise progression was also introduced and progression will be customized based on patient's performance and goals. 30 Day review completed. Medical Director ITP review done, changes made as directed, and signed approval by Medical Director.    new to program 30 Day review completed. Medical Director ITP review done, changes made as directed, and signed approval by Medical Director.    Row Name 11/27/22 0932 12/25/22 1147 01/22/23 1259 02/19/23 0820     ITP Comments 30 Day review completed. Medical Director ITP review done, changes made as directed, and signed approval by Medical Director. 30 Day review completed. Medical Director ITP review done, changes made as directed, and signed approval by Medical Director. 30 Day review completed. Medical Director ITP review done, changes made as directed, and signed approval by Medical Director. 30 Day review completed. Medical Director ITP review done,  changes made as directed, and signed approval by Medical Director.             Comments: 30 day review

## 2023-02-20 ENCOUNTER — Ambulatory Visit: Payer: 59

## 2023-02-24 ENCOUNTER — Other Ambulatory Visit: Payer: Self-pay

## 2023-02-24 ENCOUNTER — Encounter: Payer: Self-pay | Admitting: Neurology

## 2023-02-24 DIAGNOSIS — R202 Paresthesia of skin: Secondary | ICD-10-CM

## 2023-02-25 ENCOUNTER — Ambulatory Visit: Payer: 59

## 2023-02-25 ENCOUNTER — Telehealth: Payer: Self-pay

## 2023-02-25 NOTE — Telephone Encounter (Signed)
 Attempted to call pt today. We had her scheduled to attend so we were calling to check in. Call went straight to voicemail. We left a message requesting a call back.

## 2023-02-27 ENCOUNTER — Ambulatory Visit: Payer: 59

## 2023-02-28 ENCOUNTER — Encounter: Payer: Self-pay | Admitting: *Deleted

## 2023-02-28 DIAGNOSIS — J439 Emphysema, unspecified: Secondary | ICD-10-CM

## 2023-02-28 NOTE — Progress Notes (Signed)
 Discharge Note for  Tammy Maxwell     1960/09/20        Early exit per patient. 19/36 visits completed.    6 Minute Walk     Row Name 09/16/22 1340         6 Minute Walk   Phase Initial     Distance 1020 feet     Walk Time 6 minutes     # of Rest Breaks 0     MPH 1.93     METS 3.66     RPE 9     Perceived Dyspnea  1     VO2 Peak 12.83     Symptoms Yes (comment)     Comments fatigue     Resting HR 75 bpm     Resting BP 116/70     Resting Oxygen Saturation  99 %     Exercise Oxygen Saturation  during 6 min walk 94 %     Max Ex. HR 107 bpm     Max Ex. BP 124/72     2 Minute Post BP 112/68       Interval HR   1 Minute HR 101     2 Minute HR 105     3 Minute HR 106     4 Minute HR 106     5 Minute HR 107     6 Minute HR 106     2 Minute Post HR 98     Interval Heart Rate? Yes       Interval Oxygen   Interval Oxygen? Yes     Baseline Oxygen Saturation % 99 %     1 Minute Oxygen Saturation % 99 %     1 Minute Liters of Oxygen 3 L     2 Minute Oxygen Saturation % 97 %     2 Minute Liters of Oxygen 3 L     3 Minute Oxygen Saturation % 97 %     3 Minute Liters of Oxygen 3 L     4 Minute Oxygen Saturation % 98 %     4 Minute Liters of Oxygen 3 L     5 Minute Oxygen Saturation % 97 %     5 Minute Liters of Oxygen 3 L     6 Minute Oxygen Saturation % 94 %     6 Minute Liters of Oxygen 3 L     2 Minute Post Oxygen Saturation % 95 %     2 Minute Post Liters of Oxygen 3 L

## 2023-02-28 NOTE — Progress Notes (Signed)
 Pulmonary Individual Treatment Plan  Patient Details  Name: Tammy Maxwell MRN: [id number] Date of Birth: Jan 13, 1960 Referring Provider:   Flowsheet Row Pulmonary Rehab from 09/16/2022 in [facility] Cardiac and Pulmonary Rehab  Referring Provider Dr. Raechel Chute, MD       Initial Encounter Date:  Flowsheet Row Pulmonary Rehab from 09/16/2022 in [facility] Cardiac and Pulmonary Rehab  Date 09/16/22       Visit Diagnosis: Pulmonary emphysema, unspecified emphysema type (HCC)  Patient's Home Medications on Admission:  Current Outpatient Medications:    albuterol (PROVENTIL) (2.5 MG/3ML) 0.083% nebulizer solution, Take 2.5 mg by nebulization 4 (four) times daily., Disp: , Rfl:    aspirin 81 MG chewable tablet, Chew 81 mg by mouth daily., Disp: , Rfl:    buPROPion (WELLBUTRIN SR) 150 MG 12 hr tablet, Take 150 mg by mouth 2 (two) times daily., Disp: , Rfl:    clonazePAM (KLONOPIN) 1 MG tablet, Take 1 mg by mouth at bedtime., Disp: , Rfl:    cyanocobalamin (VITAMIN B12) 500 MCG tablet, Take 500 mcg by mouth daily., Disp: , Rfl:    ferrous sulfate 324 MG TBEC, Take 324 mg by mouth., Disp: , Rfl:    fluticasone-salmeterol (ADVAIR HFA) 230-21 MCG/ACT inhaler, Inhale 2 puffs into the lungs 2 (two) times daily., Disp: 1 each, Rfl: 12   levothyroxine (SYNTHROID) 50 MCG tablet, Take 50 mcg by mouth daily., Disp: , Rfl:    meloxicam (MOBIC) 15 MG tablet, Take 15 mg by mouth at bedtime., Disp: , Rfl:    naloxone (NARCAN) nasal spray 4 mg/0.1 mL, SMARTSIG:Both Nares, Disp: , Rfl:    nicotine (NICODERM CQ - DOSED IN MG/24 HOURS) 14 mg/24hr patch, Place 14 mg onto the skin daily., Disp: , Rfl:    nicotine polacrilex (NICOTINE MINI) 2 MG lozenge, Take 1 lozenge (2 mg total) by mouth every 2 (two) hours as needed for smoking cessation., Disp: 72 lozenge, Rfl: 3   nitroGLYCERIN (NITROSTAT) 0.3 MG SL tablet, Place 0.3 mg under the tongue every 5 (five) minutes as needed for chest pain., Disp: , Rfl:    omeprazole  (PRILOSEC) 40 MG capsule, Take 40 mg by mouth 2 (two) times daily., Disp: , Rfl:    PARoxetine (PAXIL) 20 MG tablet, Take 20 mg by mouth at bedtime., Disp: , Rfl:    pravastatin (PRAVACHOL) 10 MG tablet, Take 10 mg by mouth daily., Disp: , Rfl:    QUEtiapine (SEROQUEL) 400 MG tablet, Take 400 mg by mouth at bedtime., Disp: , Rfl:    sucralfate (CARAFATE) 1 GM/10ML suspension, Take 2 g by mouth 3 times/day as needed-between meals & bedtime., Disp: , Rfl:    SUMAtriptan (IMITREX) 100 MG tablet, Take 100 mg by mouth every 2 (two) hours as needed for migraine. May repeat in 2 hours if headache persists or recurs., Disp: , Rfl:    Tiotropium Bromide Monohydrate (SPIRIVA RESPIMAT) 2.5 MCG/ACT AERS, Inhale 2 puffs into the lungs daily., Disp: 4 g, Rfl: 11   traMADol (ULTRAM) 50 MG tablet, Take 50 mg by mouth 4 (four) times daily., Disp: , Rfl:  No current facility-administered medications for this visit.  Facility-Administered Medications Ordered in Other Visits:    albuterol (PROVENTIL) (2.5 MG/3ML) 0.083% nebulizer solution 2.5 mg, 2.5 mg, Nebulization, Once, Raechel Chute, MD  Past Medical History: Past Medical History:  Diagnosis Date   Anemia    Arthritis    Bronchitis    Chest pain    COPD (chronic obstructive pulmonary disease) (HCC)  Depression    Dysrhythmia    GERD (gastroesophageal reflux disease)    Headache    Heart murmur    History of hiatal hernia    Pneumonia    Pneumothorax    Wears dentures    full upper    Tobacco Use: Social History   Tobacco Use  Smoking Status Every Day   Current packs/day: 2.00   Average packs/day: 2.0 packs/day for 49.1 years (98.3 ttl pk-yrs)   Types: Cigarettes   Start date: 1976  Smokeless Tobacco Never  Tobacco Comments   2 cigs daily- 07/23/2022    Labs: Review Flowsheet       Latest Ref Rng & Units 02/13/2022  Labs for ITP Cardiac and Pulmonary Rehab  Cholestrol 0 - 200 mg/dL 846   LDL (calc) 0 - 99 mg/dL 97   HDL-C >96  mg/dL 73   Trlycerides <295 mg/dL 87      Pulmonary Assessment Scores:  Pulmonary Assessment Scores     Row Name 09/16/22 1336         ADL UCSD   ADL Phase Entry     SOB Score total 82     Rest 3     Walk 4     Stairs 5     Bath 4     Dress 3     Shop 0       CAT Score   CAT Score 36       mMRC Score   mMRC Score 3              UCSD: Self-administered rating of dyspnea associated with activities of daily living (ADLs) 6-point scale (0 = "not at all" to 5 = "maximal or unable to do because of breathlessness")  Scoring Scores range from 0 to 120.  Minimally important difference is 5 units  CAT: CAT can identify the health impairment of COPD patients and is better correlated with disease progression.  CAT has a scoring range of zero to 40. The CAT score is classified into four groups of low (less than 10), medium (10 - 20), high (21-30) and very high (31-40) based on the impact level of disease on health status. A CAT score over 10 suggests significant symptoms.  A worsening CAT score could be explained by an exacerbation, poor medication adherence, poor inhaler technique, or progression of COPD or comorbid conditions.  CAT MCID is 2 points  mMRC: mMRC (Modified Medical Research Council) Dyspnea Scale is used to assess the degree of baseline functional disability in patients of respiratory disease due to dyspnea. No minimal important difference is established. A decrease in score of 1 point or greater is considered a positive change.   Pulmonary Function Assessment:   Exercise Target Goals: Exercise Program Goal: Individual exercise prescription set using results from initial 6 min walk test and THRR while considering  patient's activity barriers and safety.   Exercise Prescription Goal: Initial exercise prescription builds to 30-45 minutes a day of aerobic activity, 2-3 days per week.  Home exercise guidelines will be given to patient during program as part of  exercise prescription that the participant will acknowledge.  Education: Aerobic Exercise: - Group verbal and visual presentation on the components of exercise prescription. Introduces F.I.T.T principle from ACSM for exercise prescriptions.  Reviews F.I.T.T. principles of aerobic exercise including progression. Written material given at graduation. Flowsheet Row Pulmonary Rehab from 12/03/2022 in [facility] Cardiac and Pulmonary Rehab  Education need identified 09/16/22  Date  11/07/22  Educator MB  Instruction Review Code 1- Verbalizes Understanding       Education: Resistance Exercise: - Group verbal and visual presentation on the components of exercise prescription. Introduces F.I.T.T principle from ACSM for exercise prescriptions  Reviews F.I.T.T. principles of resistance exercise including progression. Written material given at graduation.    Education: Exercise & Equipment Safety: - Individual verbal instruction and demonstration of equipment use and safety with use of the equipment. Flowsheet Row Pulmonary Rehab from 12/03/2022 in [facility] Cardiac and Pulmonary Rehab  Date 09/16/22  Educator NT  Instruction Review Code 1- Verbalizes Understanding       Education: Exercise Physiology & General Exercise Guidelines: - Group verbal and written instruction with models to review the exercise physiology of the cardiovascular system and associated critical values. Provides general exercise guidelines with specific guidelines to those with heart or lung disease.  Flowsheet Row Pulmonary Rehab from 12/03/2022 in [facility] Cardiac and Pulmonary Rehab  Date 10/24/22  Educator MB  Instruction Review Code 1- [employer] Understanding       Education: Flexibility, Balance, Mind/Body Relaxation: - Group verbal and visual presentation with interactive activity on the components of exercise prescription. Introduces F.I.T.T principle from ACSM for exercise prescriptions. Reviews F.I.T.T. principles of  flexibility and balance exercise training including progression. Also discusses the mind body connection.  Reviews various relaxation techniques to help reduce and manage stress (i.e. Deep breathing, progressive muscle relaxation, and visualization). Balance handout provided to take home. Written material given at graduation.   Activity Barriers & Risk Stratification:   6 Minute Walk:  6 Minute Walk     Row Name 09/16/22 1340         6 Minute Walk   Phase Initial     Distance 1020 feet     Walk Time 6 minutes     # of Rest Breaks 0     MPH 1.93     METS 3.66     RPE 9     Perceived Dyspnea  1     VO2 Peak 12.83     Symptoms Yes (comment)     Comments fatigue     Resting HR 75 bpm     Resting BP 116/70     Resting Oxygen Saturation  99 %     Exercise Oxygen Saturation  during 6 min walk 94 %     Max Ex. HR 107 bpm     Max Ex. BP 124/72     2 Minute Post BP 112/68       Interval HR   1 Minute HR 101     2 Minute HR 105     3 Minute HR 106     4 Minute HR 106     5 Minute HR 107     6 Minute HR 106     2 Minute Post HR 98     Interval Heart Rate? Yes       Interval Oxygen   Interval Oxygen? Yes     Baseline Oxygen Saturation % 99 %     1 Minute Oxygen Saturation % 99 %     1 Minute Liters of Oxygen 3 L     2 Minute Oxygen Saturation % 97 %     2 Minute Liters of Oxygen 3 L     3 Minute Oxygen Saturation % 97 %     3 Minute Liters of Oxygen 3 L     4 Minute Oxygen  Saturation % 98 %     4 Minute Liters of Oxygen 3 L     5 Minute Oxygen Saturation % 97 %     5 Minute Liters of Oxygen 3 L     6 Minute Oxygen Saturation % 94 %     6 Minute Liters of Oxygen 3 L     2 Minute Post Oxygen Saturation % 95 %     2 Minute Post Liters of Oxygen 3 L             Oxygen Initial Assessment:   Oxygen Re-Evaluation:  Oxygen Re-Evaluation     Row Name 09/19/22 1009 10/24/22 0940 12/31/22 0954         Program Oxygen Prescription   Program Oxygen Prescription --  Continuous;E-Tanks Continuous;E-Tanks     Liters per minute -- 2 2       Home Oxygen   Home Oxygen Device -- Home Concentrator;E-Tanks Home Concentrator;E-Tanks     Sleep Oxygen Prescription -- Continuous Continuous     Liters per minute -- 2.5 2.5     Home Exercise Oxygen Prescription -- Continuous Continuous     Liters per minute -- 2.5 2.5     Home Resting Oxygen Prescription -- Continuous Continuous     Liters per minute -- 2.5 --     Compliance with Home Oxygen Use -- Yes Yes       Goals/Expected Outcomes   Short Term Goals -- To learn and demonstrate proper pursed lip breathing techniques or other breathing techniques.  To learn and demonstrate proper pursed lip breathing techniques or other breathing techniques.      Long  Term Goals -- Exhibits proper breathing techniques, such as pursed lip breathing or other method taught during program session Exhibits proper breathing techniques, such as pursed lip breathing or other method taught during program session     Comments Reviewed PLB technique with pt.  Talked about how it works and it's importance in maintaining their exercise saturations. Informed patient how to perform the Pursed Lipped breathing technique. Told patient to Inhale through the nose and out the mouth with pursed lips to keep their airways open, help oxygenate them better, practice when at rest or doing strenuous activity. Patient Verbalizes understanding of technique and will work on and be reiterated during [facility]. Hope states that she continues to practice PLB. She does believe that her breathing has improved with exercise since practining PLB. She continues to be consistent and compliant with all of her pulmonary medications.     Goals/Expected Outcomes Short: Become more profiecient at using PLB.   Long: Become independent at using PLB. Short: use PLB with exertion. Long: use PLB on exertion proficiently and independently. Short: use PLB with exertion. Long: use PLB on  exertion proficiently and independently.              Oxygen Discharge (Final Oxygen Re-Evaluation):  Oxygen Re-Evaluation - 12/31/22 0954       Program Oxygen Prescription   Program Oxygen Prescription Continuous;E-Tanks    Liters per minute 2      Home Oxygen   Home Oxygen Device Home Concentrator;E-Tanks    Sleep Oxygen Prescription Continuous    Liters per minute 2.5    Home Exercise Oxygen Prescription Continuous    Liters per minute 2.5    Home Resting Oxygen Prescription Continuous    Compliance with Home Oxygen Use Yes      Goals/Expected Outcomes   Short Term  Goals To learn and demonstrate proper pursed lip breathing techniques or other breathing techniques.     Long  Term Goals Exhibits proper breathing techniques, such as pursed lip breathing or other method taught during program session    Comments Hope states that she continues to practice PLB. She does believe that her breathing has improved with exercise since practining PLB. She continues to be consistent and compliant with all of her pulmonary medications.    Goals/Expected Outcomes Short: use PLB with exertion. Long: use PLB on exertion proficiently and independently.             Initial Exercise Prescription:  Initial Exercise Prescription - 09/16/22 1300       Date of Initial Exercise RX and Referring Provider   Date 09/16/22    Referring Provider Dr. Raechel Chute, MD      Oxygen   Oxygen Continuous    Liters 3    Maintain Oxygen Saturation 88% or higher      Treadmill   MPH 1.8    Grade 0.5    Minutes 15    METs 2.5      Recumbant Bike   Level 2    RPM 50    Watts 25    Minutes 15    METs 3.66      NuStep   Level 2    SPM 80    Minutes 15    METs 3.66      Prescription Details   Frequency (times per week) 2    Duration Progress to 30 minutes of continuous aerobic without signs/symptoms of physical distress      Intensity   THRR 40-80% of Max Heartrate 109-143    Ratings  of Perceived Exertion 11-13    Perceived Dyspnea 0-4      Progression   Progression Continue to progress workloads to maintain intensity without signs/symptoms of physical distress.      Resistance Training   Training Prescription Yes    Weight 3 lb    Reps 10-15             Perform Capillary Blood Glucose checks as needed.  Exercise Prescription Changes:   Exercise Prescription Changes     Row Name 09/16/22 1300 09/24/22 1400 10/11/22 0900 10/25/22 0900 11/07/22 1500     Response to Exercise   Blood Pressure (Admit) 116/70 100/62 100/60 120/72 96/60   Blood Pressure (Exercise) 124/72 108/62 134/72 126/66 108/62   Blood Pressure (Exit) 112/68 98/62 102/60 102/60 102/64   Heart Rate (Admit) 75 bpm 75 bpm 87 bpm 69 bpm 80 bpm   Heart Rate (Exercise) 107 bpm 92 bpm 96 bpm 91 bpm 100 bpm   Heart Rate (Exit) 98 bpm 80 bpm 80 bpm 69 bpm 84 bpm   Oxygen Saturation (Admit) 99 % 97 % 97 % 97 % 96 %   Oxygen Saturation (Exercise) 94 % 97 % 90 % 85 % 84 %   Oxygen Saturation (Exit) 95 % 98 % 96 % 98 % 92 %   Rating of Perceived Exertion (Exercise) 9 9 13 13 14    Perceived Dyspnea (Exercise) 1 1 1 1 2    Symptoms Fatigue none none none none   Comments Results -- -- -- --   Duration -- Progress to 30 minutes of  aerobic without signs/symptoms of physical distress Progress to 30 minutes of  aerobic without signs/symptoms of physical distress Progress to 30 minutes of  aerobic without signs/symptoms of physical  distress Progress to 30 minutes of  aerobic without signs/symptoms of physical distress   Intensity -- THRR unchanged THRR unchanged THRR unchanged THRR unchanged     Progression   Progression -- Continue to progress workloads to maintain intensity without signs/symptoms of physical distress. Continue to progress workloads to maintain intensity without signs/symptoms of physical distress. Continue to progress workloads to maintain intensity without signs/symptoms of physical  distress. Continue to progress workloads to maintain intensity without signs/symptoms of physical distress.   Average METs -- 2.5 2.31 2.27 2.03     Resistance Training   Training Prescription -- Yes Yes Yes Yes   Weight -- 3 lb 3 lb 3 lb 3 lb   Reps -- 10-15 10-15 10-15 10-15     Interval Training   Interval Training -- No No No No     Oxygen   Oxygen -- Continuous Continuous Continuous Continuous   Liters -- 3 3 3 2      Treadmill   MPH -- -- -- 1.2 1.4   Grade -- -- -- 0 0   Minutes -- -- -- 15 15   METs -- -- -- 1.92 2.07     Recumbant Bike   Level -- 1 1 2  --   Watts -- 12 12 12  --   Minutes -- 15 15 15  --   METs -- 2.5 2.77 2.78 --     NuStep   Level -- 1 2 3 3    Minutes -- 15 15 15 15    METs -- 2.5 1.4 2.1 2.1     Biostep-RELP   Level -- -- -- 1 1   Minutes -- -- -- 15 15   METs -- -- -- -- 2     Oxygen   Maintain Oxygen Saturation -- 88% or higher 88% or higher 88% or higher 88% or higher    Row Name 12/04/22 1000 12/12/22 1000 12/19/22 1600 01/02/23 1500 01/17/23 0900     Response to Exercise   Blood Pressure (Admit) 122/68 -- 110/70 100/60 98/56   Blood Pressure (Exit) -- -- 102/54 122/66 102/60   Heart Rate (Admit) 84 bpm -- 83 bpm 74 bpm 74 bpm   Heart Rate (Exercise) 83 bpm -- 104 bpm 92 bpm 112 bpm   Heart Rate (Exit) 76 bpm -- 81 bpm 94 bpm 87 bpm   Oxygen Saturation (Admit) 97 % -- 92 % 95 % 98 %   Oxygen Saturation (Exercise) 89 % -- 91 % 90 % 95 %   Oxygen Saturation (Exit) 96 % -- 96 % 94 % 98 %   Rating of Perceived Exertion (Exercise) 13 -- 13 13 13    Perceived Dyspnea (Exercise) 1 -- 1 1 1    Symptoms none -- none none none   Duration Progress to 30 minutes of  aerobic without signs/symptoms of physical distress -- Progress to 30 minutes of  aerobic without signs/symptoms of physical distress Progress to 30 minutes of  aerobic without signs/symptoms of physical distress Progress to 30 minutes of  aerobic without signs/symptoms of physical  distress   Intensity THRR unchanged -- THRR unchanged THRR unchanged THRR unchanged     Progression   Progression Continue to progress workloads to maintain intensity without signs/symptoms of physical distress. -- Continue to progress workloads to maintain intensity without signs/symptoms of physical distress. Continue to progress workloads to maintain intensity without signs/symptoms of physical distress. Continue to progress workloads to maintain intensity without signs/symptoms of physical distress.   Average  METs 1.85 -- 2.25 2.19 2.13     Resistance Training   Training Prescription Yes -- Yes Yes Yes   Weight 3 lb -- 3 lb 3 lb 3 lb   Reps 10-15 -- 10-15 10-15 10-15     Interval Training   Interval Training No -- No No No     Oxygen   Oxygen Continuous -- Continuous Continuous Continuous   Liters 2 -- 2 2 2      Treadmill   MPH -- -- 1.8 1.8 1.5   Grade -- -- 0 0 0   Minutes -- -- 15 15 15    METs -- -- 2.38 2.38 2.15     NuStep   Level 2 -- 5 3 3    Minutes 15 -- 30 15 15    METs 2.1 -- 2.7 2 2.1     Home Exercise Plan   Plans to continue exercise at -- Home (comment)  Walking outside and using 3 lb hand weights Home (comment)  Walking outside and using 3 lb hand weights Home (comment)  Walking outside and using 3 lb hand weights Home (comment)  Walking outside and using 3 lb hand weights   Frequency -- Add 3 additional days to program exercise sessions. Add 3 additional days to program exercise sessions. Add 3 additional days to program exercise sessions. Add 3 additional days to program exercise sessions.   Initial Home Exercises Provided -- 12/12/22 12/12/22 12/12/22 12/12/22     Oxygen   Maintain Oxygen Saturation 88% or higher 88% or higher 88% or higher 88% or higher 88% or higher            Exercise Comments:   Exercise Comments     Row Name 09/19/22 1008           Exercise Comments First full day of exercise!  Patient was oriented to gym and equipment  including functions, settings, policies, and procedures.  Patient's individual exercise prescription and treatment plan were reviewed.  All starting workloads were established based on the results of the 6 minute walk test done at initial orientation visit.  The plan for exercise progression was also introduced and progression will be customized based on patient's performance and goals.                Exercise Goals and Review:   Exercise Goals     Row Name 09/16/22 1342             Exercise Goals   Increase Physical Activity Yes       Intervention Provide advice, education, support and counseling about physical activity/exercise needs.;Develop an individualized exercise prescription for aerobic and resistive training based on initial evaluation findings, risk stratification, comorbidities and participant's personal goals.       Expected Outcomes Short Term: Attend rehab on a regular basis to increase amount of physical activity.;Long Term: Add in home exercise to make exercise part of routine and to increase amount of physical activity.;Long Term: Exercising regularly at least 3-5 days a week.       Increase Strength and Stamina Yes       Intervention Provide advice, education, support and counseling about physical activity/exercise needs.;Develop an individualized exercise prescription for aerobic and resistive training based on initial evaluation findings, risk stratification, comorbidities and participant's personal goals.       Expected Outcomes Short Term: Increase workloads from initial exercise prescription for resistance, speed, and METs.;Short Term: Perform resistance training exercises routinely during rehab and add  in resistance training at home;Long Term: Improve cardiorespiratory fitness, muscular endurance and strength as measured by increased METs and functional capacity ( )       Able to understand and use rate of perceived exertion (RPE) scale Yes       Intervention  Provide education and explanation on how to use RPE scale       Expected Outcomes Short Term: Able to use RPE daily in rehab to express subjective intensity level;Long Term:  Able to use RPE to guide intensity level when exercising independently       Able to understand and use Dyspnea scale Yes       Intervention Provide education and explanation on how to use Dyspnea scale       Expected Outcomes Short Term: Able to use Dyspnea scale daily in rehab to express subjective sense of shortness of breath during exertion;Long Term: Able to use Dyspnea scale to guide intensity level when exercising independently       Knowledge and understanding of Target Heart Rate Range (THRR) Yes       Intervention Provide education and explanation of THRR including how the numbers were predicted and where they are located for reference       Expected Outcomes Short Term: Able to state/look up THRR;Long Term: Able to use THRR to govern intensity when exercising independently;Short Term: Able to use daily as guideline for intensity in rehab       Able to check pulse independently Yes       Intervention Provide education and demonstration on how to check pulse in carotid and radial arteries.;Review the importance of being able to check your own pulse for safety during independent exercise       Expected Outcomes Short Term: Able to explain why pulse checking is important during independent exercise;Long Term: Able to check pulse independently and accurately       Understanding of Exercise Prescription Yes       Intervention Provide education, explanation, and written materials on patient's individual exercise prescription       Expected Outcomes Short Term: Able to explain program exercise prescription;Long Term: Able to explain home exercise prescription to exercise independently                Exercise Goals Re-Evaluation :  Exercise Goals Re-Evaluation     Row Name 09/19/22 1008 09/24/22 1437 10/11/22 0934  10/25/22 0943 11/07/22 1545     Exercise Goal Re-Evaluation   Exercise Goals Review Increase Physical Activity;Able to understand and use rate of perceived exertion (RPE) scale;Knowledge and understanding of Target Heart Rate Range (THRR);Understanding of Exercise Prescription;Increase Strength and Stamina;Able to check pulse independently;Able to understand and use Dyspnea scale Increase Physical Activity;Increase Strength and Stamina;Understanding of Exercise Prescription Increase Physical Activity;Increase Strength and Stamina;Understanding of Exercise Prescription Increase Physical Activity;Increase Strength and Stamina;Understanding of Exercise Prescription Increase Physical Activity;Increase Strength and Stamina;Understanding of Exercise Prescription   Comments Reviewed RPE  and dyspnea scale, THR and program prescription with pt today.  Pt voiced understanding and was given a copy of goals to take home. Hope is off to a good start in the program. She attended her first and only appointment during this review. During the session she used the T4 nustep and recumbent bike at level 1. We will continue to monitor her progress in the program. Hope is doing well in the program. She continues to do well at level 1 on the recumbent bike and improved to level 2 on  the T4 nustep. She also continues to do well with 3 lb hand weights for resistance training. We will continue to monitor her progress in the program. Hope continues to do well in the program. She recently improved to level 2 on the recumbent bike and level 3 on the T4 nustep. She also began using the treadmill at a speed of 1.2 mph with no incline. We will continue to monitor her progress in the program. Hope is doing well in rehab. She increased her treadmill workload to a speed of 1.4 mph with no incline. She also has stayed consistent on the biostep at level 1 and T4 nustep at level 3. We will continue to monitor her progress in the program.    Expected Outcomes Short: Use RPE daily to regulate intensity.  Long: Follow program prescription in THR. Short: Continue to follow current exercise prescription. Long: Continue exercise to improve strength and stamina. Short: Try level 2 on the recumbent bike. Long: Continue exercise to improve strength and stamina. Short: Continue to walk on treadmill. Long: Continue exercise to improve strength and stamina. Short: Increase to level 2 on the biostep. Long: Continue exercise to improve strength and stamina.    Row Name 12/04/22 1052 12/12/22 1013 12/19/22 1607 12/31/22 0937 01/02/23 1508     Exercise Goal Re-Evaluation   Exercise Goals Review Increase Physical Activity;Increase Strength and Stamina;Understanding of Exercise Prescription -- Increase Physical Activity;Increase Strength and Stamina;Understanding of Exercise Prescription Increase Physical Activity;Increase Strength and Stamina;Understanding of Exercise Prescription Increase Physical Activity;Increase Strength and Stamina;Understanding of Exercise Prescription   Comments Hope continues to do well in rehab. She was only able to attend one session during this review. During the one session she only did the T4 nustep at level 2. We will continue to monitor her progress in the program. Reviewed home exercise with pt today from 09:39 to 09:50.  Pt plans to walk outside at home under the right weather conditions and use 3 lb hand weights for resistance training as well.  Reviewed THR, pulse, RPE, sign and symptoms, pulse oximetery and when to call 911 or MD.  Also discussed weather considerations and indoor options.  Pt voiced understanding. Hope continues to do well in the program. She continues to walk on the treadmill at a speed of 1.8 mph with no incline. She also improved to level 5 on the T4 nustep and was able to work for 30 minutes on this machine. We will continue to monitor her progress in the program. Hope has been doing well with exercise.  She states that she has not been walking at home on days away from rehab due to the cold weather. However, she does state that she has space at home inside where she can walk and she can walk her stairs as well. She also states that she has noticed some benefits from exercising in rehab, specifically that she has more energy and more stamina. Her pulmonologist also noticed some progress, and has encouraged her to continue with her exercise as well. We will continue to monitor her progress. Hope was only able to attend one rehab session during this review period. During this one session she was able to maintain her workload on the treadmill at 1.74mph. She also used the T4 nustep but did decrease her level to 3. We will continue to monitor her progress in the program.   Expected Outcomes Short; attend rehab more regularyly. Long: Continue exercise to improve strength and stamina. Short: Begin walking at  home on days away from rehab. Long: Continue exercise to improve strength and stamina. Short: Begin to progressively increase treadmill workload. Long: Continue exercise to improve strength and stamina. Short: Begin walking at home on days away from rehab. Long: Continue exercise to improve strength and stamina. Short: Increase T4 nustep level to 5. Long: continue exercise to increase strength and stamina.    Row Name 01/17/23 0932 01/27/23 1612 02/26/23 1119         Exercise Goal Re-Evaluation   Exercise Goals Review Increase Physical Activity;Increase Strength and Stamina;Understanding of Exercise Prescription Increase Physical Activity;Increase Strength and Stamina;Understanding of Exercise Prescription Increase Physical Activity;Increase Strength and Stamina;Understanding of Exercise Prescription     Comments Hope continues to do well in rehab. She is doing well on the treadmill at a speed of 1.5 mph with no incline. She also continues to do well at level 3 on the T4 nustep. We will continue to monitor her  progress in the program. Hope has not attended rehab since the last review. She did call to let staff know that she has broken a bone in her foot and will need to be out of rehab for two to three weeks. We will continue to monitor her progress when she returns to the program. Hope continues to be out of rehab on medical hold due to a broken bone in her foot. We will continue to monitor her progress when she returns to the program.     Expected Outcomes Short: Increase T4 nustep level to 4. Long: continue exercise to increase strength and stamina. Short: Return to rehab when appropriate. Long: Graduate from the program. Short: Return to rehab when appropriate. Long: Graduate from the program.              Discharge Exercise Prescription (Final Exercise Prescription Changes):  Exercise Prescription Changes - 01/17/23 0900       Response to Exercise   Blood Pressure (Admit) 98/56    Blood Pressure (Exit) 102/60    Heart Rate (Admit) 74 bpm    Heart Rate (Exercise) 112 bpm    Heart Rate (Exit) 87 bpm    Oxygen Saturation (Admit) 98 %    Oxygen Saturation (Exercise) 95 %    Oxygen Saturation (Exit) 98 %    Rating of Perceived Exertion (Exercise) 13    Perceived Dyspnea (Exercise) 1    Symptoms none    Duration Progress to 30 minutes of  aerobic without signs/symptoms of physical distress    Intensity THRR unchanged      Progression   Progression Continue to progress workloads to maintain intensity without signs/symptoms of physical distress.    Average METs 2.13      Resistance Training   Training Prescription Yes    Weight 3 lb    Reps 10-15      Interval Training   Interval Training No      Oxygen   Oxygen Continuous    Liters 2      Treadmill   MPH 1.5    Grade 0    Minutes 15    METs 2.15      NuStep   Level 3    Minutes 15    METs 2.1      Home Exercise Plan   Plans to continue exercise at Home (comment)   Walking outside and using 3 lb hand weights    Frequency Add 3 additional days to program exercise sessions.    Initial Home Exercises  Provided 12/12/22      Oxygen   Maintain Oxygen Saturation 88% or higher             Nutrition:  Target Goals: Understanding of nutrition guidelines, daily intake of sodium 1500mg , cholesterol 200mg , calories 30% from fat and 7% or less from saturated fats, daily to have 5 or more servings of fruits and vegetables.  Education: All About Nutrition: -Group instruction provided by verbal, written material, interactive activities, discussions, models, and posters to present general guidelines for heart healthy nutrition including fat, fiber, MyPlate, the role of sodium in heart healthy nutrition, utilization of the nutrition label, and utilization of this knowledge for meal planning. Follow up email sent as well. Written material given at graduation.   Biometrics:  Pre Biometrics - 09/16/22 1343       Pre Biometrics   Height 5\' 8"  (1.727 m)    Weight 106 lb 1.6 oz (48.1 kg)    Waist Circumference 26.5 inches    Hip Circumference 33 inches    Waist to Hip Ratio 0.8 %    BMI (Calculated) 16.14    Single Leg Stand 4.8 seconds              Nutrition Therapy Plan and Nutrition Goals:  Nutrition Therapy & Goals - 09/16/22 1332       Nutrition Therapy   RD appointment deferred Yes      Intervention Plan   Intervention Prescribe, educate and counsel regarding individualized specific dietary modifications aiming towards targeted core components such as weight, hypertension, lipid management, diabetes, heart failure and other comorbidities.    Expected Outcomes Short Term Goal: Understand basic principles of dietary content, such as calories, fat, sodium, cholesterol and nutrients.;Short Term Goal: A plan has been developed with personal nutrition goals set during dietitian appointment.;Long Term Goal: Adherence to prescribed nutrition plan.             Nutrition  Assessments:  MEDIFICTS Score Key: >=70 Need to make dietary changes  40-70 Heart Healthy Diet <= 40 Therapeutic Level Cholesterol Diet  Flowsheet Row Pulmonary Rehab from 09/16/2022 in [facility] Cardiac and Pulmonary Rehab  Picture Your Plate Total Score on Admission 52      Picture Your Plate Scores: <16 Unhealthy dietary pattern with much room for improvement. 41-50 Dietary pattern unlikely to meet recommendations for good health and room for improvement. 51-60 More healthful dietary pattern, with some room for improvement.  >60 Healthy dietary pattern, although there may be some specific behaviors that could be improved.   Nutrition Goals Re-Evaluation:  Nutrition Goals Re-Evaluation     Row Name 10/24/22 0941 12/31/22 0948           Goals   Current Weight 111 lb (50.3 kg) 112 lb 4.8 oz (50.9 kg)      Comment Patient was informed on why it is important to maintain a balanced diet when dealing with Respiratory issues. Explained that it takes a lot of energy to breath and when they are short of breath often they will need to have a good diet to help keep up with the calories they are expending for breathing. Hope has not met with the RD but would like to. We will schedule her an appt to meet with the RD. She states that she has a poor appetite and does not want to eat most of the time.      Expected Outcome Short: Choose and plan snacks accordingly to patients caloric intake to improve  breathing. Long: Maintain a diet independently that meets their caloric intake to aid in daily shortness of breath. Short: Meet with RD. Long: Continue to maintain a diet that meets caloric intake to aid in daily SOB.               Nutrition Goals Discharge (Final Nutrition Goals Re-Evaluation):  Nutrition Goals Re-Evaluation - 12/31/22 0948       Goals   Current Weight 112 lb 4.8 oz (50.9 kg)    Comment Hope has not met with the RD but would like to. We will schedule her an appt to meet with the  RD. She states that she has a poor appetite and does not want to eat most of the time.    Expected Outcome Short: Meet with RD. Long: Continue to maintain a diet that meets caloric intake to aid in daily SOB.             Psychosocial: Target Goals: Acknowledge presence or absence of significant depression and/or stress, maximize coping skills, provide positive support system. Participant is able to verbalize types and ability to use techniques and skills needed for reducing stress and depression.   Education: Stress, Anxiety, and Depression - Group verbal and visual presentation to define topics covered.  Reviews how body is impacted by stress, anxiety, and depression.  Also discusses healthy ways to reduce stress and to treat/manage anxiety and depression.  Written material given at graduation.   Education: Sleep Hygiene -Provides group verbal and written instruction about how sleep can affect your health.  Define sleep hygiene, discuss sleep cycles and impact of sleep habits. Review good sleep hygiene tips.    Initial Review & Psychosocial Screening:   Quality of Life Scores:  Scores of 19 and below usually indicate a poorer quality of life in these areas.  A difference of  2-3 points is a clinically meaningful difference.  A difference of 2-3 points in the total score of the Quality of Life Index has been associated with significant improvement in overall quality of life, self-image, physical symptoms, and general health in studies assessing change in quality of life.  PHQ-9: Review Flowsheet       12/31/2022 10/24/2022 09/16/2022 03/18/2022  Depression screen PHQ 2/9  Decreased Interest 2 2 2 1   Down, Depressed, Hopeless 3 1 2 1   PHQ - 2 Score 5 3 4 2   Altered sleeping 3 1 1  -  Tired, decreased energy 2 2 2  -  Change in appetite 1 0 1 -  Feeling bad or failure about yourself  2 2 2  -  Trouble concentrating 2 1 1  -  Moving slowly or fidgety/restless 0 1 1 -  Suicidal  thoughts 0 0 0 -  PHQ-9 Score 15 10 12  -  Difficult doing work/chores Very difficult Somewhat difficult Somewhat difficult -   Interpretation of Total Score  Total Score Depression Severity:  1-4 = Minimal depression, 5-9 = Mild depression, 10-14 = Moderate depression, 15-19 = Moderately severe depression, 20-27 = Severe depression   Psychosocial Evaluation and Intervention:   Psychosocial Re-Evaluation:  Psychosocial Re-Evaluation     Row Name 10/24/22 0945 12/31/22 0941           Psychosocial Re-Evaluation   Current issues with Current Stress Concerns;History of Depression;Current Psychotropic Meds Current Stress Concerns;Current Depression;Current Sleep Concerns;Current Psychotropic Meds      Comments Reviewed patient health questionnaire (PHQ-9) with patient for follow up. Previously, patients score indicated signs/symptoms of depression.  Reviewed  to see if patient is improving symptom wise while in program.  Score improved and patient states that it is because she is able to come to exercise class. Hope states that she is depressed as she is frustrated with herself for not making lifestyle changes sooner to prevent current health concerns. And because of this she has stress with moving forward with her condition. She does report that her son has been a good support for her and helps take good care of her. She does report that exercise in rehab has been a good stress reliever for her. She states that she is struggling with her sleep as well, as she has trouble falling asleep and staying asleep. We will continue to encourage her to maintain a positive outlook.      Expected Outcomes Short: Continue to attend [facility] regularly for regular exercise and social engagement. Long: Continue to improve symptoms and manage a positive mental state. Short: Continue to attend [facility] regularly for regular exercise and social engagement. Long: Continue to maintain positive mental state.       Interventions Encouraged to attend Pulmonary Rehabilitation for the exercise Encouraged to attend Pulmonary Rehabilitation for the exercise      Continue Psychosocial Services  Follow up required by staff Follow up required by staff               Psychosocial Discharge (Final Psychosocial Re-Evaluation):  Psychosocial Re-Evaluation - 12/31/22 0941       Psychosocial Re-Evaluation   Current issues with Current Stress Concerns;Current Depression;Current Sleep Concerns;Current Psychotropic Meds    Comments Hope states that she is depressed as she is frustrated with herself for not making lifestyle changes sooner to prevent current health concerns. And because of this she has stress with moving forward with her condition. She does report that her son has been a good support for her and helps take good care of her. She does report that exercise in rehab has been a good stress reliever for her. She states that she is struggling with her sleep as well, as she has trouble falling asleep and staying asleep. We will continue to encourage her to maintain a positive outlook.    Expected Outcomes Short: Continue to attend [facility] regularly for regular exercise and social engagement. Long: Continue to maintain positive mental state.    Interventions Encouraged to attend Pulmonary Rehabilitation for the exercise    Continue Psychosocial Services  Follow up required by staff             Education: Education Goals: Education classes will be provided on a weekly basis, covering required topics. Participant will state understanding/return demonstration of topics presented.  Learning Barriers/Preferences:   General Pulmonary Education Topics:  Infection Prevention: - Provides verbal and written material to individual with discussion of infection control including proper hand washing and proper equipment cleaning during exercise session. Flowsheet Row Pulmonary Rehab from 12/03/2022 in [facility] Cardiac  and Pulmonary Rehab  Date 09/16/22  Educator NT  Instruction Review Code 1- Verbalizes Understanding       Falls Prevention: - Provides verbal and written material to individual with discussion of falls prevention and safety. Flowsheet Row Pulmonary Rehab from 12/03/2022 in [facility] Cardiac and Pulmonary Rehab  Date 09/16/22  Educator NT  Instruction Review Code 1- Verbalizes Understanding       Chronic Lung Disease Review: - Group verbal instruction with posters, models, PowerPoint presentations and videos,  to review new updates, new respiratory medications, new advancements in procedures  and treatments. Providing information on websites and "800" numbers for continued self-education. Includes information about supplement oxygen, available portable oxygen systems, continuous and intermittent flow rates, oxygen safety, concentrators, and Medicare reimbursement for oxygen. Explanation of Pulmonary Drugs, including class, frequency, complications, importance of spacers, rinsing mouth after steroid MDI's, and proper cleaning methods for nebulizers. Review of basic lung anatomy and physiology related to function, structure, and complications of lung disease. Review of risk factors. Discussion about methods for diagnosing sleep apnea and types of masks and machines for OSA. Includes a review of the use of types of environmental controls: home humidity, furnaces, filters, dust mite/pet prevention, HEPA vacuums. Discussion about weather changes, air quality and the benefits of nasal washing. Instruction on Warning signs, infection symptoms, calling MD promptly, preventive modes, and value of vaccinations. Review of effective airway clearance, coughing and/or vibration techniques. Emphasizing that all should Create an Action Plan. Written material given at graduation. Flowsheet Row Pulmonary Rehab from 12/03/2022 in [facility] Cardiac and Pulmonary Rehab  Education need identified 09/16/22  Date 10/03/22   Educator [facility]  Instruction Review Code 1- Verbalizes Understanding       AED/CPR: - Group verbal and written instruction with the use of models to demonstrate the basic use of the AED with the basic ABC's of resuscitation.    Anatomy and Cardiac Procedures: - Group verbal and visual presentation and models provide information about basic cardiac anatomy and function. Reviews the testing methods done to diagnose heart disease and the outcomes of the test results. Describes the treatment choices: Medical Management, Angioplasty, or Coronary Bypass Surgery for treating various heart conditions including Myocardial Infarction, Angina, Valve Disease, and Cardiac Arrhythmias.  Written material given at graduation. Flowsheet Row Pulmonary Rehab from 12/03/2022 in [facility] Cardiac and Pulmonary Rehab  Education need identified 09/16/22  Date 09/19/22  Educator SB  Instruction Review Code 1- Verbalizes Understanding       Medication Safety: - Group verbal and visual instruction to review commonly prescribed medications for heart and lung disease. Reviews the medication, class of the drug, and side effects. Includes the steps to properly store meds and maintain the prescription regimen.  Written material given at graduation. Flowsheet Row Pulmonary Rehab from 12/03/2022 in [facility] Cardiac and Pulmonary Rehab  Education need identified 09/16/22  Date 12/03/22  Educator [facility]  Instruction Review Code 1- Verbalizes Understanding       Other: -Provides group and verbal instruction on various topics (see comments)   Knowledge Questionnaire Score:  Knowledge Questionnaire Score - 09/16/22 1331       Knowledge Questionnaire Score   Pre Score 12/18              Core Components/Risk Factors/Patient Goals at Admission:   Education:Diabetes - Individual verbal and written instruction to review signs/symptoms of diabetes, desired ranges of glucose level fasting, after meals and with exercise.  Acknowledge that pre and post exercise glucose checks will be done for 3 sessions at entry of program.   Know Your Numbers and Heart Failure: - Group verbal and visual instruction to discuss disease risk factors for cardiac and pulmonary disease and treatment options.  Reviews associated critical values for Overweight/Obesity, Hypertension, Cholesterol, and Diabetes.  Discusses basics of heart failure: signs/symptoms and treatments.  Introduces Heart Failure Zone chart for action plan for heart failure.  Written material given at graduation. Flowsheet Row Pulmonary Rehab from 12/03/2022 in [facility] Cardiac and Pulmonary Rehab  Date 10/10/22  Educator SB  Instruction Review Code 1- [employer]  Understanding       Core Components/Risk Factors/Patient Goals Review:   Goals and Risk Factor Review     Row Name 10/24/22 0942 12/31/22 0950           Core Components/Risk Factors/Patient Goals Review   Personal Goals Review Improve shortness of breath with ADL's Improve shortness of breath with ADL's;Tobacco Cessation      Review Spoke to patient about their shortness of breath and what they can do to improve. Patient has been informed of breathing techniques when starting the program. Patient is informed to tell staff if they have had any med changes and that certain meds they are taking or not taking can be causing shortness of breath. Hope states that she is still struggling with tobacco cessation. She reports that she has cut back to smoking one cigarette a day, but will smoke half of it at one time and then come back and smoke the rest later. Hope states that her SOB has been steadily improving since starting the program. She especially notices being less SOB during exercise.      Expected Outcomes Short: Attend [facility] regularly to improve shortness of breath with ADL's. Long: maintain independence with ADL's Short: Continue to work towards quitting smoking. Long: Continue to manage lifestyle risk  factors.               Core Components/Risk Factors/Patient Goals at Discharge (Final Review):   Goals and Risk Factor Review - 12/31/22 0950       Core Components/Risk Factors/Patient Goals Review   Personal Goals Review Improve shortness of breath with ADL's;Tobacco Cessation    Review Hope states that she is still struggling with tobacco cessation. She reports that she has cut back to smoking one cigarette a day, but will smoke half of it at one time and then come back and smoke the rest later. Hope states that her SOB has been steadily improving since starting the program. She especially notices being less SOB during exercise.    Expected Outcomes Short: Continue to work towards quitting smoking. Long: Continue to manage lifestyle risk factors.             ITP Comments:  ITP Comments     Row Name 09/16/22 1329 09/19/22 1008 10/09/22 1239 11/06/22 0854 11/27/22 0932   ITP Comments Completed and gym orientation. Initial ITP created and sent for review to Dr. Vida Rigger, Medical Director. First full day of exercise!  Patient was oriented to gym and equipment including functions, settings, policies, and procedures.  Patient's individual exercise prescription and treatment plan were reviewed.  All starting workloads were established based on the results of the 6 minute walk test done at initial orientation visit.  The plan for exercise progression was also introduced and progression will be customized based on patient's performance and goals. 30 Day review completed. Medical Director ITP review done, changes made as directed, and signed approval by Medical Director.    new to program 30 Day review completed. Medical Director ITP review done, changes made as directed, and signed approval by Medical Director. 30 Day review completed. Medical Director ITP review done, changes made as directed, and signed approval by Medical Director.    Row Name 12/25/22 1147 01/22/23 1259 02/19/23  0820 02/28/23 0804     ITP Comments 30 Day review completed. Medical Director ITP review done, changes made as directed, and signed approval by Medical Director. 30 Day review completed. Medical Director ITP review done, changes  made as directed, and signed approval by Medical Director. 30 Day review completed. Medical Director ITP review done, changes made as directed, and signed approval by Medical Director. Hope called and asked to be discharged.             Comments: Discharge ITP

## 2023-03-04 ENCOUNTER — Ambulatory Visit: Payer: 59

## 2023-03-06 ENCOUNTER — Ambulatory Visit: Payer: 59

## 2023-03-06 ENCOUNTER — Encounter: Payer: 59 | Admitting: Neurology

## 2023-03-11 ENCOUNTER — Ambulatory Visit: Payer: 59

## 2023-03-13 ENCOUNTER — Emergency Department
Admission: EM | Admit: 2023-03-13 | Discharge: 2023-03-13 | Disposition: A | Discharge status: Home / Self Care | Attending: Emergency Medicine | Admitting: Emergency Medicine

## 2023-03-13 ENCOUNTER — Ambulatory Visit: Payer: 59

## 2023-03-13 ENCOUNTER — Other Ambulatory Visit: Payer: Self-pay

## 2023-03-13 ENCOUNTER — Emergency Department

## 2023-03-13 ENCOUNTER — Encounter: Payer: Self-pay | Admitting: Emergency Medicine

## 2023-03-13 DIAGNOSIS — J441 Chronic obstructive pulmonary disease with (acute) exacerbation: Secondary | ICD-10-CM | POA: Insufficient documentation

## 2023-03-13 DIAGNOSIS — R0789 Other chest pain: Secondary | ICD-10-CM | POA: Diagnosis not present

## 2023-03-13 DIAGNOSIS — R0602 Shortness of breath: Secondary | ICD-10-CM | POA: Diagnosis not present

## 2023-03-13 DIAGNOSIS — R079 Chest pain, unspecified: Secondary | ICD-10-CM | POA: Diagnosis present

## 2023-03-13 LAB — CBC
HCT: 43.1 % (ref 36.0–46.0)
Hemoglobin: 14.1 g/dL (ref 12.0–15.0)
MCH: 31.5 pg (ref 26.0–34.0)
MCHC: 32.7 g/dL (ref 30.0–36.0)
MCV: 96.2 fL (ref 80.0–100.0)
Platelets: 123 10*3/uL — ABNORMAL LOW (ref 150–400)
RBC: 4.48 MIL/uL (ref 3.87–5.11)
RDW: 13.6 % (ref 11.5–15.5)
WBC: 4.4 10*3/uL (ref 4.0–10.5)
nRBC: 0 % (ref 0.0–0.2)

## 2023-03-13 LAB — RESP PANEL BY RT-PCR (RSV, FLU A&B, COVID)  RVPGX2
Influenza A by PCR: NEGATIVE
Influenza B by PCR: NEGATIVE
Resp Syncytial Virus by PCR: NEGATIVE
SARS Coronavirus 2 by RT PCR: NEGATIVE

## 2023-03-13 LAB — BASIC METABOLIC PANEL
Anion gap: 10 (ref 5–15)
BUN: 26 mg/dL — ABNORMAL HIGH (ref 6–20)
CO2: 23 mmol/L (ref 22–32)
Calcium: 9.1 mg/dL (ref 8.9–10.3)
Chloride: 102 mmol/L (ref 98–111)
Creatinine, Ser: 1.68 mg/dL — ABNORMAL HIGH (ref 0.44–1.00)
GFR, Estimated: 35 mL/min — ABNORMAL LOW (ref >60)
Glucose, Bld: 113 mg/dL — ABNORMAL HIGH (ref 70–99)
Potassium: 3.9 mmol/L (ref 3.5–5.1)
Sodium: 135 mmol/L (ref 135–145)

## 2023-03-13 LAB — TROPONIN I (HIGH SENSITIVITY)
Troponin I (High Sensitivity): 11 ng/L (ref <18)
Troponin I (High Sensitivity): 10 ng/L (ref <18)

## 2023-03-13 LAB — BRAIN NATRIURETIC PEPTIDE: B Natriuretic Peptide: 19.6 pg/mL (ref 0.0–100.0)

## 2023-03-13 MED ORDER — DEXAMETHASONE 6 MG PO TABS: 12.0000 mg | ORAL_TABLET | Freq: Once | ORAL | 0 refills | Status: AC

## 2023-03-13 MED ORDER — IPRATROPIUM-ALBUTEROL 0.5-2.5 (3) MG/3ML IN SOLN
3.0000 mL | Freq: Once | RESPIRATORY_TRACT | Status: AC
  Administered 2023-03-13: 3 mL via RESPIRATORY_TRACT
  Filled 2023-03-13: qty 6

## 2023-03-13 MED ORDER — METHYLPREDNISOLONE SODIUM SUCC 125 MG IJ SOLR
125.0000 mg | Freq: Once | INTRAMUSCULAR | Status: AC
  Administered 2023-03-13: 125 mg via INTRAVENOUS
  Filled 2023-03-13: qty 2

## 2023-03-13 MED ORDER — IPRATROPIUM-ALBUTEROL 0.5-2.5 (3) MG/3ML IN SOLN
3.0000 mL | Freq: Once | RESPIRATORY_TRACT | Status: AC
  Administered 2023-03-13: 3 mL via RESPIRATORY_TRACT

## 2023-03-13 NOTE — ED Provider Notes (Signed)
 [facility] Provider Note    Event Date/Time   First MD Initiated Contact with Patient 03/13/23 1236     (approximate)   History   Chest Pain and Shortness of Breath   HPI  Tammy Maxwell is a 63 y.o. female with a history of COPD, GERD, anemia, and arthritis who presents with chest pain for the last 2 days, described as a sensation of burning in both lungs, and associated denies any increasing cough.  She has had some alternating subjective fever and chills but her temperature at home was not elevated.  She does have some bodyaches.  She denies any vomiting or diarrhea.  I reviewed the past medical records per the patient's most recent outpatient encounter was on 2/21 at pulmonary rehab.  She has no recent ED visits or hospitalizations.   Physical Exam   Triage Vital Signs: ED Triage Vitals  Encounter Vitals Group     BP 03/13/23 1148 119/88     Systolic BP Percentile --      Diastolic BP Percentile --      Pulse Rate 03/13/23 1148 84     Resp 03/13/23 1148 18     Temp 03/13/23 1148 97.9 F (36.6 C)     Temp Source 03/13/23 1148 Oral     SpO2 03/13/23 1148 97 %     Weight 03/13/23 1145 109 lb (49.4 kg)     Height 03/13/23 1145 5' 8.5" (1.74 m)     Head Circumference --      Peak Flow --      Pain Score 03/13/23 1144 8     Pain Loc --      Pain Education --      Exclude from Growth Chart --     Most recent vital signs: Vitals:   03/13/23 1148  BP: 119/88  Pulse: 84  Resp: 18  Temp: 97.9 F (36.6 C)  SpO2: 97%     General: Awake, no distress.  CV:  Good peripheral perfusion.  Resp:  Normal effort.  Coarse breath sounds with some rhonchi bilaterally.  No wheezes or rales. Abd:  No distention.  Other:  No peripheral edema.   ED Results / Procedures / Treatments   Labs (all labs ordered are listed, but only abnormal results are displayed) Labs Reviewed  BASIC METABOLIC PANEL - Abnormal; Notable for the following components:       Result Value   Glucose, Bld 113 (*)    BUN 26 (*)    Creatinine, Ser 1.68 (*)    GFR, Estimated 35 (*)    All other components within normal limits  CBC - Abnormal; Notable for the following components:   Platelets 123 (*)    All other components within normal limits  RESP PANEL BY RT-PCR (RSV, FLU A&B, COVID)  [id number]  BRAIN NATRIURETIC PEPTIDE  TROPONIN I (HIGH SENSITIVITY)  TROPONIN I (HIGH SENSITIVITY)     EKG  ED ECG REPORT I, Dionne Bucy, the attending physician, personally viewed and interpreted this ECG.  Date: 03/13/2023 EKG Time: 1146 Rate: 79 Rhythm: normal sinus rhythm QRS Axis: normal Intervals: normal ST/T Wave abnormalities: Nonspecific ST abnormalities Narrative Interpretation: no evidence of acute ischemia    RADIOLOGY  Chest x-ray: I independently viewed and interpreted the images; there is no focal consolidation or edema.   PROCEDURES:  Critical Care performed: No  Procedures   MEDICATIONS ORDERED IN ED: Medications  ipratropium-albuterol (DUONEB) 0.5-2.5 (3) MG/3ML nebulizer solution 3  mL (3 mLs Nebulization Given 03/13/23 1316)  ipratropium-albuterol (DUONEB) 0.5-2.5 (3) MG/3ML nebulizer solution 3 mL (3 mLs Nebulization Given 03/13/23 1316)  methylPREDNISolone sodium succinate (SOLU-MEDROL) 125 mg/2 mL injection 125 mg (125 mg Intravenous Given 03/13/23 1315)     IMPRESSION / MDM / ASSESSMENT AND PLAN / ED COURSE  I reviewed the triage vital signs and the nursing notes.  63 year old female with PMH as noted above presents with increased shortness of breath and chest pain over the last couple of days along with some bodyaches.  On exam the patient is overall well-appearing.  Vital signs are normal.  She is not requiring supplemental oxygen.  Breath sounds are rhonchorous bilaterally.  There is no peripheral edema.  Differential diagnosis includes, but is not limited to, COPD exacerbation, influenza, RSV, COVID, other viral syndrome,  acute bronchitis, bacterial pneumonia, less likely CHF or ACS.  There is no clinical evidence for PE.  The patient has no tachycardia, hypoxia, or DVT symptoms.  There is no evidence of aortic dissection or other vascular etiology.  We will obtain chest x-ray, lab workup including viral panel, give bronchodilators, steroid, and reassess.  Patient's presentation is most consistent with acute complicated illness / injury requiring diagnostic workup.  ----------------------------------------- 3:18 PM on 03/13/2023 -----------------------------------------  Chest x-ray is clear.  Respiratory panel is negative.  Troponin is negative.  BNP is negative.  There is no leukocytosis.  Overall presentation is most consistent with COPD exacerbation.  I counseled the patient on the results of the workup.  The patient states that she reacts badly to prednisone so I prescribed her dexamethasone to take tomorrow.  She has albuterol at home and I instructed her on its use.  We will obtain a repeat troponin but if this is negative, the plan will be for discharge home.  The patient feels comfortable going home and is stable for discharge at this time.   FINAL CLINICAL IMPRESSION(S) / ED DIAGNOSES   Final diagnoses:  Atypical chest pain  COPD exacerbation (HCC)     Rx / DC Orders   ED Discharge Orders          Ordered    dexamethasone (DECADRON) 6 MG tablet   Once        03/13/23 1516             Note:  This document was prepared using Dragon voice recognition software and may include unintentional dictation errors.    Dionne Bucy, MD 03/13/23 1520

## 2023-03-13 NOTE — ED Provider Notes (Signed)
 Repeat troponin is normal.  Previously advised by Dr. Marisa Severin plan of care was patient ready, discharge instructions department  reviewed with her, and if her troponin is normal plan for discharge prescriptions already provided   Sharyn Creamer, MD 03/13/23 1635

## 2023-03-13 NOTE — Discharge Instructions (Signed)
 Use your albuterol via inhaler or nebulizer every 4-6 hours for the next several days.  Take the dexamethasone tomorrow for 1 dose.  This will work for few days and decrease inflammation in your lungs.  Follow-up with your primary care provider.  Return to the ER for new, worsening, or persistent severe chest pain, difficulty breathing, fever, weakness, or any other new or worsening symptoms that concern you.

## 2023-03-13 NOTE — ED Triage Notes (Signed)
 Pt via POV from home. Pt c/o SOB that started Monday and states that she started having generalized CP. Reports a productive cough. Denies any sick contacts but reports hx of COPD/CHF. Pt is A&Ox4 and NAD, ambulatory to triage.

## 2023-03-18 ENCOUNTER — Ambulatory Visit: Payer: 59

## 2023-03-20 ENCOUNTER — Ambulatory Visit: Payer: 59

## 2023-03-21 ENCOUNTER — Encounter: Payer: 59 | Admitting: Neurology

## 2023-03-25 ENCOUNTER — Ambulatory Visit: Payer: 59

## 2023-03-27 ENCOUNTER — Ambulatory Visit: Payer: 59

## 2023-03-28 ENCOUNTER — Encounter: Payer: 59 | Admitting: Neurology

## 2023-04-11 IMAGING — CR DG SHOULDER 2+V*L*
1 series · 3 of 3 positions shown · non-contrast
Comparison: None.

CLINICAL DATA: Recent fall.  Left shoulder pain

EXAM:
LEFT SHOULDER - 2+ VIEW

[Series 1: dg shoulder left · 0.14mm/px · 3 of 3 slices shown]
[im 1/3]
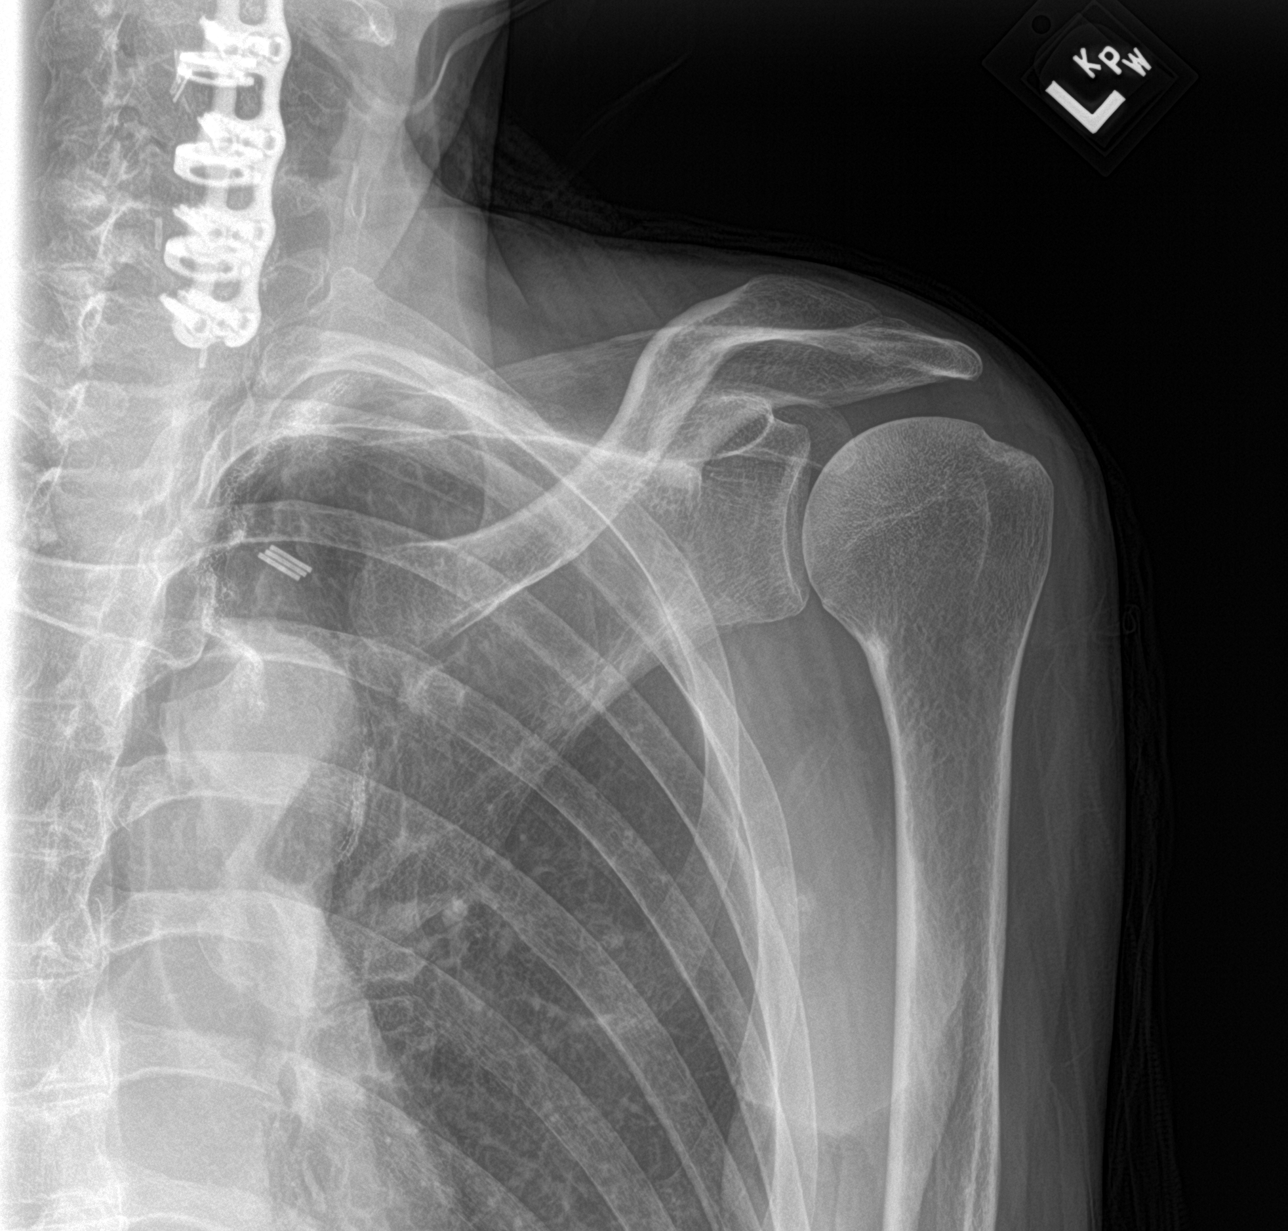
[im 2/3]
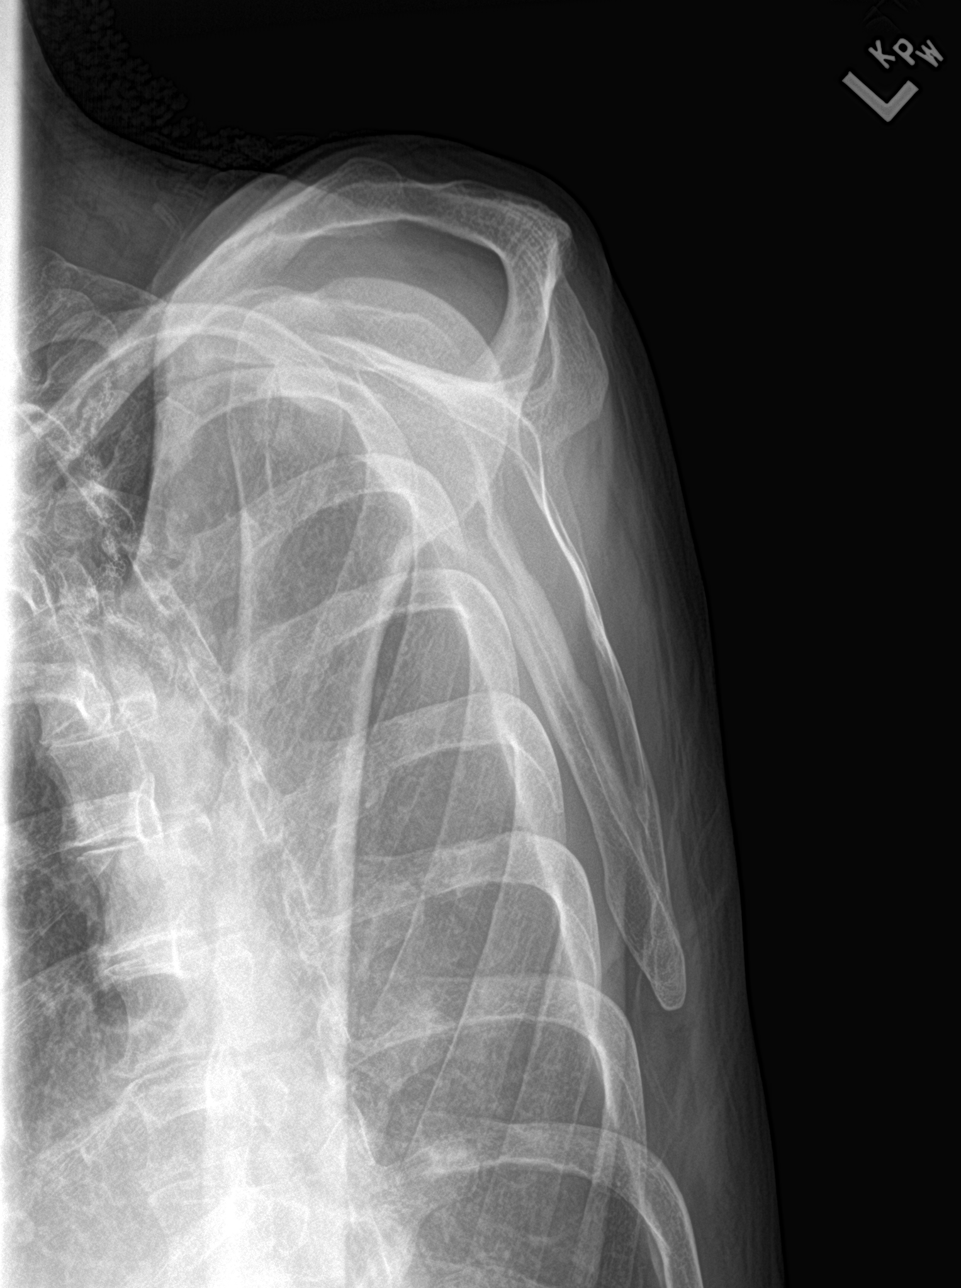
[im 3/3]
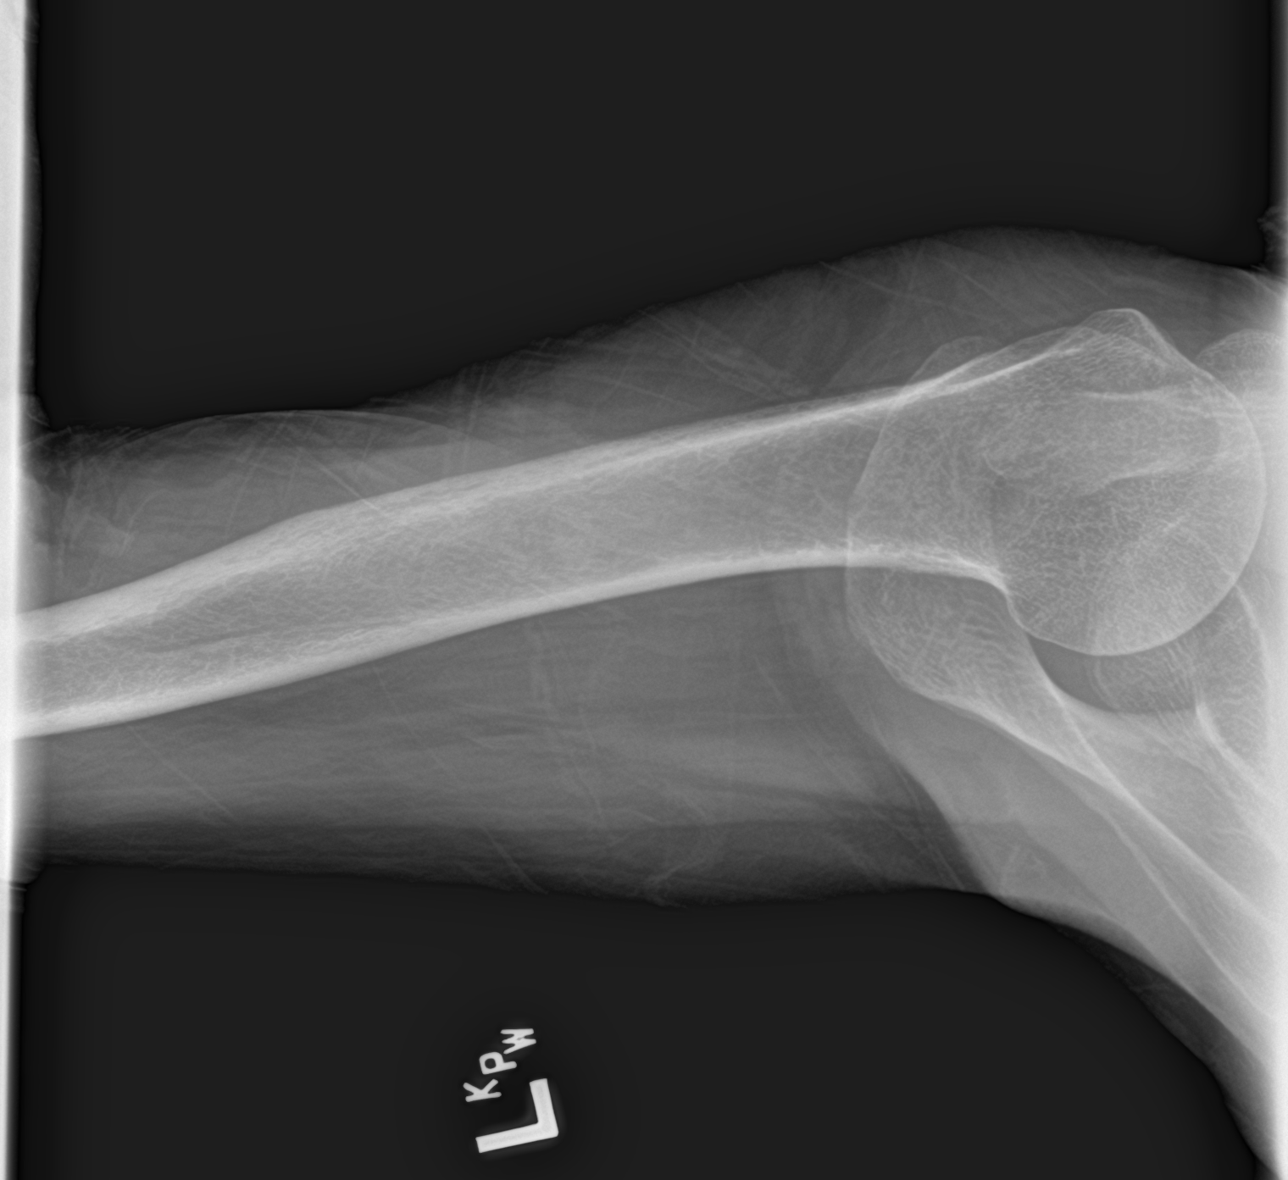

[3 of 3 positions shown; findings below may reference images not displayed]

FINDINGS: There is no evidence of fracture or dislocation. There is no
evidence of arthropathy or other focal bone abnormality. Soft
tissues are unremarkable.
IMPRESSION: Negative.

## 2023-04-11 IMAGING — CR DG CHEST 2V
1 series · 2 of 2 positions shown · non-contrast
Comparison: Radiograph 10/06/2017

CLINICAL DATA: Shortness of

EXAM:
CHEST - 2 VIEW

[Series 1: dg chest 2 view · 0.14mm/px · 2 of 2 slices shown]
[im 1/2]
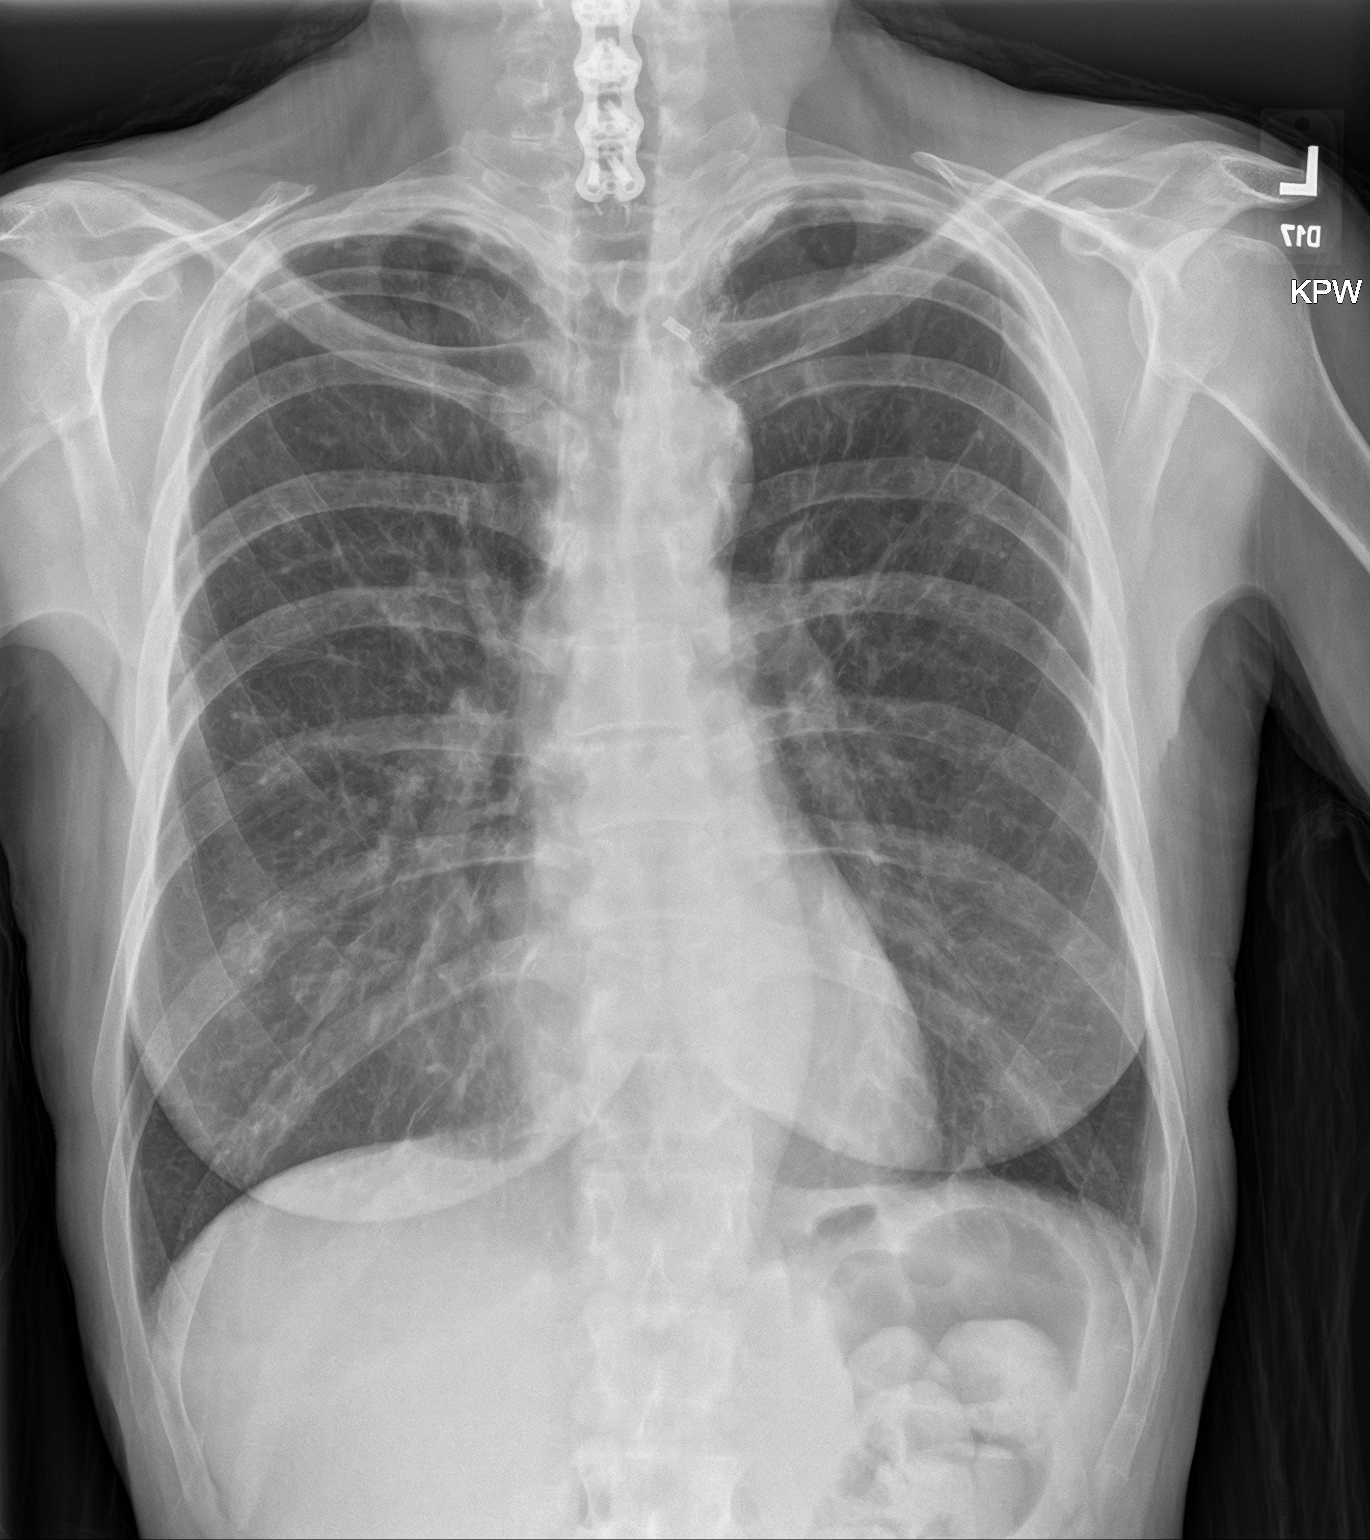
[im 2/2]
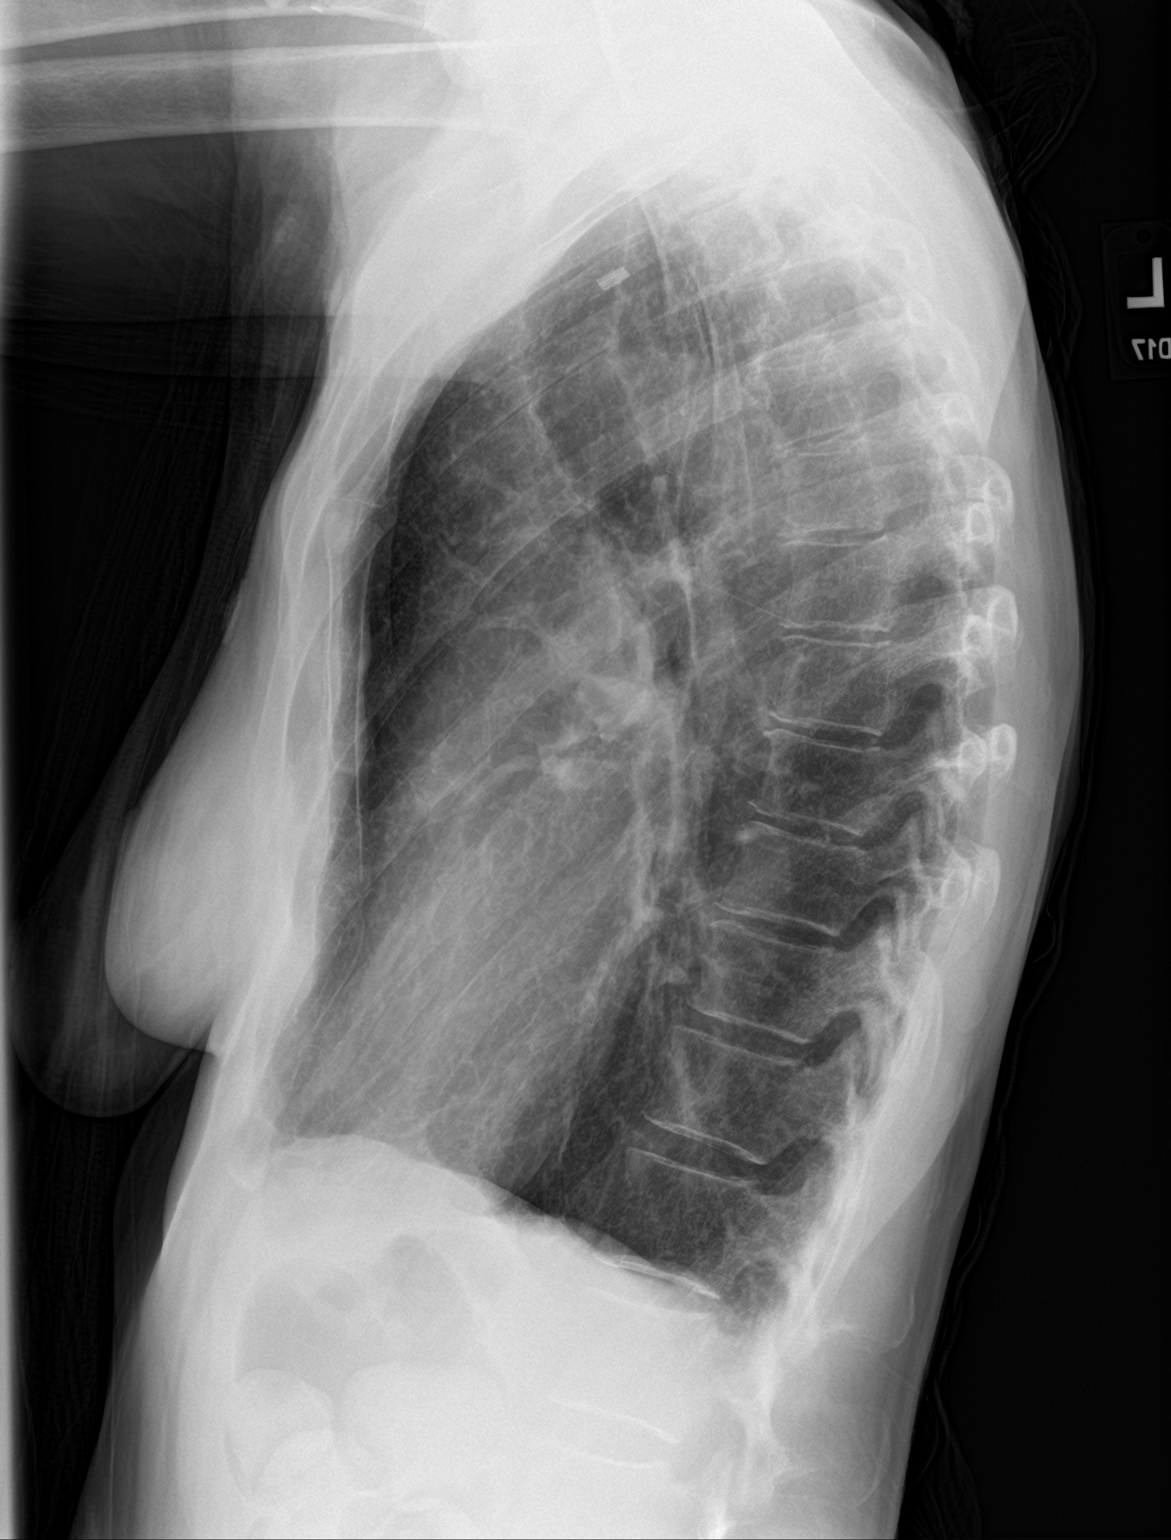

[2 of 2 positions shown; findings below may reference images not displayed]

FINDINGS: The cardiomediastinal silhouette is within normal limits. There is
no focal airspace consolidation. Pulmonary hyperinflation. Scattered
lung scarring. There is no large pleural effusion. There is no
visible pneumothorax. There is no acute osseous abnormality.
Cervical spine fusion hardware noted. Unchanged metallic clip
overlying the lower neck.
IMPRESSION: No evidence of acute cardiopulmonary disease.

## 2023-04-24 ENCOUNTER — Encounter: Payer: Self-pay | Admitting: Neurology

## 2023-04-24 ENCOUNTER — Encounter: Admitting: Neurology

## 2023-06-05 ENCOUNTER — Encounter: Payer: Self-pay | Admitting: Cardiology

## 2023-06-08 ENCOUNTER — Encounter: Payer: Self-pay | Admitting: Internal Medicine

## 2023-06-12 ENCOUNTER — Ambulatory Visit (INDEPENDENT_AMBULATORY_CARE_PROVIDER_SITE_OTHER): Admitting: Neurology

## 2023-06-12 DIAGNOSIS — G5603 Carpal tunnel syndrome, bilateral upper limbs: Secondary | ICD-10-CM

## 2023-06-12 DIAGNOSIS — G5623 Lesion of ulnar nerve, bilateral upper limbs: Secondary | ICD-10-CM

## 2023-06-12 DIAGNOSIS — R202 Paresthesia of skin: Secondary | ICD-10-CM

## 2023-06-12 NOTE — Procedures (Signed)
 [facility]  [address], [address]  [place], [place] [address] Tel: [phone] Fax: [phone] Test Date:  06/12/2023  Patient: Tammy Maxwell DOB: February 11, 1960 Physician: Reyna Cava, DO  Sex: Female Height: 5\' 8"  Ref Phys: Loralyn Rochester, MD  ID#: [id number]   Technician:    History: This is a 63 year old female referred for evaluation of bilateral upper extremity pain and paresthesias.  NCV & EMG Findings: Extensive electrodiagnostic testing of the right upper extremity and additional studies of the left shows:  Bilateral median and left ulnar sensory responses show prolonged latency  (R3.9, L4.2, L3.5 ms).  Right ulnar sensory response is absent.  Bilateral radial sensory responses are within normal limits. Right median motor response is within normal limits.  Left median motor response shows prolonged latency (4.3 ms) and reduced amplitude (3.9 mV).  Of note, there is evidence of a left Martin-Gruber anastomoses, a normal anatomic variant.  Right ulnar motor response shows prolonged latency (3.8 ms), reduced amplitude (5.4 mV), and decreased conduction velocity (A Elbow-B Elbow, 22 m/s).  Left ulnar motor response shows slowed conduction velocity across the elbow (A Elbow-B Elbow, 38 m/s).  Chronic motor axonal loss changes are seen affecting bilateral ulnar innervated muscles, which is worse on the right as well as the left abductor pollicis brevis muscle.  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 Bilateral median neuropathy at or distal to the wrist, consistent with a clinical diagnosis of carpal tunnel syndrome.  Overall, these findings are mild on the right, and moderate-to-severe on the left.   ___________________________ Reyna Cava, DO    Nerve Conduction Studies   Stim Site NR Peak (ms) Norm Peak (ms) O-P Amp (V)  Norm O-P Amp  Left Median Anti Sensory (2nd Digit)  32 C  Wrist    *4.2 <3.8 37.2 >10  Right Median Anti Sensory (2nd Digit)  32 C  Wrist    *3.9 <3.8 24.6 >10  Left Radial Anti Sensory (Base 1st Digit)  32 C  Wrist    2.3 <2.8 18.3 >10  Right Radial Anti Sensory (Base 1st Digit)  32 C  Wrist    2.5 <2.8 22.3 >10  Left Ulnar Anti Sensory (5th Digit)  32 C  Wrist    *3.5 <3.2 12.4 >5  Right Ulnar Anti Sensory (5th Digit)  32 C  Wrist *NR  <3.2  >5     Stim Site NR Onset (ms) Norm Onset (ms) O-P Amp (mV) Norm O-P Amp Site1 Site2 Delta-0 (ms) Dist (cm) Vel (m/s) Norm Vel (m/s)  Left Median Motor (Abd Poll Brev)  32 C  Wrist    *4.3 <4.0 *3.9 >5 Elbow Wrist 5.9 30.0 51 >50  Elbow    10.2  5.2  Ulnar-wrist crossover Elbow 5.3 0.0    Ulnar-wrist crossover    4.9  4.4         Right Median Motor (Abd Poll Brev)  32 C  Wrist    3.9 <4.0 10.7 >5 Elbow Wrist 5.9 30.0 51 >50  Elbow    9.8  9.7         Left Ulnar Motor (Abd Dig Minimi)  32 C  Wrist    2.9 <3.1 9.3 >7 B Elbow Wrist 3.7 20.0 54 >50  B Elbow    6.6  7.9  A Elbow B Elbow 2.6 10.0 *38 >50  A Elbow  9.2  7.7         Right Ulnar Motor (Abd Dig Minimi)  32 C  Wrist    *3.8 <3.1 *5.4 >7 B Elbow Wrist 4.0 21.0 53 >50  B Elbow    7.8  4.7  A Elbow B Elbow 4.6 10.0 *22 >50  A Elbow    12.4  2.9          Electromyography   Side Muscle Ins.Act Fibs Fasc Recrt Amp Dur Poly Activation Comment  Right 1stDorInt Nml Nml Nml *2- *1+ *1+ *1+ Nml N/A  Right Abd Poll Brev Nml Nml Nml Nml Nml Nml Nml Nml N/A  Right PronatorTeres Nml Nml Nml Nml Nml Nml Nml Nml N/A  Right Biceps Nml Nml Nml Nml Nml Nml Nml Nml N/A  Right Triceps Nml Nml Nml Nml Nml Nml Nml Nml N/A  Right Deltoid Nml Nml Nml Nml Nml Nml Nml Nml N/A  Right Abd Dig Min Nml Nml Nml *2- *1+ *1+ *1+ Nml N/A  Right FlexCarpiUln Nml Nml Nml *2- *1+ *1+ *1+ Nml N/A  Left 1stDorInt Nml Nml Nml *1- *1+ *1+ *1+ Nml N/A  Left Abd Poll Brev Nml Nml Nml *SMU *1+ *1+ *1+ Nml *ATR   Left PronatorTeres Nml Nml Nml Nml Nml Nml Nml Nml N/A  Left Biceps Nml Nml Nml Nml Nml Nml Nml Nml N/A  Left Triceps Nml Nml Nml Nml Nml Nml Nml Nml N/A  Left Deltoid Nml Nml Nml Nml Nml Nml Nml Nml N/A  Left Abd Dig Min Nml Nml Nml *1- *1+ *1+ *1+ Nml N/A  Left FlexCarpiUln Nml Nml Nml Nml Nml Nml Nml Nml N/A      Waveforms:

## 2023-06-19 ENCOUNTER — Ambulatory Visit: Payer: 59 | Admitting: Student in an Organized Health Care Education/Training Program

## 2023-07-08 ENCOUNTER — Telehealth: Payer: Self-pay | Admitting: Neurology

## 2023-07-08 NOTE — Telephone Encounter (Signed)
 Pt states that Dr Myla Meadows has not gotten the results of the EMG and she would like us  to resend it to them. She is wanting to get the test results

## 2023-07-08 NOTE — Telephone Encounter (Signed)
 Emg results refaxed to Dr. Landrum

## 2023-07-26 IMAGING — DX DG ANKLE COMPLETE 3+V*L*
3 series · 3 of 3 positions shown · non-contrast
Comparison: None Available.

CLINICAL DATA: Pt fell on [REDACTED] and hurt her left ankle. Pt states
that the swelling had gotten a little better but she still has
swelling and pain in her ankle. Bruising noted to dorsum of 4th and
5th metatarsal.

EXAM:
LEFT ANKLE COMPLETE - 3+ VIEW

[ankle ap]
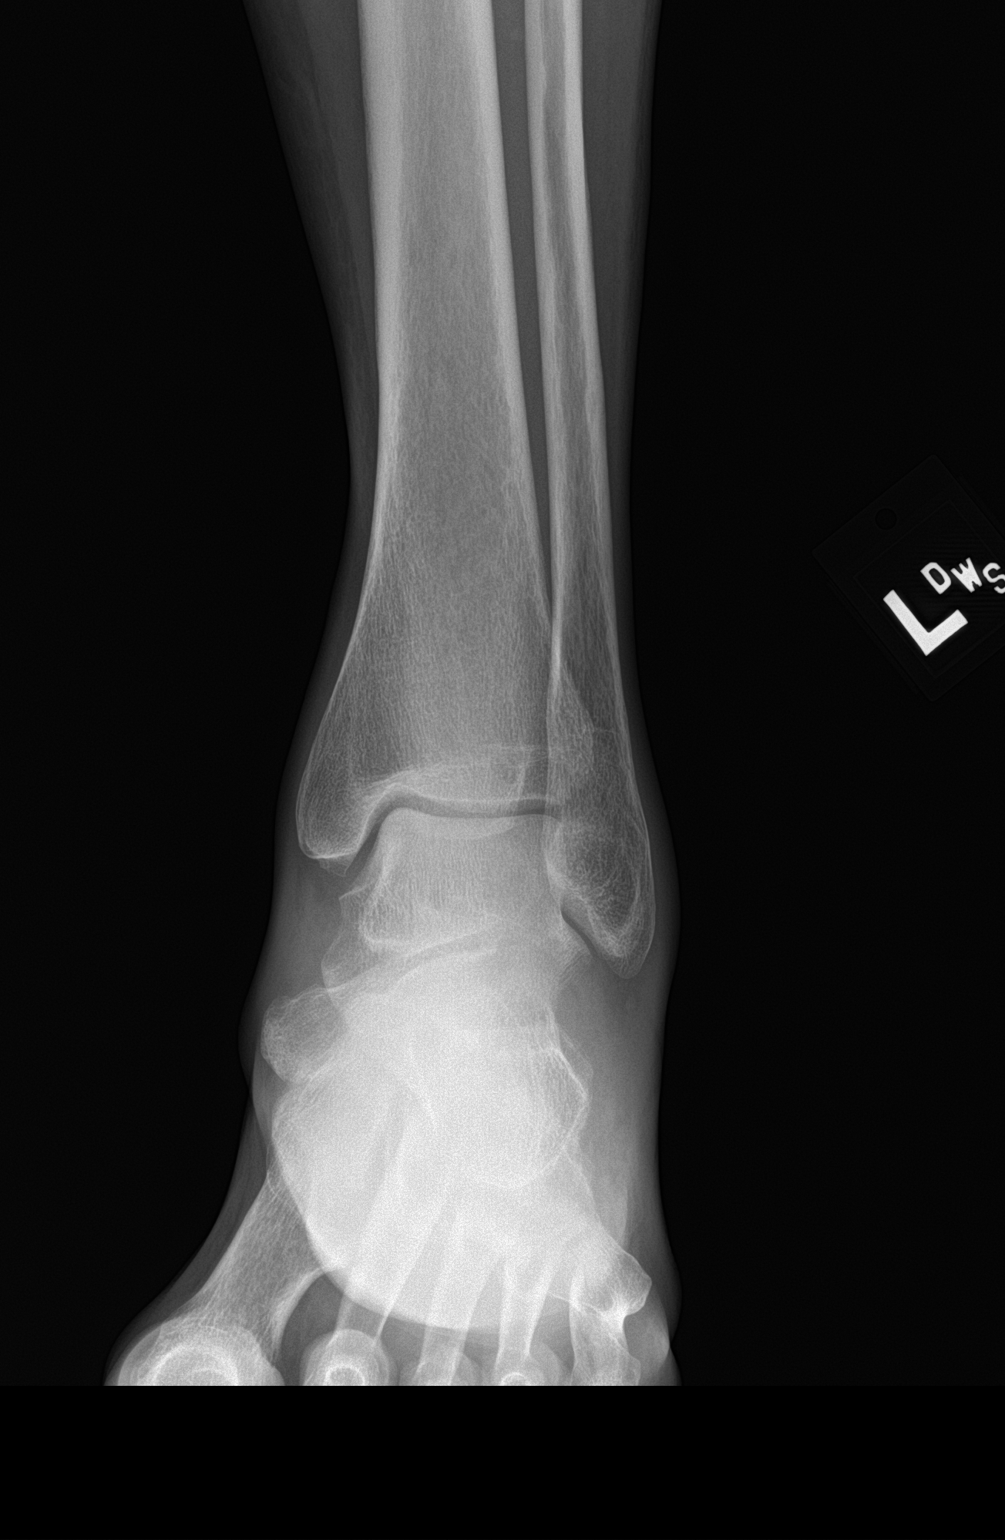

[ankle obl]
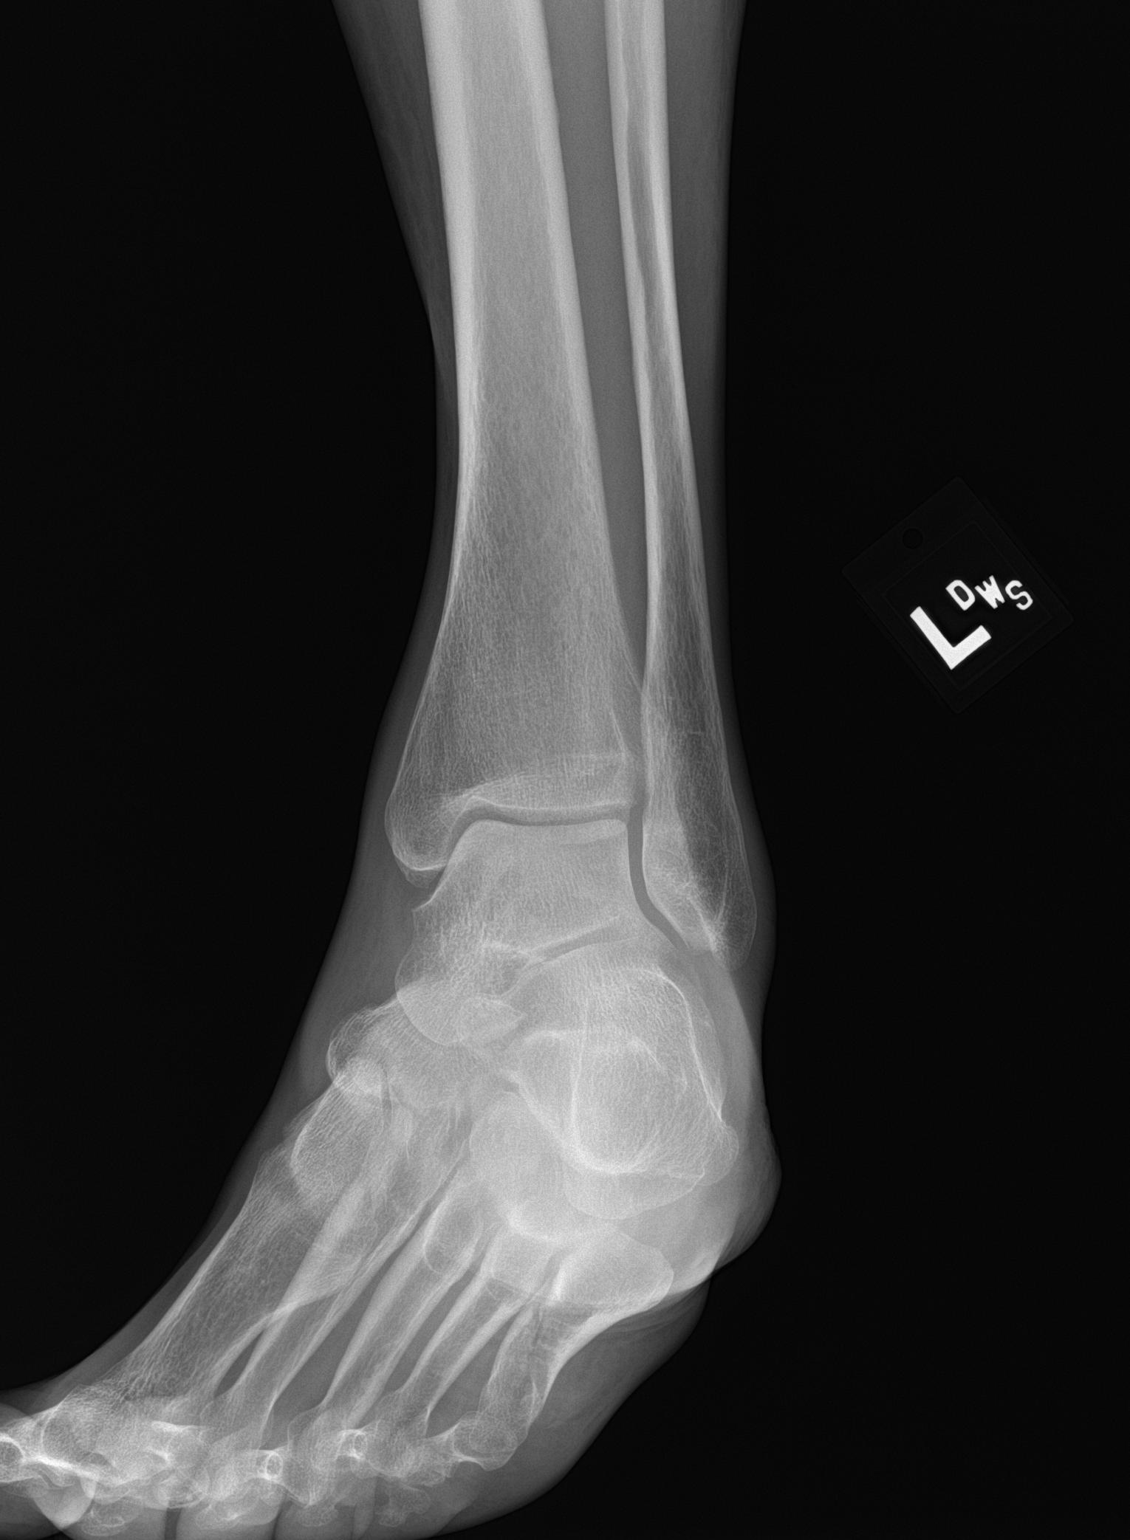

[ankle lat]
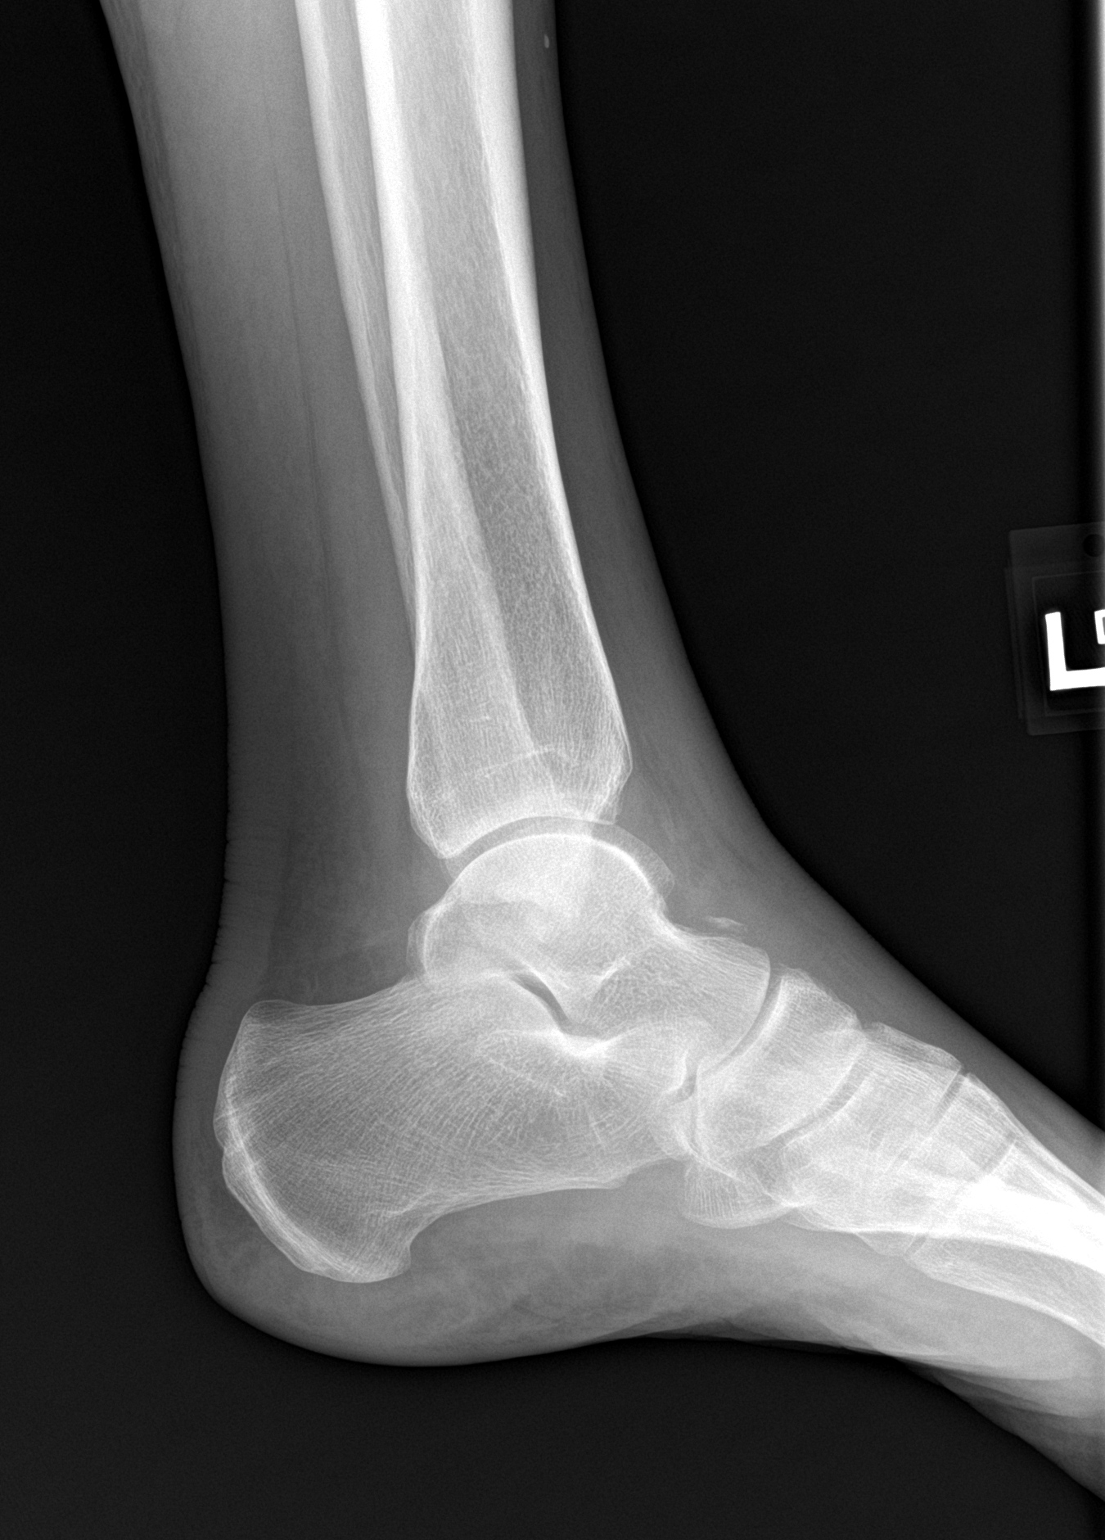

[3 of 3 positions shown; findings below may reference images not displayed]

FINDINGS: Small avulsion fracture from the dorsal anterior aspect of the talus
with mild associated soft tissue swelling.

No other fractures. Ankle joint is normally spaced and aligned. No
arthropathic changes.
IMPRESSION: 1. Small avulsion fracture from the dorsal anterior aspect of the
talus. No other fractures. Normal alignment of the ankle joint.

## 2023-07-26 IMAGING — DX DG FOOT COMPLETE 3+V*L*
3 series · 3 of 3 positions shown · non-contrast
Comparison: None Available.

CLINICAL DATA: Fell on [REDACTED] injuring LEFT ankle, persistent
swelling and pain, bruising dorsal to the fourth and fifth
metatarsals

EXAM:
LEFT FOOT - COMPLETE 3+ VIEW

[foot ap]
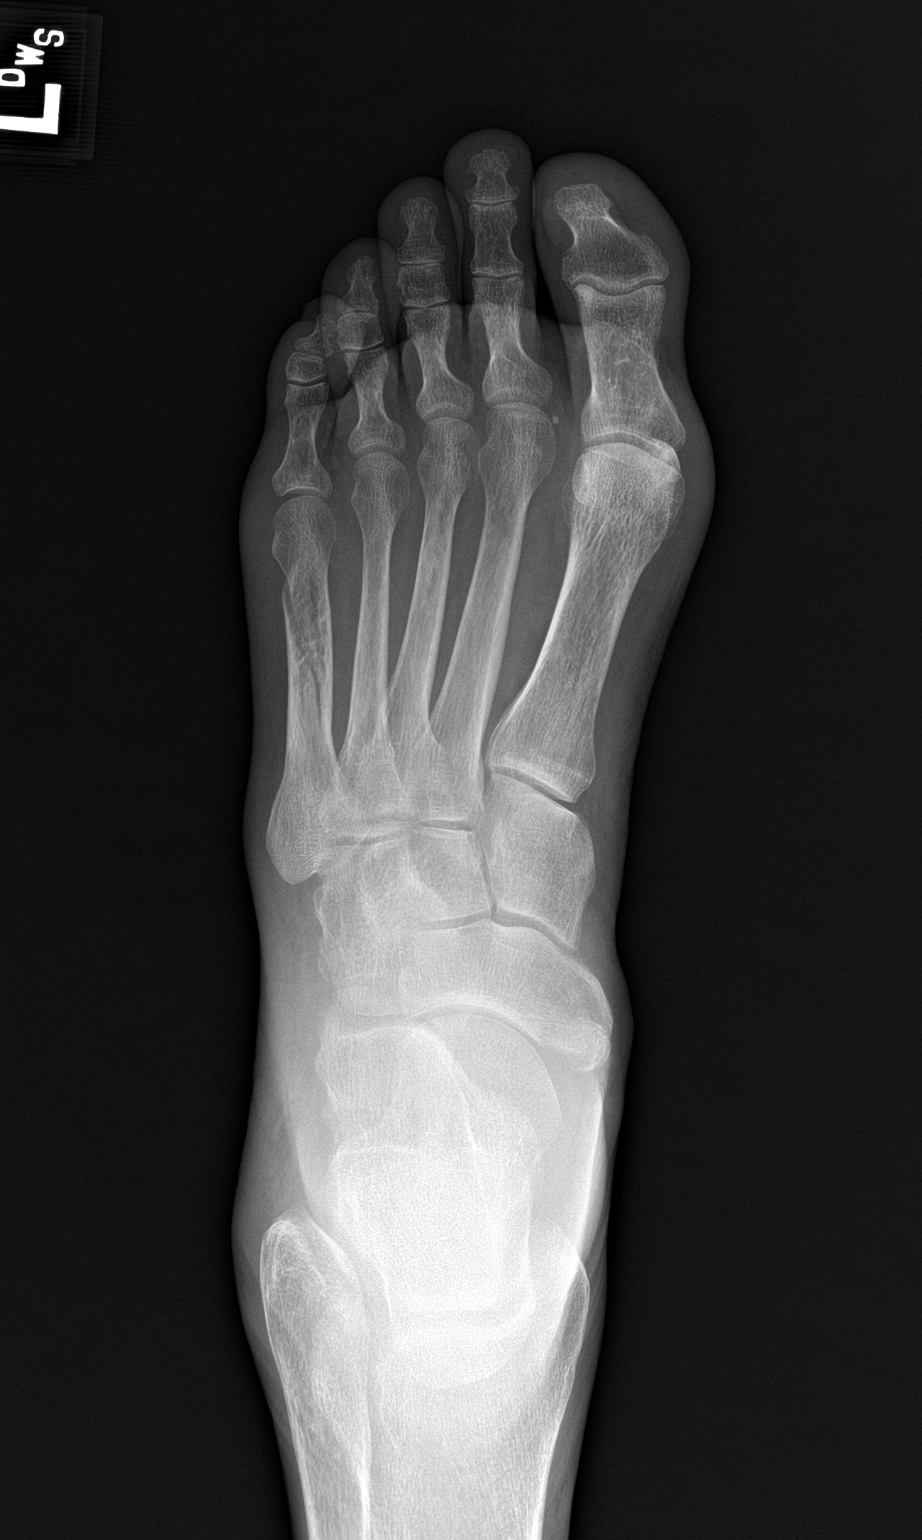

[foot obl]
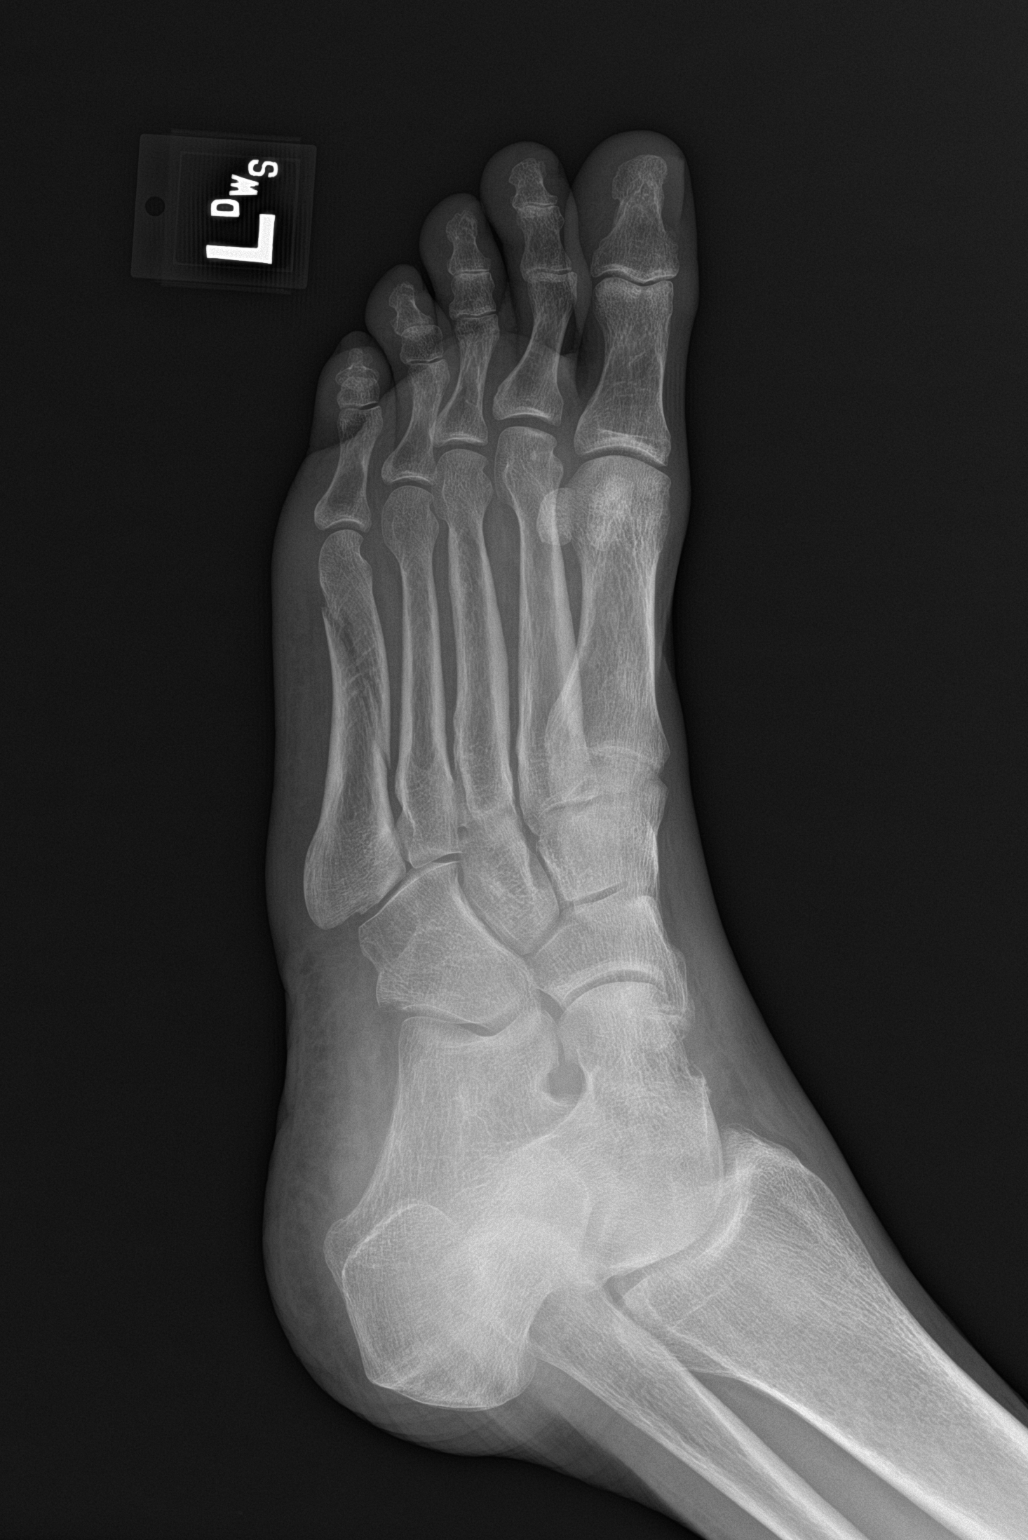

[foot lat]
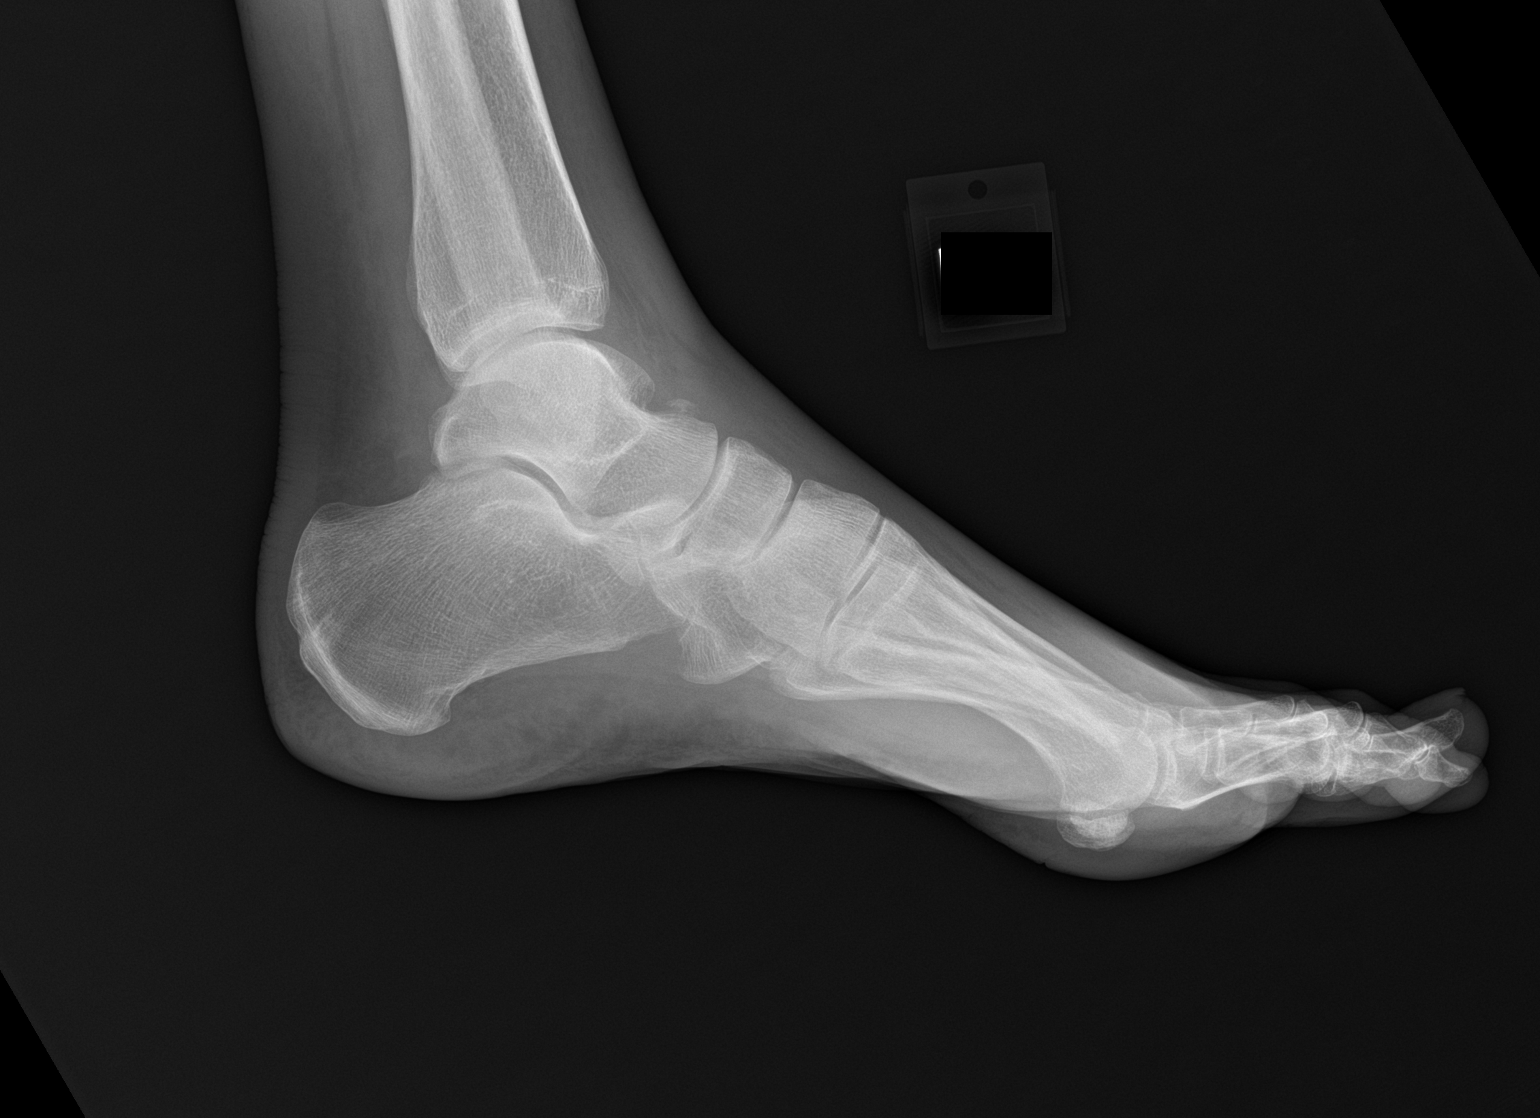

[3 of 3 positions shown; findings below may reference images not displayed]

FINDINGS: Osseous demineralization.

Joint spaces preserved.

Oblique essentially nondisplaced fracture involving fifth metatarsal
diaphysis.

Additional os effect density at the dorsal margin of the distal
talus consistent with age-indeterminate avulsion fracture, no
definite donor site seen.

No additional fracture, dislocation, or bone destruction.
IMPRESSION: Essentially nondisplaced oblique fracture of fifth metatarsal
diaphysis.

Age-indeterminate avulsion fracture at dorsal margin of distal
talus; recommend correlation for pain/tenderness at this site.

## 2023-08-08 ENCOUNTER — Telehealth: Payer: Self-pay | Admitting: Neurology

## 2023-08-08 NOTE — Telephone Encounter (Signed)
 Pt called to get results of her EMG. She goes to her doctor Monday and wants to make sure they have it

## 2023-08-08 NOTE — Telephone Encounter (Signed)
 I contacted patient results were sent to ordering provider, patient thanked me for calling.

## 2023-08-12 ENCOUNTER — Telehealth: Payer: Self-pay

## 2023-08-12 NOTE — Telephone Encounter (Signed)
 Copied from CRM #[id number]. Topic: Clinical - Order For Equipment >> Aug 12, 2023  4:23 PM Leila BROCKS wrote: Reason for CRM: Patient [phone] is requesting a portable rechargable oxygen concentrator order to [employer] [phone]. Please advise and call back.

## 2023-08-12 NOTE — Telephone Encounter (Signed)
 I spoke with the patient and scheduled her an appt with Dr. Isadora for a follow up and to qualify her for a POC. Appt scheduled for 8/7 at 2:30pm.  Nothing further needed.

## 2023-08-14 ENCOUNTER — Ambulatory Visit (INDEPENDENT_AMBULATORY_CARE_PROVIDER_SITE_OTHER): Admitting: Student in an Organized Health Care Education/Training Program

## 2023-08-14 ENCOUNTER — Telehealth: Payer: Self-pay

## 2023-08-14 ENCOUNTER — Encounter: Payer: Self-pay | Admitting: Student in an Organized Health Care Education/Training Program

## 2023-08-14 VITALS — BP 110/72 | HR 86 | Temp 97.1°F | Ht 68.5 in | Wt 110.6 lb

## 2023-08-14 DIAGNOSIS — F17210 Nicotine dependence, cigarettes, uncomplicated: Secondary | ICD-10-CM | POA: Diagnosis not present

## 2023-08-14 DIAGNOSIS — J4540 Moderate persistent asthma, uncomplicated: Secondary | ICD-10-CM | POA: Diagnosis not present

## 2023-08-14 DIAGNOSIS — J432 Centrilobular emphysema: Secondary | ICD-10-CM

## 2023-08-14 DIAGNOSIS — Z87891 Personal history of nicotine dependence: Secondary | ICD-10-CM

## 2023-08-14 DIAGNOSIS — J9611 Chronic respiratory failure with hypoxia: Secondary | ICD-10-CM

## 2023-08-14 MED ORDER — ENSIFENTRINE 3 MG/2.5ML IN SUSP: 3.0000 mg | Freq: Two times a day (BID) | RESPIRATORY_TRACT | Status: AC

## 2023-08-14 NOTE — Telephone Encounter (Signed)
 Pt seen in clinic today and was started on Ohtuvarye. Paperwork has been filled out and will be faxed to pharmacy team.

## 2023-08-14 NOTE — Telephone Encounter (Signed)
 Received via Onbase. Will complete and submit to [facility]

## 2023-08-14 NOTE — Progress Notes (Signed)
 Assessment & Plan:   #Chronic hypoxic respiratory failure (HCC) #Centrilobular emphysema (HCC) (Primary) #Asthma/COPD overlap  Presenting for follow up of shortness of breath in the setting of known emphysema. She's been maintained on Symbicort and Spiriva  Respimat with good effect, but continues to have symptoms. PFT's previously obtained were consistent with PRISM  (normal ratio but spirometry was impaired with a significant bronchodilator response). TLC was within normal, and DLCO was severely depressed at 44%. This is consistent with Asthma/COPD overlap. She is currently on LABA/LAMA/ICS (high dose advair) with some improvement in symptoms.  Today, she trended on room air and her oxygen levels dropped. She would benefit from a pulse dose oxygen concentrator to help with her mobility and activity. I will also obtain an echocardiogram to rule out pulmonary hypertension. Finally, I will initiate her on Ensifentrine  for step up in therapy to help with symptoms.  - ECHOCARDIOGRAM COMPLETE; Future - Ensifentrine  3 MG/2.5ML SUSP; Inhale 3 mg into the lungs in the morning and at bedtime. -vaccinations reviewed, she's had her flu and PCV vaccines, recommend RSV and flu next year  #Tobacco Use Disorder  Discussed importance of smoking cessation. Patient counseled regarding importance of quitting.  Return in about 6 months (around 02/14/2024).  I spent 34 minutes caring for this patient today, including preparing to see the patient, obtaining a medical history , reviewing a separately obtained history, performing a medically appropriate examination and/or evaluation, counseling and educating the patient/family/caregiver, ordering medications, tests, or procedures, documenting clinical information in the electronic health record, and independently interpreting results (not separately reported/billed) and communicating results to the patient/family/caregiver. 3 minutes utilized to counsel the patient on  the importance of smoking cessation.  Belva November, MD [facility]  End of visit medications:  Meds ordered this encounter  Medications   Ensifentrine  3 MG/2.5ML SUSP    Sig: Inhale 3 mg into the lungs in the morning and at bedtime.     Current Outpatient Medications:    albuterol  (PROVENTIL ) (2.5 MG/3ML) 0.083% nebulizer solution, Take 2.5 mg by nebulization 4 (four) times daily., Disp: , Rfl:    aspirin 81 MG chewable tablet, Chew 81 mg by mouth daily., Disp: , Rfl:    buPROPion (WELLBUTRIN SR) 150 MG 12 hr tablet, Take 150 mg by mouth 2 (two) times daily., Disp: , Rfl:    clonazePAM (KLONOPIN) 1 MG tablet, Take 1 mg by mouth at bedtime., Disp: , Rfl:    cyanocobalamin  (VITAMIN B12) 500 MCG tablet, Take 500 mcg by mouth daily., Disp: , Rfl:    Ensifentrine  3 MG/2.5ML SUSP, Inhale 3 mg into the lungs in the morning and at bedtime., Disp: , Rfl:    ferrous sulfate 324 MG TBEC, Take 324 mg by mouth., Disp: , Rfl:    fluticasone -salmeterol (ADVAIR HFA) 230-21 MCG/ACT inhaler, Inhale 2 puffs into the lungs 2 (two) times daily., Disp: 1 each, Rfl: 12   levothyroxine (SYNTHROID) 50 MCG tablet, Take 50 mcg by mouth daily., Disp: , Rfl:    meloxicam  (MOBIC ) 15 MG tablet, Take 15 mg by mouth at bedtime., Disp: , Rfl:    naloxone (NARCAN) nasal spray 4 mg/0.1 mL, SMARTSIG:Both Nares, Disp: , Rfl:    nicotine  (NICODERM CQ  - DOSED IN MG/24 HOURS) 14 mg/24hr patch, Place 14 mg onto the skin daily., Disp: , Rfl:    nitroGLYCERIN  (NITROSTAT ) 0.3 MG SL tablet, Place 0.3 mg under the tongue every 5 (five) minutes as needed for chest pain., Disp: , Rfl:  omeprazole (PRILOSEC) 40 MG capsule, Take 40 mg by mouth 2 (two) times daily., Disp: , Rfl:    PARoxetine (PAXIL) 20 MG tablet, Take 20 mg by mouth at bedtime., Disp: , Rfl:    pravastatin (PRAVACHOL) 10 MG tablet, Take 10 mg by mouth daily., Disp: , Rfl:    QUEtiapine (SEROQUEL) 400 MG tablet, Take 400 mg by mouth at bedtime.,  Disp: , Rfl:    sucralfate (CARAFATE) 1 GM/10ML suspension, Take 2 g by mouth 3 times/day as needed-between meals & bedtime., Disp: , Rfl:    SUMAtriptan (IMITREX) 100 MG tablet, Take 100 mg by mouth every 2 (two) hours as needed for migraine. May repeat in 2 hours if headache persists or recurs., Disp: , Rfl:    Tiotropium Bromide Monohydrate  (SPIRIVA  RESPIMAT) 2.5 MCG/ACT AERS, Inhale 2 puffs into the lungs daily., Disp: 4 g, Rfl: 11   traMADol (ULTRAM) 50 MG tablet, Take 50 mg by mouth 4 (four) times daily., Disp: , Rfl:  No current facility-administered medications for this visit.  Facility-Administered Medications Ordered in Other Visits:    albuterol  (PROVENTIL ) (2.5 MG/3ML) 0.083% nebulizer solution 2.5 mg, 2.5 mg, Nebulization, Once, Isadora Hose, MD   Subjective:   PATIENT ID: Tammy Maxwell GENDER: female DOB: June 27, 1960, MRN: [id number]  Chief Complaint  Patient presents with   Follow-up    Increased SOB. Wheezing. Cough with white sputum.     HPI  Patient is a pleasant 63 year old female presenting for follow up.  Return Visit 12/27/2022:   Patient was last seen in clinic in September of 2024 where we had her on triple therapy with Symbicort and Spiriva . She had her PFT's and is here to discuss results. She continue to smoke one cigarette a day but is interested in full smoking cessation. She tells me she's had asthma growing up and did have a wheeze as a child and young adult. She's enrolled in our lung cancer screening program.   She had initially reported symptoms since 1995. She reports having had surgery in 1995 where a part of my lung was removed. She reports this was done because of recurrent collapse of the lung. She reports progressive dyspnea over the years, and was told she had COPD in the past. Patient's symptoms include a cough that is productive of brownish sputum at times, dyspnea with exertion (especially going up stairs), and wheezing. She doesn't report  any recent exacerbations requiring steroids.   Patient has been seen by cardiology for chest tightness and shortness of breath, and workup has included a coronary CTA and echocardiogram. She is enrolled in our lung cancer screening program with her last CT being from July of 2023. She missed her LDCT in September of 2024.  Return Visit 08/14/2023:  Presents for follow up today, with persistent dyspnea on exertion as well as cough. She has oxygen tanks but she is not able to be mobile with these. She is compliant with her inhaler therapy. She continues to smoke a few cigarettes a day. She has not had any exacerbations since our last visit. She did work with pulmonary rehab but this had to be put on hold given lower extremity pain.   Patient previously lived in [place], [place]. She used to work as a Health visitor (plumbing, Lobbyist, Event organiser). Denies asbestos exposure. She used to be a heavy smoker and has since cut down to a few cigarettes a day.  Ancillary information including prior medications, full medical/surgical/family/social histories, and PFTs (when available) are listed  below and have been reviewed.   Review of Systems  Constitutional:  Positive for weight loss. Negative for chills, fever and malaise/fatigue.  Respiratory:  Positive for shortness of breath. Negative for cough, hemoptysis, sputum production and wheezing.   Cardiovascular:  Negative for chest pain and palpitations.     Objective:   Vitals:   08/14/23 1437  BP: 110/72  Pulse: 86  Temp: (!) 97.1 F (36.2 C)  SpO2: 97%  Weight: 110 lb 9.6 oz (50.2 kg)  Height: 5' 8.5 (1.74 m)   97% on RA, Hypoxic to 86% with ambulation on room air  BMI Readings from Last 3 Encounters:  08/14/23 16.57 kg/m  03/13/23 16.33 kg/m  12/27/22 17.30 kg/m   Wt Readings from Last 3 Encounters:  08/14/23 110 lb 9.6 oz (50.2 kg)  03/13/23 109 lb (49.4 kg)  12/27/22 113 lb 12.8 oz (51.6 kg)    Physical Exam Constitutional:       Appearance: Normal appearance. She is not ill-appearing.  Cardiovascular:     Rate and Rhythm: Normal rate and regular rhythm.     Pulses: Normal pulses.     Heart sounds: Normal heart sounds.  Pulmonary:     Breath sounds: No wheezing.     Comments: Decreased air entry in the bilateral lung field Abdominal:     Palpations: Abdomen is soft.  Neurological:     General: No focal deficit present.     Mental Status: She is alert and oriented to person, place, and time. Mental status is at baseline.       Ancillary Information    Past Medical History:  Diagnosis Date   Anemia    Arthritis    Bronchitis    Chest pain    COPD (chronic obstructive pulmonary disease) (HCC)    Depression    Dysrhythmia    GERD (gastroesophageal reflux disease)    Headache    Heart murmur    History of hiatal hernia    Pneumonia    Pneumothorax    Wears dentures    full upper     Family History  Problem Relation Age of Onset   Stroke Mother    Hypertension Mother    Diabetes Mother    Clotting disorder Mother    Cancer Mother    Anesthesia problems Mother    Stroke Father    Hypertension Father    Diabetes Father    Cancer Father    Clotting disorder Father    Anesthesia problems Father    Hypertension Sister    Diabetes Sister    Hypertension Brother    Parkinson's disease Maternal Grandfather      Past Surgical History:  Procedure Laterality Date   APPENDECTOMY     CATARACT EXTRACTION W/PHACO Right 08/19/2019   Procedure: CATARACT EXTRACTION PHACO AND INTRAOCULAR LENS PLACEMENT (IOC) RIGHT;  Surgeon: Myrna Adine Anes, MD;  Location: [facility] ORS;  Service: Ophthalmology;  Laterality: Right;  US  00:14.5 CDE 0.90 Fluid Pack lot # [id number]   CATARACT EXTRACTION W/PHACO Left 09/20/2019   Procedure: CATARACT EXTRACTION PHACO AND INTRAOCULAR LENS PLACEMENT (IOC) LEFT 1.27  00:13.7;  Surgeon: Myrna Adine Anes, MD;  Location: [facility];  Service: Ophthalmology;  Laterality:  Left;   COLONOSCOPY     COLONOSCOPY WITH PROPOFOL  N/A 02/15/2020   Procedure: COLONOSCOPY WITH PROPOFOL ;  Surgeon: Therisa Bi, MD;  Location: [facility] END[facility];  Service: Gastroenterology;  Laterality: N/A;   ESOPHAGOGASTRODUODENOSCOPY (EGD) WITH PROPOFOL  N/A 02/15/2020   Procedure:  ESOPHAGOGASTRODUODENOSCOPY (EGD) WITH PROPOFOL ;  Surgeon: Therisa Bi, MD;  Location: [facility] ENDOSCOPY;  Service: Gastroenterology;  Laterality: N/A;   LUNG LOBECTOMY Left    OOPHORECTOMY     ROTATOR CUFF REPAIR     SHOULDER ARTHROSCOPY Right     Social History   Socioeconomic History   Marital status: Single    Spouse name: Not on file   Number of children: Not on file   Years of education: Not on file   Highest education level: Not on file  Occupational History   Not on file  Tobacco Use   Smoking status: Every Day    Current packs/day: 2.00    Average packs/day: 2.0 packs/day for 49.6 years (99.2 ttl pk-yrs)    Types: Cigarettes    Start date: 1976   Smokeless tobacco: Never   Tobacco comments:    3 cigarettes a day- khj 08/14/2023  Vaping Use   Vaping status: Never Used  Substance and Sexual Activity   Alcohol use: Not Currently   Drug use: Not Currently   Sexual activity: Not Currently  Other Topics Concern   Not on file  Social History Narrative   Not on file   Social Drivers of Health   Financial Resource Strain: Not on file  Food Insecurity: Food Insecurity Present (03/18/2022)   Hunger Vital Sign    Worried About Running Out of Food in the Last Year: Sometimes true    Ran Out of Food in the Last Year: Sometimes true  Transportation Needs: No Transportation Needs (03/18/2022)   PRAPARE - Administrator, Civil Service (Medical): No    Lack of Transportation (Non-Medical): No  Physical Activity: Not on file  Stress: Not on file  Social Connections: Unknown (03/18/2022)   Social Connection and Isolation Panel    Frequency of Communication with Friends and Family: Patient  declined    Frequency of Social Gatherings with Friends and Family: Patient declined    Attends Religious Services: Patient declined    Active Member of Clubs or Organizations: Not on file    Attends Banker Meetings: Patient declined    Marital Status: Patient declined  Intimate Partner Violence: Not At Risk (03/18/2022)   Humiliation, Afraid, Rape, and Kick questionnaire    Fear of Current or Ex-Partner: No    Emotionally Abused: No    Physically Abused: No    Sexually Abused: No     Allergies  Allergen Reactions   Codeine Anaphylaxis   Penicillins Anaphylaxis   Shellfish Allergy      CBC    Component Value Date/Time   WBC 4.4 03/13/2023 1148   RBC 4.48 03/13/2023 1148   HGB 14.1 03/13/2023 1148   HCT 43.1 03/13/2023 1148   PLT 123 (L) 03/13/2023 1148   MCV 96.2 03/13/2023 1148   MCH 31.5 03/13/2023 1148   MCHC 32.7 03/13/2023 1148   RDW 13.6 03/13/2023 1148   LYMPHSABS 2.2 07/16/2022 1248   MONOABS 0.4 07/16/2022 1248   EOSABS 0.1 07/16/2022 1248   BASOSABS 0.1 07/16/2022 1248    Pulmonary Functions Testing Results:    Latest Ref Rng & Units 12/26/2022   11:44 AM  PFT Results  FVC-Pre L 2.48   FVC-Predicted Pre % 63   FVC-Post L 3.06   FVC-Predicted Post % 79   Pre FEV1/FVC % % 63   Post FEV1/FCV % % 67   FEV1-Pre L 1.56   FEV1-Predicted Pre % 51   FEV1-Post L 2.06  DLCO uncorrected ml/min/mmHg 10.54   DLCO UNC% % 44   DLVA Predicted % 58   TLC L 5.54   TLC % Predicted % 96   RV % Predicted % 140     Outpatient Medications Prior to Visit  Medication Sig Dispense Refill   albuterol  (PROVENTIL ) (2.5 MG/3ML) 0.083% nebulizer solution Take 2.5 mg by nebulization 4 (four) times daily.     aspirin 81 MG chewable tablet Chew 81 mg by mouth daily.     buPROPion (WELLBUTRIN SR) 150 MG 12 hr tablet Take 150 mg by mouth 2 (two) times daily.     clonazePAM (KLONOPIN) 1 MG tablet Take 1 mg by mouth at bedtime.     cyanocobalamin  (VITAMIN B12) 500  MCG tablet Take 500 mcg by mouth daily.     ferrous sulfate 324 MG TBEC Take 324 mg by mouth.     fluticasone -salmeterol (ADVAIR HFA) 230-21 MCG/ACT inhaler Inhale 2 puffs into the lungs 2 (two) times daily. 1 each 12   levothyroxine (SYNTHROID) 50 MCG tablet Take 50 mcg by mouth daily.     meloxicam  (MOBIC ) 15 MG tablet Take 15 mg by mouth at bedtime.     naloxone (NARCAN) nasal spray 4 mg/0.1 mL SMARTSIG:Both Nares     nicotine  (NICODERM CQ  - DOSED IN MG/24 HOURS) 14 mg/24hr patch Place 14 mg onto the skin daily.     nitroGLYCERIN  (NITROSTAT ) 0.3 MG SL tablet Place 0.3 mg under the tongue every 5 (five) minutes as needed for chest pain.     omeprazole (PRILOSEC) 40 MG capsule Take 40 mg by mouth 2 (two) times daily.     PARoxetine (PAXIL) 20 MG tablet Take 20 mg by mouth at bedtime.     pravastatin (PRAVACHOL) 10 MG tablet Take 10 mg by mouth daily.     QUEtiapine (SEROQUEL) 400 MG tablet Take 400 mg by mouth at bedtime.     sucralfate (CARAFATE) 1 GM/10ML suspension Take 2 g by mouth 3 times/day as needed-between meals & bedtime.     SUMAtriptan (IMITREX) 100 MG tablet Take 100 mg by mouth every 2 (two) hours as needed for migraine. May repeat in 2 hours if headache persists or recurs.     Tiotropium Bromide Monohydrate  (SPIRIVA  RESPIMAT) 2.5 MCG/ACT AERS Inhale 2 puffs into the lungs daily. 4 g 11   traMADol (ULTRAM) 50 MG tablet Take 50 mg by mouth 4 (four) times daily.     Facility-Administered Medications Prior to Visit  Medication Dose Route Frequency Provider Last Rate Last Admin   albuterol  (PROVENTIL ) (2.5 MG/3ML) 0.083% nebulizer solution 2.5 mg  2.5 mg Nebulization Once Chakia Counts, MD

## 2023-08-19 ENCOUNTER — Telehealth: Payer: Self-pay

## 2023-08-19 NOTE — Telephone Encounter (Signed)
 The patient dropped off a handicap placard form for Dr. Isadora to fill out.   The form has been completed and is ready to be picked up or mailed, whichever one she would prefer.  LMTCB. E2C2 please advise when patient calls back.

## 2023-08-20 NOTE — Telephone Encounter (Signed)
 I have notified the patient and placed the form in the mail.  She did also mention that she has not heard from [employer] about the POC that was ordered on 8/7.  Per Donzell, the order has been sent.  The patient asked if the order can be sent to another DME company. She does currently have an oxygen concentrator with [employer].  Marena can you find out if she can switch DME companies? She would like to go with [facility].

## 2023-08-20 NOTE — Telephone Encounter (Signed)
 She has [employer] Medicare and I bet [facility] chose [employer] for her

## 2023-08-22 ENCOUNTER — Telehealth: Payer: Self-pay

## 2023-08-22 NOTE — Telephone Encounter (Signed)
 Received [employer]  new start paperwork. Completed form and faxed with clinicals and insurance card copy to [facility]   Phone#: [phone] Fax#: [phone]

## 2023-08-25 NOTE — Telephone Encounter (Signed)
 Copied from CRM #[id number]. Topic: General - Other >> Aug 25, 2023  2:58 PM Shona S wrote: Reason for CRM: patient would like to know where is the letter she brung in fr dr isadora to sign, she still haven't received it by mail Let pt know letter would have been picked up on 8/14. Very possible it will be there today or tomorrow

## 2023-08-25 NOTE — Telephone Encounter (Signed)
 Received fax from [employer] confirming receipt of [employer]  enrollment form  Patient ID: [id number]  Sherry Pennant, PharmD, MPH, BCPS, CPP Clinical Pharmacist (Rheumatology and Pulmonology)

## 2023-08-26 NOTE — Telephone Encounter (Signed)
 Received fax from [employer] with summary of benefits. Referral form for [employer]  received. Rx will be triaged to [facility].. Once benefits investigation completed, pharmacy will reach out the patient to schedule shipment. If medication is unaffordable, patient will need to express financial hardship to be referred back to [place] [facility] for patient assistance program pre-screening.   Patient ID: [id number] Pharmacy phone: [phone] [facility] Phone#: [phone]

## 2023-08-27 NOTE — Telephone Encounter (Signed)
 Per Donzell, she spoke with the patient today and told her [facility] choose [employer] for her and she would have to call [facility] to change to a different DME. Patient will call [facility].  Nothing further needed.

## 2023-10-14 ENCOUNTER — Emergency Department

## 2023-10-14 ENCOUNTER — Other Ambulatory Visit: Payer: Self-pay

## 2023-10-14 ENCOUNTER — Emergency Department
Admission: EM | Admit: 2023-10-14 | Discharge: 2023-10-14 | Disposition: A | Discharge status: Home / Self Care | Attending: Emergency Medicine | Admitting: Emergency Medicine

## 2023-10-14 DIAGNOSIS — R9082 White matter disease, unspecified: Secondary | ICD-10-CM | POA: Insufficient documentation

## 2023-10-14 DIAGNOSIS — M25512 Pain in left shoulder: Secondary | ICD-10-CM | POA: Insufficient documentation

## 2023-10-14 DIAGNOSIS — S4992XA Unspecified injury of left shoulder and upper arm, initial encounter: Secondary | ICD-10-CM | POA: Insufficient documentation

## 2023-10-14 DIAGNOSIS — M960 Pseudarthrosis after fusion or arthrodesis: Secondary | ICD-10-CM | POA: Diagnosis not present

## 2023-10-14 DIAGNOSIS — Z9889 Other specified postprocedural states: Secondary | ICD-10-CM | POA: Diagnosis not present

## 2023-10-14 DIAGNOSIS — S0990XA Unspecified injury of head, initial encounter: Secondary | ICD-10-CM | POA: Diagnosis present

## 2023-10-14 DIAGNOSIS — S298XXA Other specified injuries of thorax, initial encounter: Secondary | ICD-10-CM

## 2023-10-14 DIAGNOSIS — S20212A Contusion of left front wall of thorax, initial encounter: Secondary | ICD-10-CM | POA: Diagnosis not present

## 2023-10-14 DIAGNOSIS — I700 Atherosclerosis of aorta: Secondary | ICD-10-CM | POA: Insufficient documentation

## 2023-10-14 DIAGNOSIS — W010XXA Fall on same level from slipping, tripping and stumbling without subsequent striking against object, initial encounter: Secondary | ICD-10-CM | POA: Diagnosis not present

## 2023-10-14 DIAGNOSIS — S29001A Unspecified injury of muscle and tendon of front wall of thorax, initial encounter: Secondary | ICD-10-CM | POA: Diagnosis present

## 2023-10-14 MED ORDER — LIDOCAINE 5 % EX PTCH
1.0000 | MEDICATED_PATCH | CUTANEOUS | Status: DC
  Administered 2023-10-14: 1 via TRANSDERMAL
  Filled 2023-10-14: qty 1

## 2023-10-14 MED ORDER — ACETAMINOPHEN 325 MG PO TABS
650.0000 mg | ORAL_TABLET | Freq: Once | ORAL | Status: AC
  Administered 2023-10-14: 650 mg via ORAL
  Filled 2023-10-14: qty 2

## 2023-10-14 NOTE — ED Provider Notes (Signed)
 [facility] Provider Note    Event Date/Time   First MD Initiated Contact with Patient 10/14/23 [phone]     (approximate)   History   Fall   HPI  Tammy Maxwell is a 63 y.o. female with a past medical history of emphysema who presents today for evaluation after a fall that occurred 5 days ago.  Patient reports that she was not pain attention to where she was walking and she tripped over something on her floor.  She reports that she kicked a wicker chair on her left side.  She also reports that she hit her head.  There is no LOC.  She reports that she hit her left ribs and her left shoulder.  She continues to have pain to her head.  She also reports that she is worried about the hardware in her neck.  She denies weakness or paresthesias in her arm.  She is not anticoagulated.  She still been able to walk.  Patient Active Problem List   Diagnosis Date Noted   Centrilobular emphysema (HCC) 12/26/2022   Personal history of tobacco use, presenting hazards to health 12/26/2022   Shortness of breath 12/26/2022   Iron  deficiency anemia 03/18/2022   B12 deficiency 03/18/2022   Neck pain of over 3 months duration 01/31/2020   Cervical spondylosis with radiculopathy 01/31/2020   Oropharyngeal dysphagia 01/27/2020          Physical Exam   Triage Vital Signs: ED Triage Vitals [10/14/23 0721]  Encounter Vitals Group     BP 108/81     Girls Systolic BP Percentile      Girls Diastolic BP Percentile      Boys Systolic BP Percentile      Boys Diastolic BP Percentile      Pulse Rate 100     Resp 20     Temp 98 F (36.7 C)     Temp Source Oral     SpO2 98 %     Weight 115 lb (52.2 kg)     Height 5' 8 (1.727 m)     Head Circumference      Peak Flow      Pain Score 8     Pain Loc      Pain Education      Exclude from Growth Chart     Most recent vital signs: Vitals:   10/14/23 0721  BP: 108/81  Pulse: 100  Resp: 20  Temp: 98 F (36.7 C)  SpO2: 98%     Physical Exam Vitals and nursing note reviewed.  Constitutional:      General: Awake and alert. No acute distress.    Appearance: Normal appearance. The patient is normal weight.  HENT:     Head: Normocephalic.  Old appearing ecchymosis to her right anterior forehead.    Mouth: Mucous membranes are moist.  Eyes:     General: PERRL. Normal EOMs        Right eye: No discharge.        Left eye: No discharge.     Conjunctiva/sclera: Conjunctivae normal.  Cardiovascular:     Rate and Rhythm: Normal rate and regular rhythm.     Pulses: Normal pulses.  Pulmonary:     Effort: Pulmonary effort is normal. No respiratory distress.     Breath sounds: Normal breath sounds.  Tenderness palpation to left lateral/posterior ribs Abdominal:     Abdomen is soft. There is no abdominal tenderness. No rebound or guarding.  No distention. Musculoskeletal:        General: No swelling. Normal range of motion.     Cervical back: Normal range of motion and neck supple. No midline cervical spine tenderness.  Full range of motion of neck.  Negative Spurling test.  Normal strength and sensation in bilateral upper extremities. Normal grip strength bilaterally.  Normal intrinsic muscle function of the hand bilaterally.  Normal radial pulses bilaterally. Left shoulder: No obvious deformity, swelling, ecchymosis, or erythema No clavicular or AC joint tenderness Able to actively and passively forward flex and abduct at shoulder fully, though pain past 90 degrees.  Negative drop arm test Mild discomfort but overall negative Obriens, SLAP, empty can, and lift off tests Normal internal and external rotation against resistance Pain with Hawkins and Neers Normal ROM at elbow and wrist Normal resisted pronation and supination 2+ radial pulse Normal grip strength Normal intrinsic hand muscle function Skin:    General: Skin is warm and dry.     Capillary Refill: Capillary refill takes less than 2 seconds.      Findings: No rash.  Neurological:     Mental Status: The patient is awake and alert.      ED Results / Procedures / Treatments   Labs (all labs ordered are listed, but only abnormal results are displayed) Labs Reviewed - No data to display   EKG     RADIOLOGY I independently reviewed and interpreted imaging and agree with radiologists findings.     PROCEDURES:  Critical Care performed:   Procedures   MEDICATIONS ORDERED IN ED: Medications  lidocaine  (LIDODERM ) 5 % 1 patch (1 patch Transdermal Patch Applied 10/14/23 0804)  acetaminophen  (TYLENOL ) tablet 650 mg (650 mg Oral Given 10/14/23 0804)     IMPRESSION / MDM / ASSESSMENT AND PLAN / ED COURSE  I reviewed the triage vital signs and the nursing notes.   Differential diagnosis includes, but is not limited to, rotator cuff injury, fracture, pneumothorax, costochondritis, contusion  Patient is awake and alert, hemodynamically stable and afebrile.  She has normal oxygen saturation of 98% on room air.  Further workup is indicated.  Given her continued headaches and head trauma, CT head obtained.  Given her neck discomfort and her previous neck surgery, CT neck obtained as well.  X-rays of her shoulder and ribs, both locations that she landed on, obtained as well.  She was treated symptomatically with Tylenol  and a Lidoderm  patch with good effect.  She has normal strength and sensation in bilateral upper extremities, normal grip strength bilaterally, do not suspect central cord syndrome.  X-rays and CT scans do not reveal any acute injuries.  I instructed the patient to follow-up with orthopedics for her shoulder pain given possibility of rotator cuff injury.  Patient understands and agrees to plan.  She was discharged in stable condition.   Patient's presentation is most consistent with acute complicated illness / injury requiring diagnostic workup.    FINAL CLINICAL IMPRESSION(S) / ED DIAGNOSES   Final diagnoses:   Rib contusion, left, initial encounter  Injury of left shoulder, initial encounter     Rx / DC Orders   ED Discharge Orders     None        Note:  This document was prepared using Dragon voice recognition software and may include unintentional dictation errors.   Nolton Denis E, PA-C 10/14/23 1304    Arlander Charleston, MD 10/14/23 1414

## 2023-10-14 NOTE — ED Triage Notes (Signed)
 Patient states she fell last Wednesday 10/08/2023, complaining of left shoulder pain.

## 2023-10-14 NOTE — ED Notes (Signed)
 See triage note Presents s/p fall last Wednesday   Having pain to left rib  area and shoulder area  Ambulates well to treatment area

## 2023-10-14 NOTE — Discharge Instructions (Signed)
 Please follow-up with orthopedic surgery regarding your shoulder.  Please return for any new, worsening, or changing symptoms or other concerns.  It was a pleasure caring for you today.

## 2023-10-15 ENCOUNTER — Telehealth: Payer: Self-pay

## 2023-10-15 NOTE — Telephone Encounter (Signed)
 I spoke with the patient and she said she did not receive the POC we placed an order for on 08/14/2023.   Donzell can you please check with [employer] and see why she has not received the POC? Thank you!

## 2023-10-15 NOTE — Telephone Encounter (Signed)
 Copied from CRM [id number]. Topic: Clinical - Order For Equipment >> Oct 15, 2023  1:49 PM Rilla B wrote: Reason for CRM: Patient calling in to request a portable oxygen concentrator for when she away she don't have to drag a heavy tank. Please call patient @ [phone].

## 2023-10-15 NOTE — Telephone Encounter (Signed)
 Order was placed on 08/14/23 for POC patient already established with [employer]. We have sent the order to [facility] but we can't get them to answer our calls

## 2023-10-17 ENCOUNTER — Ambulatory Visit: Attending: Student in an Organized Health Care Education/Training Program

## 2023-10-23 NOTE — Telephone Encounter (Signed)
 Copied from CRM #[id number]. Topic: General - Other >> Oct 23, 2023 10:42 AM Celestine FALCON wrote: Reason for CRM: Nelta with [employer] called to verify if the pt was still on oxygen. She stated she would take something in writing verifying her oxygen use at fax number [phone] and phone number is [phone].

## 2023-10-23 NOTE — Telephone Encounter (Signed)
 I have faxed to [employer] the order from 08/14/23 that was faxed to [facility]. I'm not sure if that is what they really need.

## 2023-11-04 ENCOUNTER — Other Ambulatory Visit: Payer: Self-pay | Admitting: Orthopedic Surgery

## 2023-11-04 DIAGNOSIS — S46002A Unspecified injury of muscle(s) and tendon(s) of the rotator cuff of left shoulder, initial encounter: Secondary | ICD-10-CM

## 2023-11-04 DIAGNOSIS — M25312 Other instability, left shoulder: Secondary | ICD-10-CM

## 2023-11-08 ENCOUNTER — Other Ambulatory Visit

## 2023-11-20 ENCOUNTER — Ambulatory Visit
Admission: RE | Admit: 2023-11-20 | Discharge: 2023-11-20 | Disposition: A | Discharge status: Home / Self Care | Source: Ambulatory Visit | Attending: Orthopedic Surgery | Admitting: Orthopedic Surgery

## 2023-11-20 DIAGNOSIS — S46002A Unspecified injury of muscle(s) and tendon(s) of the rotator cuff of left shoulder, initial encounter: Secondary | ICD-10-CM

## 2023-11-20 DIAGNOSIS — M25312 Other instability, left shoulder: Secondary | ICD-10-CM

## 2024-01-20 ENCOUNTER — Ambulatory Visit: Admitting: Neurology

## 2024-01-20 ENCOUNTER — Encounter: Payer: Self-pay | Admitting: Neurology
